# Patient Record
Sex: Female | Born: 1935 | Race: White | Hispanic: No | Smoking: Never smoker
Health system: Southern US, Community
[De-identification: ages and names within clinical notes are randomized; demographics above are authoritative.]

## PROBLEM LIST (undated history)

## (undated) DIAGNOSIS — Z950 Presence of cardiac pacemaker: Secondary | ICD-10-CM

## (undated) DIAGNOSIS — E049 Nontoxic goiter, unspecified: Secondary | ICD-10-CM

## (undated) DIAGNOSIS — M1990 Unspecified osteoarthritis, unspecified site: Secondary | ICD-10-CM

## (undated) DIAGNOSIS — F419 Anxiety disorder, unspecified: Secondary | ICD-10-CM

## (undated) DIAGNOSIS — M810 Age-related osteoporosis without current pathological fracture: Secondary | ICD-10-CM

## (undated) DIAGNOSIS — R011 Cardiac murmur, unspecified: Secondary | ICD-10-CM

## (undated) DIAGNOSIS — Z7901 Long term (current) use of anticoagulants: Secondary | ICD-10-CM

## (undated) DIAGNOSIS — I499 Cardiac arrhythmia, unspecified: Secondary | ICD-10-CM

## (undated) DIAGNOSIS — H353210 Exudative age-related macular degeneration, right eye, stage unspecified: Secondary | ICD-10-CM

## (undated) DIAGNOSIS — Z9289 Personal history of other medical treatment: Secondary | ICD-10-CM

## (undated) DIAGNOSIS — H353120 Nonexudative age-related macular degeneration, left eye, stage unspecified: Secondary | ICD-10-CM

## (undated) DIAGNOSIS — J3489 Other specified disorders of nose and nasal sinuses: Secondary | ICD-10-CM

## (undated) DIAGNOSIS — I359 Nonrheumatic aortic valve disorder, unspecified: Secondary | ICD-10-CM

## (undated) DIAGNOSIS — H3530 Unspecified macular degeneration: Secondary | ICD-10-CM

## (undated) DIAGNOSIS — I495 Sick sinus syndrome: Secondary | ICD-10-CM

## (undated) DIAGNOSIS — K219 Gastro-esophageal reflux disease without esophagitis: Secondary | ICD-10-CM

## (undated) DIAGNOSIS — I340 Nonrheumatic mitral (valve) insufficiency: Secondary | ICD-10-CM

## (undated) DIAGNOSIS — C4490 Unspecified malignant neoplasm of skin, unspecified: Secondary | ICD-10-CM

## (undated) DIAGNOSIS — E039 Hypothyroidism, unspecified: Secondary | ICD-10-CM

## (undated) DIAGNOSIS — M353 Polymyalgia rheumatica: Secondary | ICD-10-CM

## (undated) DIAGNOSIS — I4891 Unspecified atrial fibrillation: Secondary | ICD-10-CM

## (undated) DIAGNOSIS — M069 Rheumatoid arthritis, unspecified: Secondary | ICD-10-CM

## (undated) DIAGNOSIS — G5600 Carpal tunnel syndrome, unspecified upper limb: Secondary | ICD-10-CM

## (undated) DIAGNOSIS — I10 Essential (primary) hypertension: Secondary | ICD-10-CM

## (undated) DIAGNOSIS — E785 Hyperlipidemia, unspecified: Secondary | ICD-10-CM

## (undated) HISTORY — PX: INSERT / REPLACE / REMOVE PACEMAKER: SUR710

## (undated) HISTORY — DX: Other disorders of calcium metabolism: E83.59

## (undated) HISTORY — PX: DILATION AND CURETTAGE OF UTERUS: SHX78

## (undated) HISTORY — PX: CARDIAC CATHETERIZATION: SHX172

## (undated) HISTORY — DX: Nonrheumatic mitral (valve) insufficiency: I34.0

## (undated) HISTORY — DX: Carpal tunnel syndrome, unspecified upper limb: G56.00

## (undated) HISTORY — PX: SKIN CANCER EXCISION: SHX779

## (undated) HISTORY — DX: Unspecified macular degeneration: H35.30

## (undated) HISTORY — PX: TOTAL KNEE ARTHROPLASTY: SHX125

## (undated) HISTORY — PX: FRACTURE SURGERY: SHX138

## (undated) HISTORY — PX: JOINT REPLACEMENT: SHX530

## (undated) HISTORY — DX: Polymyalgia rheumatica: M35.3

## (undated) HISTORY — DX: Age-related osteoporosis without current pathological fracture: M81.0

## (undated) HISTORY — DX: Unspecified atrial fibrillation: I48.91

## (undated) HISTORY — DX: Nontoxic goiter, unspecified: E04.9

## (undated) HISTORY — PX: CARPAL TUNNEL RELEASE: SHX101

## (undated) HISTORY — DX: Hypothyroidism, unspecified: E03.9

## (undated) HISTORY — DX: Sick sinus syndrome: I49.5

## (undated) HISTORY — DX: Unspecified osteoarthritis, unspecified site: M19.90

## (undated) HISTORY — DX: Hyperlipidemia, unspecified: E78.5

## (undated) HISTORY — PX: TONSILLECTOMY AND ADENOIDECTOMY: SUR1326

## (undated) HISTORY — PX: CATARACT EXTRACTION W/ INTRAOCULAR LENS  IMPLANT, BILATERAL: SHX1307

## (undated) HISTORY — DX: Long term (current) use of anticoagulants: Z79.01

## (undated) HISTORY — DX: Essential (primary) hypertension: I10

## (undated) HISTORY — PX: FEMUR FRACTURE SURGERY: SHX633

## (undated) HISTORY — DX: Nonrheumatic aortic valve disorder, unspecified: I35.9

---

## 1998-03-04 ENCOUNTER — Ambulatory Visit (HOSPITAL_COMMUNITY): Admission: RE | Admit: 1998-03-04 | Discharge: 1998-03-04 | Payer: Self-pay | Admitting: Internal Medicine

## 1999-03-25 ENCOUNTER — Other Ambulatory Visit: Admission: RE | Admit: 1999-03-25 | Discharge: 1999-03-25 | Payer: Self-pay | Admitting: Internal Medicine

## 1999-06-01 ENCOUNTER — Ambulatory Visit (HOSPITAL_COMMUNITY): Admission: RE | Admit: 1999-06-01 | Discharge: 1999-06-01 | Payer: Self-pay | Admitting: Internal Medicine

## 1999-06-01 ENCOUNTER — Encounter: Payer: Self-pay | Admitting: Internal Medicine

## 2000-06-01 ENCOUNTER — Encounter: Payer: Self-pay | Admitting: Internal Medicine

## 2000-06-01 ENCOUNTER — Ambulatory Visit (HOSPITAL_COMMUNITY): Admission: RE | Admit: 2000-06-01 | Discharge: 2000-06-01 | Payer: Self-pay | Admitting: Internal Medicine

## 2001-06-05 ENCOUNTER — Encounter: Payer: Self-pay | Admitting: Internal Medicine

## 2001-06-05 ENCOUNTER — Ambulatory Visit (HOSPITAL_COMMUNITY): Admission: RE | Admit: 2001-06-05 | Discharge: 2001-06-05 | Payer: Self-pay | Admitting: Internal Medicine

## 2002-08-01 ENCOUNTER — Encounter: Payer: Self-pay | Admitting: Internal Medicine

## 2002-08-01 ENCOUNTER — Ambulatory Visit (HOSPITAL_COMMUNITY): Admission: RE | Admit: 2002-08-01 | Discharge: 2002-08-01 | Payer: Self-pay | Admitting: Internal Medicine

## 2003-08-21 ENCOUNTER — Encounter: Payer: Self-pay | Admitting: Internal Medicine

## 2003-08-21 ENCOUNTER — Ambulatory Visit (HOSPITAL_COMMUNITY): Admission: RE | Admit: 2003-08-21 | Discharge: 2003-08-21 | Payer: Self-pay | Admitting: Internal Medicine

## 2004-07-21 ENCOUNTER — Emergency Department (HOSPITAL_COMMUNITY): Admission: EM | Admit: 2004-07-21 | Discharge: 2004-07-21 | Payer: Self-pay | Admitting: *Deleted

## 2004-08-26 ENCOUNTER — Ambulatory Visit (HOSPITAL_COMMUNITY): Admission: RE | Admit: 2004-08-26 | Discharge: 2004-08-26 | Payer: Self-pay | Admitting: Internal Medicine

## 2005-08-11 ENCOUNTER — Ambulatory Visit (HOSPITAL_COMMUNITY): Admission: RE | Admit: 2005-08-11 | Discharge: 2005-08-11 | Payer: Self-pay | Admitting: Internal Medicine

## 2006-08-17 ENCOUNTER — Ambulatory Visit (HOSPITAL_COMMUNITY): Admission: RE | Admit: 2006-08-17 | Discharge: 2006-08-17 | Payer: Self-pay | Admitting: General Surgery

## 2007-04-05 ENCOUNTER — Ambulatory Visit: Payer: Self-pay | Admitting: Vascular Surgery

## 2007-04-05 ENCOUNTER — Ambulatory Visit (HOSPITAL_COMMUNITY): Admission: RE | Admit: 2007-04-05 | Discharge: 2007-04-05 | Payer: Self-pay | Admitting: Internal Medicine

## 2007-04-05 ENCOUNTER — Encounter (INDEPENDENT_AMBULATORY_CARE_PROVIDER_SITE_OTHER): Payer: Self-pay | Admitting: Internal Medicine

## 2007-05-02 ENCOUNTER — Encounter (INDEPENDENT_AMBULATORY_CARE_PROVIDER_SITE_OTHER): Payer: Self-pay | Admitting: Internal Medicine

## 2007-05-02 ENCOUNTER — Ambulatory Visit: Payer: Self-pay | Admitting: Vascular Surgery

## 2007-05-02 ENCOUNTER — Ambulatory Visit (HOSPITAL_COMMUNITY): Admission: RE | Admit: 2007-05-02 | Discharge: 2007-05-02 | Payer: Self-pay | Admitting: Internal Medicine

## 2007-08-28 ENCOUNTER — Inpatient Hospital Stay (HOSPITAL_COMMUNITY): Admission: RE | Admit: 2007-08-28 | Discharge: 2007-08-31 | Payer: Self-pay | Admitting: Orthopaedic Surgery

## 2007-08-29 ENCOUNTER — Ambulatory Visit: Payer: Self-pay | Admitting: Physical Medicine & Rehabilitation

## 2008-01-03 ENCOUNTER — Ambulatory Visit (HOSPITAL_COMMUNITY): Admission: RE | Admit: 2008-01-03 | Discharge: 2008-01-03 | Payer: Self-pay | Admitting: Endocrinology

## 2008-03-12 ENCOUNTER — Ambulatory Visit (HOSPITAL_COMMUNITY): Admission: RE | Admit: 2008-03-12 | Discharge: 2008-03-12 | Payer: Self-pay | Admitting: Cardiology

## 2008-03-15 ENCOUNTER — Inpatient Hospital Stay (HOSPITAL_COMMUNITY): Admission: EM | Admit: 2008-03-15 | Discharge: 2008-03-22 | Payer: Self-pay | Admitting: Emergency Medicine

## 2008-03-17 ENCOUNTER — Encounter (INDEPENDENT_AMBULATORY_CARE_PROVIDER_SITE_OTHER): Payer: Self-pay | Admitting: Cardiology

## 2008-07-13 ENCOUNTER — Inpatient Hospital Stay (HOSPITAL_COMMUNITY): Admission: EM | Admit: 2008-07-13 | Discharge: 2008-07-16 | Payer: Self-pay | Admitting: Emergency Medicine

## 2008-07-15 ENCOUNTER — Ambulatory Visit: Payer: Self-pay | Admitting: Physical Medicine & Rehabilitation

## 2008-07-16 ENCOUNTER — Inpatient Hospital Stay (HOSPITAL_COMMUNITY)
Admission: RE | Admit: 2008-07-16 | Discharge: 2008-07-23 | Payer: Self-pay | Admitting: Physical Medicine & Rehabilitation

## 2009-02-03 ENCOUNTER — Encounter: Admission: RE | Admit: 2009-02-03 | Discharge: 2009-02-03 | Payer: Self-pay | Admitting: Diagnostic Radiology

## 2009-02-06 ENCOUNTER — Inpatient Hospital Stay (HOSPITAL_COMMUNITY): Admission: RE | Admit: 2009-02-06 | Discharge: 2009-02-07 | Payer: Self-pay | Admitting: Cardiology

## 2009-07-14 IMAGING — CR DG CHEST 1V PORT
1 series · 1 of 1 positions shown · non-contrast
Comparison: 08/22/2007

CLINICAL DATA: Chest pain and shortness of breath.

PORTABLE CHEST - 1 VIEW

[AP]
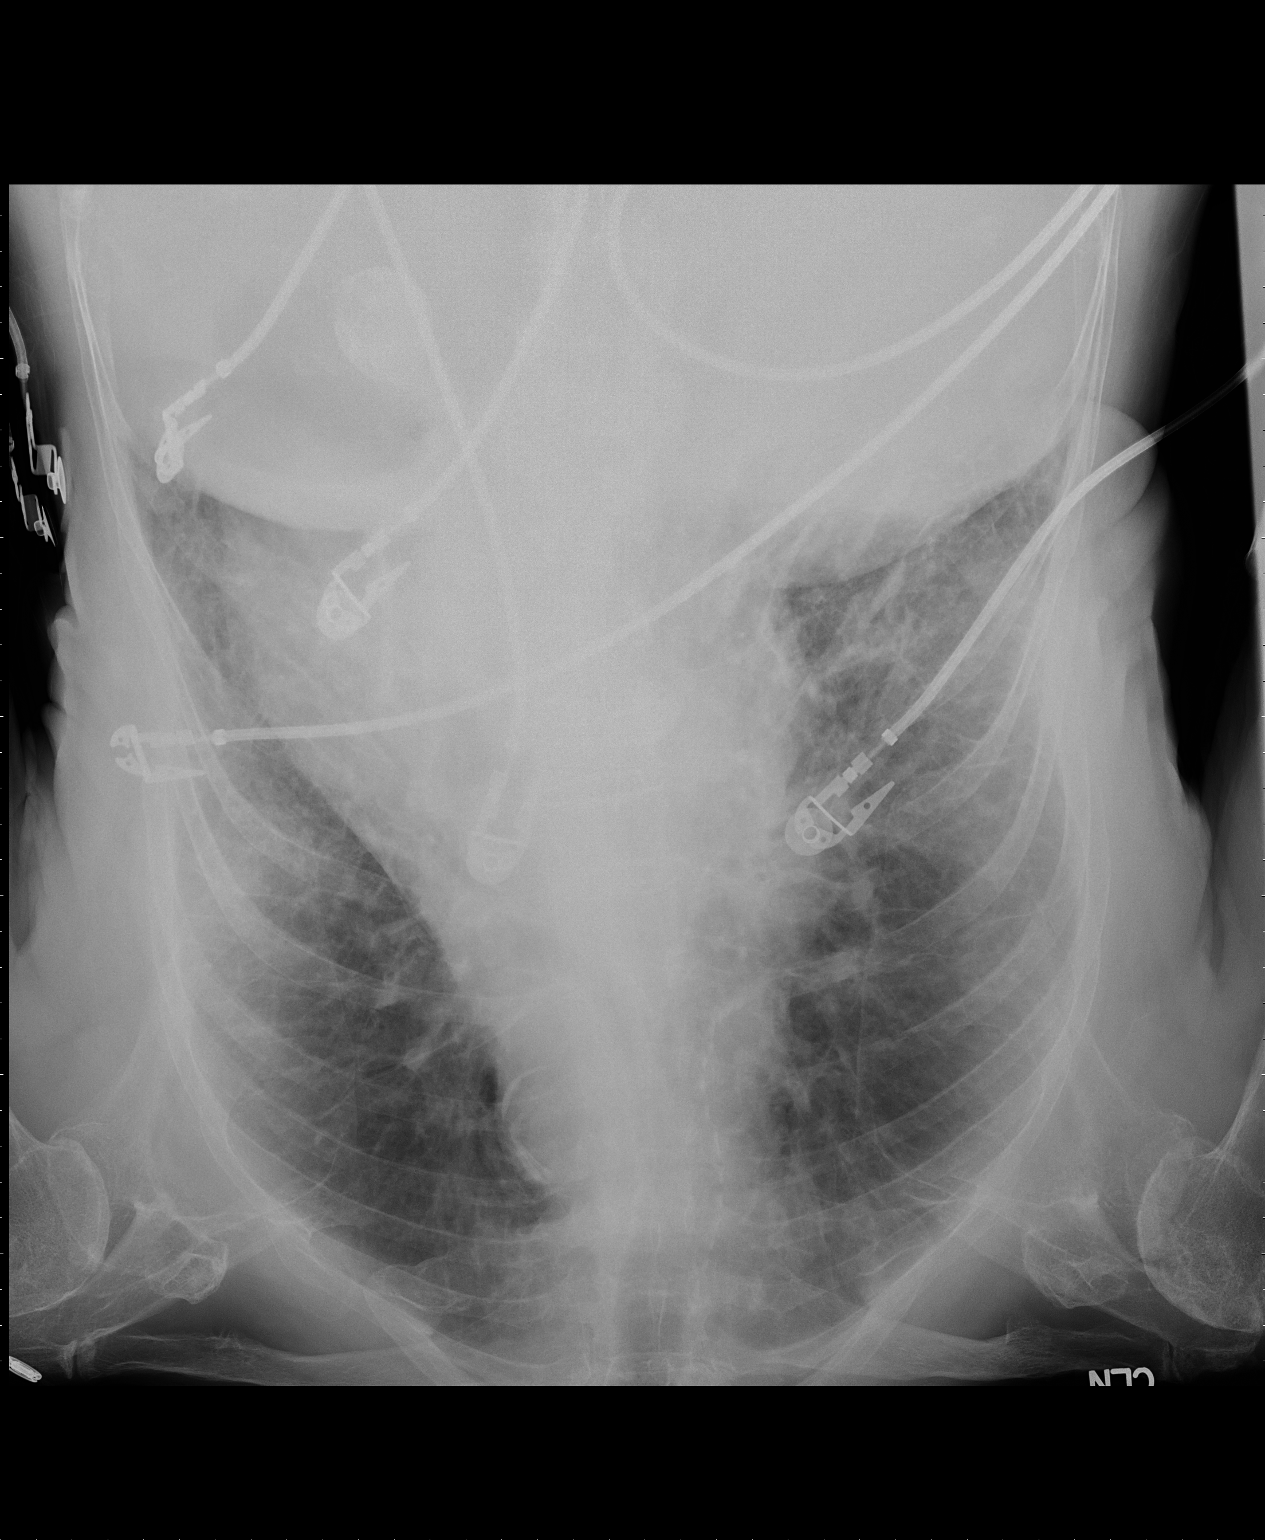

[1 of 1 positions shown; findings below may reference images not displayed]

FINDINGS: The cardiopericardial silhouette is enlarged. Diffuse
interstitial and basilar airspace disease is evident.  Bones are
diffusely demineralized with degenerative changes in each shoulder.
Telemetry leads overlie the chest.

2.7 cm calcification overlies the left upper quadrant.  This is
probably related to splenic artery aneurysm.  Calcified mass in the
tail of the pancreas would also be a consideration.  Less likely
would be a calcified splenic cyst or calcified cyst in the upper
pole of the left kidney.
IMPRESSION: Cardiomegaly.  There has been interval development of diffuse
interstitial and basilar airspace disease.  Pulmonary edema would
be a distinct consideration.

Stable 2.7 cm calcification in the left upper quadrant of the
abdomen.  See report above.

## 2011-02-09 LAB — PROTIME-INR
INR: 1.3 (ref 0.00–1.49)
Prothrombin Time: 16.3 seconds — ABNORMAL HIGH (ref 11.6–15.2)

## 2011-03-15 NOTE — H&P (Signed)
NAME:  Patricia Holt, Patricia Holt             ACCOUNT NO.:  [id number]   MEDICAL RECORD NO.:  [id number]          PATIENT TYPE:  EMS   LOCATION:  MAJO                         FACILITY:  [facility]   PHYSICIAN:  Corinna L. Lendell Caprice, MDDATE OF BIRTH:  03-07-1924   DATE OF ADMISSION:  07/13/2008  DATE OF DISCHARGE:                              HISTORY & PHYSICAL   CHIEF COMPLAINT:  Shortness of breath.   HISTORY OF PRESENT ILLNESS:  Patricia Holt is a pleasant, very active  77 year old white female who was on vacation at the [facility], tripped  and sustained a fracture of her femur.  She had an ORIF of the femur at  the [facility] on Tuesday, I believe, of this week.  She is on  Coumadin for atrial fibrillation, had received FFP and also apparently  packed red blood cells.  She has a history of also coronary artery  disease with ejection fraction of 45%.  She reports that her shortness  of breath began this morning at [facility].  She  had just arrived there yesterday for rehabilitation.  The shortness of  breath is worse supine.  This is new for her.  She had no chest pain.  She has developed a dry cough over the past week or so.  She has had no  fevers or chills.  Her appetite has been good.  She had no chest pain.  She remains on Coumadin.  She has chronic low back pain which  essentially is unchanged from previous.  She does have osteoporosis.  She was sent to the emergency room for workup of her dyspnea.  Her  family also requests that she not go back to [place].   PAST MEDICAL HISTORY:  1. As above, also non-ST segment elevation MI in May 2009.  2. Atrial fibrillation.  3. Hypothyroidism.  4. Hypertension.  5. Right knee replacement by Dr. Jerl Santos in December 2008.   MEDICATIONS:  1. Coumadin 5 mg a day.  2. Multivitamin a day.  3. Boniva monthly.  4. Tylenol as needed.  5. Calcium with vitamin D.  6. Colace.  7. Aspirin 81 mg a day.  8. Lasix 20  mg a day.  9. Synthroid 25 mcg a day.  10.Lisinopril 2.5 mg a day.  11.Metoprolol 25 mg twice a day.  12.Percocet as needed.   No known drug allergies.   SOCIAL HISTORY:  Patient is very active.  She is a retired Engineer, civil (consulting).  She  drinks an occasional glass of wine.  She does not smoke.   FAMILY HISTORY:  Noncontributory.   REVIEW OF SYSTEMS:  CONSTITUTIONAL:  As above.  No weight loss, no  weight gain.  HEENT:  No headache.  No sore throat.  No rhinorrhea.  RESPIRATORY:  As above.  CARDIOVASCULAR:  As above.  GI:  As above.  GU:  She reports difficulty with incontinence since her femur fracture mainly  related to mobility problems.  SKIN:  No rash.  MUSCULOSKELETAL:  As  above.  PSYCHIATRIC:  No depression.  NEUROLOGIC:  No history of stroke  or seizure.  ENDOCRINE:  No diabetes.   PHYSICAL EXAMINATION:  VITAL SIGNS:  Temperature is 97.1, blood pressure  149/74, pulse 79, respiratory rate 18, oxygen saturation 99% on room  air.  IN GENERAL:  The patient is alert white female who appears younger than  her stated age.  HEENT: Normocephalic, atraumatic.  Pupils equal, round, reactive to  light.  Sclerae nonicteric.  Moist mucous membranes.  NECK:  Supple.  No JVD.  No lymphadenopathy.  LUNGS:  Clear to auscultation bilaterally without wheezes, rhonchi or  rales.  She has an occasional dry cough.  CARDIOVASCULAR:  Regular rate  and rhythm without murmurs, gallops or rubs.  ABDOMEN:  Normal bowel sounds, soft, nontender, nondistended.  GU: She has a Foley catheter draining clear yellow urine.  RECTAL:  Deferred.  EXTREMITIES:  Her left leg incisions show staples.  There is no  drainage.  No erythema.  She has trace edema bilaterally.  Pulses are  intact.  NEUROLOGIC:  Alert and oriented.  Cranial nerves and sensorimotor exam  are grossly intact.  PSYCHIATRIC:  Normal affect.   LABORATORIES/IMAGING STUDIES:  CBC is significant for a normal white  count, hemoglobin 10.5, hematocrit  31, normal platelet count.  INR is  2.4; PTT is 48.  Complete metabolic panel is significant for an albumin  of 2.8, otherwise unremarkable.  Point-of-care enzymes negative.  Urinalysis negative.  Chest x-ray:  Two views of the chest show nothing  acute, trace bilateral small pleural effusions, chronic mild  interstitial prominence.  CT angiogram of the chest showed no PE, but  pulmonary parenchymal disease present characterized by interstitial and  ground-glass opacities scattered throughout both upper lobes; nodules  are also present in both upper lobes, largest measuring 7 mm, neoplasm  not excluded, small bilateral pleural effusions, thoracic wedge  compression deformities and L1 compression deformity with possible acute  appearance of the T10 fracture with 1-2 mm of retropulsion, large fat  mass in the right posterior chest wall.  EKG shows normal sinus rhythm  with nonspecific changes.  MRI ordered by the ED physician is pending.  This is of the lower thoracic and I believe upper lumbar spine.   ASSESSMENT/PLAN:  1. Dyspnea:  I suspect this may be mild fluid overload based on      symptoms and her medical history.  I will increase her Lasix and      repeat the chest x-ray tomorrow.  However, also, she does have a      cough, and this could be atypical pneumonia.  I will start her on      Avelox..  Oxygen seems to have helped and I will continue this.      She will also get [employer] as needed.  2. Paroxysmal atrial fibrillation, therapeutic on Coumadin.  3. Recent femur fracture:  The patient reports that she was to be 50%      weightbearing on the left leg.  I will get physical therapy,      occupational therapy, and she requests an alternate placement      option whether it be to a different facility or home with home      health, etc.  She also wishes to get Dr. Nolon Nations input if there      are any questions.  4. Possible T10 fracture on CAT scan:  The patient has no  tenderness      or bruising about the area.  The emergency department physician has  spoken with Dr. Yetta Barre of neurosurgery who has reviewed the film,      and he recommended MRI.  5. Multiple vertebral compression fractures, old.  6. Abnormal CAT scan--see above.  Consider repeat CAT scan of the      chest in 3 months after resolution of her symptoms to further      evaluate the nodules.  7. Hypothyroidism.  8. Coronary artery disease and ejection fraction of 45%.  9. Atrial fibrillation.  10.Osteoporosis.      Corinna L. Lendell Caprice, MD  Electronically Signed     CLS/MEDQ  D:  07/13/2008  T:  07/13/2008  Job:  [id number]   cc:   Thora Lance, M.D.  Jake Bathe, MD  Lubertha Basque. Jerl Santos, M.D.

## 2011-03-15 NOTE — Discharge Summary (Signed)
NAME:  Patricia Holt, Patricia Holt             ACCOUNT NO.:  [id number]   MEDICAL RECORD NO.:  [id number]          PATIENT TYPE:  IPS   LOCATION:  4001                         FACILITY:  [facility]   PHYSICIAN:  Ranelle Oyster, M.D.DATE OF BIRTH:  May 16, 1924   DATE OF ADMISSION:  07/16/2008  DATE OF DISCHARGE:  07/23/2008                               DISCHARGE SUMMARY   DISCHARGE DIAGNOSES:  1. Left femur fracture with open reduction and external fixation      July 08, 2008.  2. Thoracic T10 compression fracture, pain management.  3. Chronic atrial fibrillation, on Coumadin therapy.  4. Hypertension.  5. Enterococcus urinary tract infection.  6. Hypothyroidism.   This is an 77 year old female with history of right total knee  replacement in 2008, maintained on chronic Coumadin for atrial  fibrillation, recent fall while on vacation at the [facility] sustained  a left femur fracture, underwent open reduction and external fixation on  July 08, 2008, at the [employer].  She was later discharged to  [facility]  She developed shortness of  breath, admitted to [facility] on July 13, 2008.  Chest x-  ray negative.  CT of the chest negative for pulmonary emboli.  There  was, however, findings of an acute versus subacute compression fracture  of thoracic T10, no cord compromise noted as well as old healed thoracic  T9 compression fracture.  Hospital course of enterococcus urinary tract  infection, completing antibiotic.  Mobility slow with 50% partial  weightbearing left lower extremity.  Avelox discontinued, changed to  Cipro for urinary tract infection, which was since completed.  She was  admitted for comprehensive rehab program.   PAST MEDICAL HISTORY:  See discharge diagnoses.  No tobacco.  Occasional  alcohol.   ALLERGIES:  None.   SOCIAL HISTORY:  Lives alone, retired Engineer, civil (consulting).  Family will play  assistance as needed.  She lives in a  1-level home with 1 step to entry.   FUNCTIONAL HISTORY:  She was independent and active prior to admission.  Functional status upon admission to rehab was moderate assist 3 feet  with a rolling walker.   MEDICATIONS PRIOR TO ADMISSION:  1. Coumadin 5 mg daily.  2. Multivitamin daily.  3. Boniva monthly.  4. Calcium daily.  5. Aspirin 81 mg daily.  6. Lasix 20 mg daily.  7. Synthroid 25 mcg daily.  8. Lisinopril 2.5 mg daily.  9. Metoprolol 25 mg twice daily.  10.Percocet as needed.   PHYSICAL EXAMINATION:  GENERAL:  Well-appearing female in no acute  distress.  Alert and oriented.  LUNGS: Clear to auscultation.  CARDIAC:  Regular rate and rhythm.  ABDOMEN: Soft, nontender.  Good bowel sounds.  EXTREMITIES:  Left femur site was healing nicely with staples intact.   HOSPITAL COURSE:  The patient was admitted to [facility]  with therapies initiated on a 3-hour daily basis consisting of physical  therapy, occupational therapy, and rehabilitation nursing.  The  following issues were addressed during the patient's rehabilitation  stay.  Pertaining to Mrs. Favaro's left femur fracture, she had  undergone open reduction and external fixation on July 08, 2008.  She was partial weightbearing.  Surgical site healing nicely.  Staples  have been removed.  She would follow up with Dr. Jerl Santos here locally  in [place] as the fall had initially been sustained in the [employer] on vacation.  Conservative care of thoracic T10 compression  fracture.  She was using a Lidoderm patch and oxycodone for pain  management.  She remained on chronic Coumadin for atrial fibrillation,  followed by Dr. Anne Fu.  Her cardiac rate remained well-controlled.  Blood pressures monitored on lisinopril and Lopressor with no  orthostatic changes.  She exhibited no signs of fluid overload.  She had  a history of hypothyroidism, remained on hormone supplement.   Overall, weekly  interdisciplinary team conferences were held to discuss  the patient's estimated length of stay, goals, any barriers to  discharge, and family teaching being completed.  Functionally, she was  ambulating household distances, partial weightbearing with a walker, and  needing minimal assist for lower body activities of daily living.  Home  health therapies would be ongoing as advised.   Latest labs showed an INR of 2.0 with goal of 2.0-3.0.  Latest  hemoglobin 99, hematocrit 29.4, sodium 136, potassium 3.6, BUN 10,  creatinine 0.6.   Discharge medications at the time of dictation included Coumadin latest  dose of 5 mg daily, aspirin 81 mg daily, calcium 500 mg 3 times daily,  Lasix 20 mg daily, lisinopril 2.5 mg daily, Lopressor 25 mg twice daily,  multivitamin daily, calcitonin nasal spray 1 spray daily, alternating  nostrils, Lidoderm patch 5% remove every 12 hours, dispense of 10  patches, Synthroid 25 mcg daily, oxycodone immediate release 5 mg 1-2  tablets every 4 hours needed pain, dispense of 90 tablets.  Diet was  regular.  She was partial weightbearing left lower extremity.  Home  health nurse would be arranged for Coumadin therapy with results to Dr.  Anne Fu.  She would follow up locally with Dr. Jerl Santos of Orthopedic  Services.      Mariam Dollar, P.A.      Ranelle Oyster, M.D.  Electronically Signed   DA/MEDQ  D:  07/23/2008  T:  07/23/2008  Job:  [id number]   cc:   Ranelle Oyster, M.D.  Thora Lance, M.D.  Jake Bathe, MD  Lubertha Basque. Jerl Santos, M.D.

## 2011-03-15 NOTE — H&P (Signed)
NAME:  Patricia Holt, Patricia Holt             ACCOUNT NO.:  [id number]   MEDICAL RECORD NO.:  [id number]          PATIENT TYPE:  IPS   LOCATION:  4001                         FACILITY:  [facility]   PHYSICIAN:  Ranelle Oyster, M.D.DATE OF BIRTH:  Aug 10, 1924   DATE OF ADMISSION:  07/16/2008  DATE OF DISCHARGE:                              HISTORY & PHYSICAL   CHIEF COMPLAINT:  Back and left hip pain.   HISTORY OF PRESENT ILLNESS:  This is a pleasant 77 year old white female  with right total knee replacement in 2008, on Coumadin for chronic  atrial fibrillation who presented with a recent fall while on vacation  at the [employer].  She sustained a left femur fracture and underwent  ORIF on July 08, 2008, at [facility].  She was discharged to  [facility] where she developed shortness of  breath and was admitted to [facility]  CT was negative for  pulmonary and pulmonary embolism, although she did have thoracic spine  findings notable for acute or subacute compression fracture at T10.  No  neurological compromise was seen.  The patient's urine was positive for  100,000 enterococcus and placed on Avelox at admission.  The patient has  had slow progress overall due to pain issues and weakness.  Avelox was  changed to Cipro for UTI treatments.  The patient was examined by Rehab  Consult Service per Dr. Jone Baseman request and rehab felt she was  appropriate for admission for comprehensive rehab.   REVIEW OF SYSTEMS:  Notable for low back pain and history of  palpitations, although nothing new.  Bowel continence has been intact.  She does report occasional urge incontinence.  The patient denies  shortness of breath.  Appetite has been good.  She slept well last night  after using pain medicines.  Full reviews in the written history and  physical.   PAST MEDICAL HISTORY:  1. CAF.  2. CAD with ejection fraction of 45%.  3. Hypertension.  4. Hypothyroidism.  5.  Osteoporosis.  6. Left femur fracture as noted above.  7. Right total knee replacement in 2008.   SOCIAL HISTORY:  She occasionally drinks and does not smoke.   FAMILY HISTORY:  Noncontributory.   SOCIAL HISTORY:  The patient lives alone, is a retired Engineer, civil (consulting).  Family  will assist upon discharge as needed.  She has a one-level house with  one step to enter.   FUNCTIONAL HISTORY:  The patient was independent and active prior to  arrival.  Currently, she is mod assist 5 feet with only walker and min  assist with bed mobility.  She is min to mod assist with ADLs.   ALLERGIES:  None.   HOME MEDICATIONS:  1. Coumadin 5 mg daily.  2. Multivitamins daily.  3. Boniva q. month.  4. Calcium daily.  5. Aspirin 81 mg daily.  6. Lasix 20 mg daily.  7. Synthroid 25 mcg daily.  8. Lisinopril 2.5 daily.  9. Metoprolol 25 mg b.i.d.  10.Percocet p.r.n.   LABORATORY DATA:  Hemoglobin 9.7, white count 6000, and platelet  348,000.  Sodium 138, potassium 3.8, BUN and creatinine 7 and 0.6.   PHYSICAL EXAMINATION:  VITAL SIGNS:  Blood pressure is 143/79, pulse 84,  respiratory rate 18, and temperature 98.1.  GENERAL:  The patient is pleasant, alert, and oriented x3.  She is lying  in bed comfortably.  Pupils equal, round, and reactive to light.  EAR, NOSE, AND THROAT:  Notable for pink mucosa and reasonably good  dentition.  NECK:  Supple without JVD or lymphadenopathy.  CHEST:  Clear to auscultation bilaterally without wheezes, rales, or  rhonchi.  HEART:  Regular rate and rhythm.  No murmur, rubs, or gallops were  appreciated.  EXTREMITIES:  No clubbing, cyanosis, or edema.  ABDOMEN:  Soft and nontender.  SKIN:  Notable for intact left hip wounds, which were well-approximated.  She had a few bruises otherwise, but nothing significant note.  She had  an area of bogginess on the left heel with mild tenderness, but no  breakdown.  NEUROLOGIC:  Cranial nerves II-XII are intact.  Her reflex is  1+.  Sensation is normal.  Judgment, orientation, memory, and mood were all  within functional limits.  EXTREMITIES:  Strength is 5/5 in the upper extremities.  Left lower  extremities; 1/5 at the hip, 2/5 in the knee, 3+ to 4/5 at the ankle.  She is 3+ to 4/5, proximal right lower extremity to 5/5, distally.   ASSESSMENT AND PLAN:  1. Functional deficits secondary to left femur fracture, status post      open reduction and internal fixation on July 08, 2008.  Course      was complicated by T10 compression fracture and urinary tract      infection.  The patient is admitted to receive collaborative      interdisciplinary care between the physiatrist rehab nursing staff      and therapy team.  The patient's level of medical complexity and      substantial therapy needs in context with the medical necessity      cannot be provided  in a lesser intensity of care (the patient      already had complications at a local skilled nursing facility).      Physiatrist will provide 24-hour management of listed medical needs      as well as oversight of therapy, plan/treatment and provide      guidance as appropriate regarding the interaction of the tube.  24-      hour rehab nursing will assist in the management of the patient's      bowel and bladder continence.  Follow PVRs for history of urge      incontinence.  Also, RN will assist in pain management and safety      assessment.  Nurses will also help integrate therapy concepts,      techniques, and education.  Physical therapy will assess and treat      for balance needs in the setting of her new left hip fracture.  The      patient will wear a lumbar and thoracic corset while up for      comfort.  She will need to work on posture as well.  Occupational      therapy will assess and treat for self care and safety matters as      well as appropriate equipment.  Case Management/social worker will      assess for psychosocial needs and discharge  planning.  Team      conference will be  held weekly to establish goals, assess progress,      and determining barriers of discharge.  Rehab goals are modified      independent and estimated length of stay is 7+ days.  Prognosis      good.  2. Pain control with oxycodone IR p.r.n.  She also may use Tylenol.      Add Lidoderm patches for localized thoracic pain.  Use corset as      noted above.  Also, consider a light muscle relaxants for spasm.  3. Chronic atrial fibrillation:  Continue Coumadin.  4. Anemia:  We will followup serial CBCs while on Coumadin and the      supplement with iron.  5. Osteoporosis:  Continue Miacalcin as well as Os-Cal for acute care.  6. Blood pressure, heart rate control:  Continue lisinopril and      Lopressor for now.  7. Urinary tract infection:  Continue Cipro 200 mg b.i.d. for five      days.  8. Hypothyroidism:  Continue Synthroid.      Ranelle Oyster, M.D.  Electronically Signed     ZTS/MEDQ  D:  07/16/2008  T:  07/16/2008  Job:  [id number]

## 2011-03-15 NOTE — H&P (Signed)
NAME:  Patricia Holt, Patricia Holt             ACCOUNT NO.:  [id number]   MEDICAL RECORD NO.:  [id number]          PATIENT TYPE:  INP   LOCATION:  3707                         FACILITY:  [facility]   PHYSICIAN:  Jake Bathe, MD      DATE OF BIRTH:  [birth year]   DATE OF ADMISSION:  03/15/2008  DATE OF DISCHARGE:                              HISTORY & PHYSICAL   PRIMARY CARE PHYSICIAN:  Thora Lance, M.D.   CARDIOLOGIST:  Mark C. Skains, M.D.   CHIEF COMPLAINT:  Chest pain and shortness of breath.   HPI:  An 77 year old female who at 2 a.m. this morning, had trouble  breathing, felt wheezing and audible, and substernal chest pressure up  to 7/10 waxing and waning with radiation to her shoulder with some mild  dizziness, diaphoresis, and nausea.  Her neighbor Dot came to help her.  She did take aspirin.  Finally, EMS was called and took her to [facility] for further evaluation.  Symptoms were  relieved slightly with nitroglycerin.  There were no exacerbating  factors.  She thought originally that she was developing a pneumonia  because she did have some chills with her shortness of breath.   Earlier this week, she underwent a cardioversion for atrial  fibrillation, which was successful after greater than 4 weeks of INR  therapy greater than 2.  Prior echocardiogram showed normal ejection  fraction.   Yesterday, she felt recently well and actually was gardening.  She  denies any bleeding, any syncope, no palpitations, no joint pain, or no  rashes.   PAST MEDICAL HISTORY:  1. Hypothyroidism.  2. Atrial fibrillation, status post cardioversion.  Atrial      fibrillation began approximately 2 months ago following      colonoscopy.  No prior history of coronary artery disease.  3. Osteoporosis.  4. Hypertension.   PAST MEDICAL HISTORY:  She had left knee surgery.   ALLERGIES:  No known drug allergies.   MEDICATIONS:  1,  Hydrochlorothiazide 25 mg once a day.  1.  Metoprolol 25 mg twice a day.  2. Synthroid 25 mcg once a day.  3. Boniva 150 mg a week.  4. Warfarin 5 mg as directed by Dr. Riki Rusk.  5. Multivitamin, stool softener, calcium, and vitamin D.   FAMILY HISTORY:  Currently noncontributory, but her parents lived to  their 7s.   SOCIAL HISTORY:  She is a retired Engineer, civil (consulting), enjoys Multimedia programmer and quite  active.  No tobacco or alcohol use recently, since been on Coumadin.  She used to enjoy one glass of wine with her neighbor.   REVIEW OF SYSTEMS:  Headache severe this a.m.  Otherwise, all other  review of systems negative unless explained above.   PHYSICAL EXAMINATION:  VITAL SIGNS:  Blood pressure currently 139/78,  originally taken by her neighbor was 160/90, pulse 68 and regular,  respirations 17, and satting 97% on room air.  GENERAL:  Alert, oriented x3 in no acute distress, pleasant, and lying  comfortably in bed.  EYES:  Well-perfused conjunctivae.  EOMI.  No scleral icterus.  NECK:  JVD noted to mid jaw.  No carotid bruits.  Moist mucous  membranes.  CARDIOVASCULAR:  Regular rate and rhythm with no murmurs, rubs, or  gallops.  LUNGS:  Rales heard at both bases, otherwise normal respiratory effort.  ABDOMEN:  Soft, nontender, normoactive bowel sounds, and mildly  distended.  No bruits.  EXTREMITIES:  No clubbing, cyanosis, or edema.  Normal distal pulses.  NEUROLOGIC:  Nonfocal.  No tremors.  SKIN:  Warm, dry, and intact.  No rashes noted.   LABORATORY DATA:  EKG in the emergency department shows sinus rhythm  rate 73 with new right bundle branch block and biphasic T-waves noted in  V2.  Prior ECG after cardioversion showed sinus rhythm with a narrow  complex and no T-wave changes.  CK was 129, MB 2.8, and troponin mildly  elevated at 0.18.  BNP was 390, elevated.  Hemoglobin 13.3, hematocrit  39, PT 30.9, and INR 2.8.  Sodium 133, potassium 4.3, chloride 95, BUN  17, and creatinine 0.8.  Glucose mildly elevated at 139.  Her chest  x-  ray did demonstrate cardiomegaly, which does appear to be new and some  evidence of interstitial edema.   ASSESSMENT AND PLAN:  An 77 year old female with new-onset chest pain,  shortness of breath, new cardiomegaly seen on chest x-ray, status post  recent cardioversion on Mar 12, 2008, for atrial fibrillation.  1. Chest pain/unstable angina - concerning.  We will place on heparin,      aspirin, beta-blocker, nitroglycerin, oxygen, and we will also      start low-dose ACE inhibitor, and lisinopril 5 mg once a day.  I am      aware that her INR is therapeutic currently and will still use      heparin with a lower dose nomogram given its separate pathway for      acute coronary syndrome.  We will monitor closely for any signs of      bleeding.  Positive troponin noted.  Of course, this could be      secondary to recent cardioversion.  CK and MB are both reassuring.   Depending on her continued clinical status, she may require cardiac  catheterization for further risk stratification.  She has not had a  prior stress test.  We will stop Coumadin and allow INR to decrease.  1. Cardiomegaly - newly seen on chest x-ray.  Could this be stunning      from cardioversion or secondary to coronary artery disease.  We      will check echocardiogram.  We are starting ACE inhibitor for      afterload reduction.  Continue beta-blocker.  2. Abnormal ECG - new right bundle branch block and biphasic T-wave.  3. Hypothyroidism - we will check thyroid-stimulating hormone.      Continue Synthroid.  4. Hypertension.  We will monitor closely.  I am substituting Lasix 40      mg IV twice a day for her hydrochlorothiazide given her      cardiomegaly and rales in her lungs as well as jugular venous      distention.  I am also starting low-dose Crestor 5 mg at night for      possible acute coronary syndrome.      Jake Bathe, MD  Electronically Signed     MCS/MEDQ  D:  03/15/2008  T:  03/16/2008   Job:  [id number]   cc:   Thora Lance, M.D.

## 2011-03-15 NOTE — Op Note (Signed)
NAME:  Patricia Holt, Patricia Holt             ACCOUNT NO.:  [id number]   MEDICAL RECORD NO.:  [id number]          PATIENT TYPE:  INP   LOCATION:  2550                         FACILITY:  [facility]   PHYSICIAN:  Lubertha Basque. Dalldorf, M.D.DATE OF BIRTH:  Sep 26, 1924   DATE OF PROCEDURE:  08/28/2007  DATE OF DISCHARGE:                               OPERATIVE REPORT   PREOPERATIVE DIAGNOSIS:  Right knee degenerative arthritis.   POSTOPERATIVE DIAGNOSIS:  Right knee degenerative arthritis.   PROCEDURE:  Right total knee replacement.   ANESTHESIA:  General and block.   ATTENDING SURGEON:  Marcene Corning, M.D.   ASSISTANT:  Lindwood Qua, PA.   INDICATIONS FOR PROCEDURE:  The patient is an 77 year old woman with a  long history of degenerative arthritis and deformity of the right knee.  This has persisted despite oral anti-inflammatories and injections.  She  persists with pain which limits her ability to rest and be active and  play golf and she is offered a knee replacement at this point.  Informed  operative consent was obtained after discussing the of possible  complications of reaction to anesthesia, infection, neurovascular  injury, DVT, PE, and death.   SUMMARY OF FINDINGS AND PROCEDURE:  Under general anesthesia and block  through a standard anterior approach, a right total knee replacement was  performed.  We corrected her significant valgus deformity and addressed  her problem with a cemented DePuy LCS system.  She had advanced  degenerative change in all three compartments and good bone quality.  We  used a standard plus femur, 4 tibia, 10 mm deep dish spacer, 35 mm all  polyethylene patella.  Antibiotic was included in the cement.  Bryna Colander assisted throughout and was invaluable to completion of the  case in that he helped position and retract while I performed the  procedure.  He also closed simultaneously to help minimize OR time.   DESCRIPTION OF PROCEDURE:  The patient was  taken to the operating suite  where general anesthetic was applied without difficulty.  She was given  a block in the preanesthesia area.  She was positioned supine, prepped  and draped in normal sterile fashion.  After administration of IV  Kefzol, the right leg was elevated, exsanguinated, and tourniquet  inflated about the thigh.  A longitudinal anterior incision made with  dissection down to the extensor mechanism.  All appropriate anti-  infective measures used including preoperative IV Kefzol, Betadine  impregnated drape, and closed hooded exhaust systems for each member of  surgical team.  A medial parapatellar incision was made and the kneecap  was flipped and the knee flexed.  Some residual meniscal tissues were  removed along with the ACL and PCL and large osteophytes.  An  extramedullary guide was placed in the tibia to make a cut on the  proximal surface.  This was done with a slight posterior tilt.  The  femur was then addressed with an intramedullary guide making anterior  and posterior cuts creating a flexion gap of 10 mm.  A second  intramedullary guide was placed in the femur to make a  distal cut  creating an equal extension gap of 10 mm and also correcting the  significant valgus deformity.  Mild soft tissue release was required off  the proximal portion of the knee joint off the femur.  The femur sized  to a standard plus and the tibia to a 4.  Appropriate guides were placed  and utilized.  The patella was cut down in thickness by about 8 or 9 mm  to 15 and sized to 35 with the appropriate guide placed and utilized.  A  trial reduction was done.  The patella tracked well without any sort of  lateral release.  The knee easily came to slight hyperextension and  flexed well.  Trial components were removed followed by pulsatile lavage  of all three cut bony surfaces.  Cement was mixed including 1.5 grams a  Zinacef and this was pressurized onto all the bones.  The  aforementioned  DePuy LCS components then placed.  Excess cement was trimmed and  pressure was held on the component until the cement had hardened.  The  tourniquet was deflated and initially there was some significant  bleeding.  We packed the wound for two or three minutes and once this  packing was removed there was no significant bleeding.  The wound was  irrigated.  Drain was placed exiting superolaterally.  We reapproximated  the extensor mechanism with #1 Vicryl interrupted fashion followed by  subcutaneous reapproximation with 0 and 2-0 undyed Vicryl and skin  closure with staples.  The knee easily flexed up to 120 against gravity  at the end of the closure.  Adaptic was applied followed by dry gauze  and loose Ace wrap.  Estimated blood loss was 200 mL and intraoperative  fluids can be obtained from Anesthesia records as can accurate  tourniquet time which was under an hour.   DISPOSITION:  The patient was extubated in the operating room and taken  to the recovery in stable addition.  She was to be admitted for the  orthopaedic surgery service for appropriate postop care to include  perioperative antibiotics, Coumadin plus Lovenox for DVT prophylaxis.      Lubertha Basque Jerl Santos, M.D.  Electronically Signed     PGD/MEDQ  D:  08/28/2007  T:  08/28/2007  Job:  [id number]

## 2011-03-15 NOTE — Op Note (Signed)
NAME:  Patricia Holt, Patricia Holt NO.:  [id number]   MEDICAL RECORD NO.:  [id number]          PATIENT TYPE:  OIB   LOCATION:  2854                         FACILITY:  [facility]   PHYSICIAN:  Jake Bathe, MD      DATE OF BIRTH:  11/18/1923   DATE OF PROCEDURE:  03/12/2008  DATE OF DISCHARGE:  03/12/2008                               OPERATIVE REPORT   INDICATIONS:  An 77 year old female with new-onset atrial fibrillation  with greater than 4 weeks of therapeutic INRs on Coumadin therapy here  for DC cardioversion.   PROCEDURE DETAILS:  Informed consent was obtained.  Risk of stroke,  heart attack, and death were related to the patient.  The patient was  placed in a supine position.  Pads were placed on the anterior and  posterior chest wall.  Dr. Sondra Come assisted with anesthesia.  After  sedation was obtained, synch shock at 200 biphasic joules was  administered.  One single shock successfully cardioverted her to sinus  rhythm.  P-wave clearly seen on telemetry strip preceding each QRS  complex.  Pre-procedure ECG demonstrated AFib, postprocedure ECG  demonstrates sinus rhythm.  The patient tolerated the procedure well.  Will follow up in  3 weeks.  Continue Coumadin.      Jake Bathe, MD  Electronically Signed     MCS/MEDQ  D:  03/12/2008  T:  03/13/2008  Job:  [id number]   cc:   Thora Lance, M.D.

## 2011-03-18 NOTE — Discharge Summary (Signed)
NAME:  Patricia Holt, Patricia Holt NO.:  [id number]   MEDICAL RECORD NO.:  [id number]          PATIENT TYPE:  INP   LOCATION:  3730                         FACILITY:  [facility]   PHYSICIAN:  Thora Lance, M.D.  DATE OF BIRTH:  February 23, 1924   DATE OF ADMISSION:  07/13/2008  DATE OF DISCHARGE:  07/16/2008                               DISCHARGE SUMMARY   REASON FOR ADMISSION:  [name] is an 77 year old white female who  sustained a fracture of her femur and had an ORIF of the femur at the  [facility] several days prior to admission.  She had arrived in  [place] the day before admission for rehabilitation and had been  admitted to [facility].  She developed shortness  of breath, which was worse on supine and some dry cough for a week and  presented to the emergency room with these complaints.   SIGNIFICANT FINDINGS:  VITAL SIGNS:  Temperature 97.1 blood pressure  149/74, heart rate 79, respirations 18.  NECK:  No JVD.  LUNGS:  Clear.  HEART:  Regular rate and rhythm without murmur, gallop, or rub.  ABDOMEN:  Benign.  EXTREMITIES:  No edema.   LABORATORY DATA:  CBC; hemoglobin 10.5, platelets normal, INR 2.4,  albumin 2.8, otherwise complete metabolic profile normal.  Urinalysis  was negative.  Chest x-ray showed trace bilateral pleural effusions and  chronic mild interstitial prominence.  CT scan and angiogram of chest  showed no pulmonary embolus, but there was pulmonary parenchymal disease  characterized by interstitial ground glass opacities scattered  throughout the upper lobes and nodules present in both lobes and small  bilateral pleural effusions.  Wedge compression deformities at L1 and  possibly acute T10 were noted.   HOSPITAL COURSE:  The patient was admitted for dyspnea.  Her dyspnea  resolved and did not recur during the hospitalization.  There is a  question that she have mild pneumonia and she was treated with p.o.  Avelox.  She did complain of back pain, which was related to a T10  compression fracture.  An MRI of the C-spine confirmed an acute T10  compression.  We decided to treat this conservatively with pain  medications.  The patient remained asymptomatic throughout the  hospitalization and was treated for 5 days of Avelox for possible  pneumonia and this was discontinued.  She was transferred to  Rehabilitation Medicine in good condition.   DISCHARGE DIAGNOSES:  1. Dyspnea.  2. Pneumonia.  3. T10 compression fracture.  4. Recent femur fracture.  5. Coronary artery disease.  6. Atrial fibrillation.  7. Hypothyroidism.  8. Hypertension.   PROCEDURES:  1. CT scan of the chest.  2. MRI of the lumbar and thoracic spine.   DISCHARGE MEDICATIONS:  1. Coumadin adjusted dose.  2. Boniva every month.  3. Calcium plus vitamin D b.i.d.  4. Aspirin 81 mg a day.  5. Lasix 20 mg a day.  6. Synthroid 25 mcg a day.  7. Lisinopril 2.5 mg a day.  8. Metoprolol 25 mg b.i.d.   DISPOSITION:  Discharged to inpatient rehabilitation.  Followup after  rehabilitation discharge.  Follow with Dr. Valentina Lucks.   DIET:  Low-sodium diet.   ACTIVITY:  As per Physical Therapy.           ______________________________  Thora Lance, M.D.     JJG/MEDQ  D:  08/22/2008  T:  08/22/2008  Job:  [id number]

## 2011-03-18 NOTE — Discharge Summary (Signed)
NAME:  Patricia Holt, Patricia Holt             ACCOUNT NO.:  [id number]   MEDICAL RECORD NO.:  [id number]          PATIENT TYPE:  INP   LOCATION:  5038                         FACILITY:  [facility]   PHYSICIAN:  Lubertha Basque. Dalldorf, M.D.DATE OF BIRTH:  [birth year]   DATE OF ADMISSION:  08/28/2007  DATE OF DISCHARGE:  08/31/2007                               DISCHARGE SUMMARY   ADMITTING DIAGNOSIS:  1. Right knee end-stage degenerative joint disease.  2. Hypothyroidism.  3. Hypertension.   DISCHARGE DIAGNOSIS:  1. Right knee end-stage degenerative joint disease.  2. Hypothyroidism.  3. Hypertension.  4. Blood loss anemia.   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  Her x-rays reveal end-stage  DJD bone-on-bone arthritis.  She has failed corticosteroid injections,  oral anti-inflammatory medicines.  She is a very avid golfer and likes  to be active, so we have discussed treatment options with her, that  being total knee replacement.   PERTINENT LABORATORY AND X-RAY FINDINGS:  EKG:  Normal sinus rhythm.  WBCs 5.4, hemoglobin 8.5 with a drop from 14.5, hematocrit 25.1,  platelets of 197.  Serial INRs were done per low-dose Coumadin protocol.  Range for INR wanted between 2 and 3.  Sodium 139, potassium 3.2 with a  drop from 4, glucose 95, BUN 4, creatinine 0.56.   COURSE IN THE HOSPITAL:  She was admitted postoperatively, was on a PCA  Dilaudid pump.  Diet to advance as tolerated.  IV antibiotics, Ancef 1  gm q.8 hours x3 doses.  Coumadin and Lovenox protocol, DVT prophylaxis.  Percocet for pain, ferrous sulfate for iron supplementation, and then  incentive spirometry, knee-high TEDs and CPM machine were also used.  She could be weightbearing as tolerated with help of physical therapy  the first day postop.   PHYSICAL EXAMINATION:  VITAL SIGNS:  Temperature 98, blood pressure  113/61, hemoglobin 9.8, INR 1.  LUNGS:  Clear in  all fields.  ABDOMEN:  Soft.  EXTREMITIES:  Knee:  Dressing right side with minimal drainage.  CPM was  active at 0 to 50.  GENITOURINARY:  Foley catheter was in place.   The first day postop, discontinued her PCA and heparin locked her IV.  She worked well with physical therapy, got out of bed, and was able to  make good headway.  Dressing was changed second day postop.  Drain was  pulled with no sign of skin or wound infection.  The next day, August 31, 2007, she was able to be discharged home.   CONDITION ON DISCHARGE:  Improved.   FOLLOWUP:  She will remain on a heart-healthy diet.  Change her dressing  daily.  Follow up with Dr. Jerl Santos in 10 days, [phone].  Weightbearing  as tolerated with walker, crutches.  Continue on ferrous sulfate 3 times  a day as well.  She was also supplemented with KCl.  Home physical  therapy and blood draws from [facility].  If there are any signs  of infections, she is to call our office.  She will return  to 10 days.   Additional medications will be Synthroid 0.25 mcg, hydrochlorothiazide  25 mg one a day, and then her monthly Boniva.      Patricia Holt, P.A.      Lubertha Basque Jerl Santos, M.D.  Electronically Signed    MC/MEDQ  D:  10/02/2007  T:  10/02/2007  Job:  [id number]

## 2011-03-18 NOTE — Discharge Summary (Signed)
NAME:  Patricia Holt, Patricia Holt             ACCOUNT NO.:  [id number]   MEDICAL RECORD NO.:  [id number]          PATIENT TYPE:  INP   LOCATION:  3707                         FACILITY:  [facility]   PHYSICIAN:  Jake Bathe, MD      DATE OF BIRTH:  Apr 18, 1924   DATE OF ADMISSION:  03/15/2008  DATE OF DISCHARGE:  03/22/2008                               DISCHARGE SUMMARY   DISCHARGE DIAGNOSES:  1. Atrial fibrillation, converted to normal sinus rhythm.  2. Long-term Coumadin use.  3. Non-ST segment elevated myocardial infarction.  4. Left ventricular systolic dysfunction, ejection fraction 45%.  5. Hypokalemia, repleted.   HOSPITAL COURSE:  Patricia Holt is an 77 year old female who was at 2  a.m. on the date of admission having trouble breathing.  She felt  wheeziness and substernal chest pressure waxing and waning with  radiation to her shoulder with some accompanying dizziness, diaphoresis,  and nausea.  Her neighbor came to help her and she finally called EMS  after taking an aspirin.  Symptoms were slightly relieved with  nitroglycerin.  She was admitted to the hospital with unstable angina  starting for underlying coronary disease.  She has a history of atrial  fibrillation and has been on Coumadin long-term.  Atrial fibrillation  began approximately 2 months prior to this admission following a  colonoscopy.  She has been cardioverted.   Lab studies during her hospital stay, her Protime on discharge was 25.9  with INR of 2.3, maximum troponin of 1.41, CK-MB 126/6.6.  She was  placed on IV heparin for a short period while we held her Coumadin.   She underwent cardiac catheterization on Mar 17, 2008.  She was found to  have mild nonobstructive disease.  Please see catheterization report.  In the end, we felt the patient should be treated medically by utilizing  beta blockers and ACE inhibitors.  We did go ahead and performed a 2-D  echo while she was here and this showed an EF of 45%-50%  with moderate  hypokinesis of the mid distal anterior septal wall.  Both atrium were  dilated.   Once the patient had had her cardiac catheterization, we bridged her  with heparin until her INR was therapeutic.  She was finally discharged  to home on Mar 22, 2008.   DISCHARGE MEDICATIONS:  1. Lasix 40 mg 1 tablet daily.  2. Potassium 10 mEq 1 tablet daily.  3. Enteric-coated aspirin 81 mg a day.  4. Lopressor 25 mg p.o. b.i.d.  5. Lisinopril 5 mg a day.  6. Synthroid 25 mcg a day.  7. Boniva as before.  8. Vitamins as before.  9. Coumadin 5 mg a day on Monday, Wednesday, and Friday alternating      with 7.5 mg of Coumadin on Tuesday, Thursday, Saturday, and Sunday.  10.She is to stop taking hydrochlorothiazide.   DIET:  She is to remain on a low-sodium heart-healthy diet.   ACTIVITY:  Increase her activity slowly.   FOLLOWUP:  Follow up with Riki Rusk in our Coumadin Clinic on Mar 26, 2008  at 12:15 p.m. and then follow  up with Dr. Anne Fu on April 04, 2008 at 2:15  p.m.      Guy Franco, P.A.      Jake Bathe, MD  Electronically Signed    LB/MEDQ  D:  04/22/2008  T:  04/23/2008  Job:  [id number]   cc:   Thora Lance, M.D.

## 2011-07-27 LAB — BASIC METABOLIC PANEL
BUN: 10
BUN: 11
BUN: 12
BUN: 17
BUN: 19
BUN: 26 — ABNORMAL HIGH
CO2: 29
CO2: 32
CO2: 33 — ABNORMAL HIGH
CO2: 34 — ABNORMAL HIGH
CO2: 34 — ABNORMAL HIGH
CO2: 37 — ABNORMAL HIGH
Calcium: 8.8
Calcium: 9
Calcium: 9.2
Calcium: 9.3
Calcium: 9.5
Calcium: 9.7
Chloride: 100
Chloride: 101
Chloride: 102
Chloride: 103
Chloride: 98
Chloride: 98
Creatinine, Ser: 0.71
Creatinine, Ser: 0.76
Creatinine, Ser: 0.78
Creatinine, Ser: 0.8
Creatinine, Ser: 0.82
Creatinine, Ser: 0.84
GFR calc Af Amer: >60
GFR calc Af Amer: >60
GFR calc Af Amer: >60
GFR calc Af Amer: >60
GFR calc Af Amer: >60
GFR calc Af Amer: >60
GFR calc non Af Amer: >60
GFR calc non Af Amer: >60
GFR calc non Af Amer: >60
GFR calc non Af Amer: >60
GFR calc non Af Amer: >60
GFR calc non Af Amer: >60
Glucose, Bld: 114 — ABNORMAL HIGH
Glucose, Bld: 85
Glucose, Bld: 89
Glucose, Bld: 90
Glucose, Bld: 90
Glucose, Bld: 96
Potassium: 3.3 — ABNORMAL LOW
Potassium: 3.4 — ABNORMAL LOW
Potassium: 3.4 — ABNORMAL LOW
Potassium: 3.9
Potassium: 4.2
Potassium: 4.2
Sodium: 138
Sodium: 139
Sodium: 140
Sodium: 141
Sodium: 142
Sodium: 143

## 2011-07-27 LAB — CBC
HCT: 33.8 — ABNORMAL LOW
HCT: 36.2
HCT: 36.7
HCT: 37.4
HCT: 37.8
HCT: 38.2
HCT: 38.9
Hemoglobin: 11.6 — ABNORMAL LOW
Hemoglobin: 12.1
Hemoglobin: 12.3
Hemoglobin: 12.6
Hemoglobin: 12.9
Hemoglobin: 12.9
Hemoglobin: 13.4
MCHC: 33.4
MCHC: 33.5
MCHC: 33.5
MCHC: 33.6
MCHC: 34.4
MCHC: 34.4
MCHC: 34.4
MCV: 88.1
MCV: 88.1
MCV: 88.2
MCV: 88.4
MCV: 88.5
MCV: 88.5
MCV: 88.6
Platelets: 234
Platelets: 236
Platelets: 243
Platelets: 247
Platelets: 256
Platelets: 258
Platelets: 268
RBC: 3.83 — ABNORMAL LOW
RBC: 4.09
RBC: 4.16
RBC: 4.22
RBC: 4.27
RBC: 4.32
RBC: 4.41
RDW: 15.1
RDW: 15.3
RDW: 15.3
RDW: 15.3
RDW: 15.3
RDW: 15.5
RDW: 15.8 — ABNORMAL HIGH
WBC: 5.2
WBC: 5.6
WBC: 5.6
WBC: 5.8
WBC: 5.9
WBC: 6.4
WBC: 6.9

## 2011-07-27 LAB — COMPREHENSIVE METABOLIC PANEL
ALT: 47 — ABNORMAL HIGH
AST: 71 — ABNORMAL HIGH
Albumin: 3.7
Alkaline Phosphatase: 80
BUN: 25 — ABNORMAL HIGH
CO2: 35 — ABNORMAL HIGH
Calcium: 10.1
Chloride: 97
Creatinine, Ser: 0.99
GFR calc Af Amer: >60
GFR calc non Af Amer: 54 — ABNORMAL LOW
Glucose, Bld: 94
Potassium: 4.8
Sodium: 140
Total Bilirubin: 0.7
Total Protein: 6.4

## 2011-07-27 LAB — PROTIME-INR
INR: 1.4
INR: 1.4
INR: 1.6 — ABNORMAL HIGH
INR: 1.6 — ABNORMAL HIGH
INR: 1.7 — ABNORMAL HIGH
INR: 1.7 — ABNORMAL HIGH
INR: 1.9 — ABNORMAL HIGH
INR: 1.9 — ABNORMAL HIGH
INR: 2.2 — ABNORMAL HIGH
INR: 2.3 — ABNORMAL HIGH
INR: 2.9 — ABNORMAL HIGH
Prothrombin Time: 17 — ABNORMAL HIGH
Prothrombin Time: 17.7 — ABNORMAL HIGH
Prothrombin Time: 19.1 — ABNORMAL HIGH
Prothrombin Time: 19.5 — ABNORMAL HIGH
Prothrombin Time: 20.8 — ABNORMAL HIGH
Prothrombin Time: 20.9 — ABNORMAL HIGH
Prothrombin Time: 22.5 — ABNORMAL HIGH
Prothrombin Time: 22.7 — ABNORMAL HIGH
Prothrombin Time: 25.1 — ABNORMAL HIGH
Prothrombin Time: 25.9 — ABNORMAL HIGH
Prothrombin Time: 31.1 — ABNORMAL HIGH

## 2011-07-27 LAB — POCT I-STAT, CHEM 8
BUN: 17
Calcium, Ion: 1.25
Chloride: 95 — ABNORMAL LOW
Creatinine, Ser: 0.8
Glucose, Bld: 139 — ABNORMAL HIGH
HCT: 39
Hemoglobin: 13.3
Potassium: 4.3
Sodium: 133 — ABNORMAL LOW
TCO2: 29

## 2011-07-27 LAB — LIPID PANEL
Cholesterol: 163
HDL: 76
LDL Cholesterol: 79
Total CHOL/HDL Ratio: 2.1
Triglycerides: 42
VLDL: 8

## 2011-07-27 LAB — HEPARIN LEVEL (UNFRACTIONATED)
Heparin Unfractionated: 0.23 — ABNORMAL LOW
Heparin Unfractionated: 0.37
Heparin Unfractionated: 0.4
Heparin Unfractionated: 0.57
Heparin Unfractionated: 0.57
Heparin Unfractionated: 0.62
Heparin Unfractionated: 0.64
Heparin Unfractionated: 0.68
Heparin Unfractionated: 0.78 — ABNORMAL HIGH

## 2011-07-27 LAB — POCT CARDIAC MARKERS
CKMB, poc: 2.8
Myoglobin, poc: 129
Operator id: 294521
Troponin i, poc: 0.18 — ABNORMAL HIGH

## 2011-07-27 LAB — CARDIAC PANEL(CRET KIN+CKTOT+MB+TROPI)
CK, MB: 5.4 — ABNORMAL HIGH
CK, MB: 6.7 — ABNORMAL HIGH
Relative Index: 4.7 — ABNORMAL HIGH
Relative Index: 5.3 — ABNORMAL HIGH
Total CK: 116
Total CK: 126
Troponin I: 0.66
Troponin I: 1.41

## 2011-07-27 LAB — LIPASE, BLOOD: Lipase: 16

## 2011-07-27 LAB — CK TOTAL AND CKMB (NOT AT ARMC)
CK, MB: 3.8
Relative Index: INVALID
Total CK: 99

## 2011-07-27 LAB — TSH: TSH: 2.429

## 2011-07-27 LAB — B-NATRIURETIC PEPTIDE (CONVERTED LAB): Pro B Natriuretic peptide (BNP): 390 — ABNORMAL HIGH

## 2011-07-27 LAB — TROPONIN I: Troponin I: 0.3 — ABNORMAL HIGH

## 2011-07-27 LAB — APTT: aPTT: 35

## 2011-07-27 LAB — MAGNESIUM: Magnesium: 1.8

## 2011-08-01 LAB — DIFFERENTIAL
Basophils Absolute: 0
Basophils Relative: 1
Eosinophils Absolute: 0.2
Eosinophils Relative: 5
Lymphocytes Relative: 17
Lymphs Abs: 0.9
Monocytes Absolute: 0.8
Monocytes Relative: 16 — ABNORMAL HIGH
Neutro Abs: 3.1
Neutrophils Relative %: 61

## 2011-08-01 LAB — CBC
HCT: 28.5 — ABNORMAL LOW
HCT: 29.4 — ABNORMAL LOW
Hemoglobin: 9.7 — ABNORMAL LOW
Hemoglobin: 9.9 — ABNORMAL LOW
MCHC: 33.6
MCHC: 34.2
MCV: 89.2
MCV: 89.8
Platelets: 348
Platelets: 392
RBC: 3.2 — ABNORMAL LOW
RBC: 3.27 — ABNORMAL LOW
RDW: 14.4
RDW: 14.6
WBC: 5
WBC: 6

## 2011-08-01 LAB — PROTIME-INR
INR: 1.9 — ABNORMAL HIGH
INR: 2 — ABNORMAL HIGH
INR: 2 — ABNORMAL HIGH
INR: 2.2 — ABNORMAL HIGH
INR: 2.3 — ABNORMAL HIGH
INR: 2.5 — ABNORMAL HIGH
INR: 2.6 — ABNORMAL HIGH
INR: 2.7 — ABNORMAL HIGH
Prothrombin Time: 22.5 — ABNORMAL HIGH
Prothrombin Time: 23.7 — ABNORMAL HIGH
Prothrombin Time: 24.3 — ABNORMAL HIGH
Prothrombin Time: 25.9 — ABNORMAL HIGH
Prothrombin Time: 26.8 — ABNORMAL HIGH
Prothrombin Time: 28.6 — ABNORMAL HIGH
Prothrombin Time: 29.4 — ABNORMAL HIGH
Prothrombin Time: 31 — ABNORMAL HIGH

## 2011-08-01 LAB — COMPREHENSIVE METABOLIC PANEL
ALT: 9
AST: 23
Albumin: 2.7 — ABNORMAL LOW
Alkaline Phosphatase: 88
BUN: 10
CO2: 31
Calcium: 8.9
Chloride: 94 — ABNORMAL LOW
Creatinine, Ser: 0.66
GFR calc Af Amer: >60
GFR calc non Af Amer: >60
Glucose, Bld: 94
Potassium: 3.6
Sodium: 136
Total Bilirubin: 1
Total Protein: 5.1 — ABNORMAL LOW

## 2011-08-01 LAB — TSH: TSH: 3.915

## 2011-08-01 LAB — GLUCOSE, CAPILLARY: Glucose-Capillary: 150 — ABNORMAL HIGH

## 2011-08-03 LAB — B-NATRIURETIC PEPTIDE (CONVERTED LAB): Pro B Natriuretic peptide (BNP): 113 — ABNORMAL HIGH

## 2011-08-03 LAB — URINE CULTURE: Colony Count: >=100000

## 2011-08-03 LAB — POCT CARDIAC MARKERS
CKMB, poc: 1.9
Myoglobin, poc: 99.1
Troponin i, poc: <0.05

## 2011-08-03 LAB — CBC
HCT: 26.5 — ABNORMAL LOW
HCT: 31.3 — ABNORMAL LOW
Hemoglobin: 10.4 — ABNORMAL LOW
Hemoglobin: 8.9 — ABNORMAL LOW
MCHC: 33.2
MCHC: 33.6
MCV: 88.9
MCV: 90.1
Platelets: 286
Platelets: 286
RBC: 2.98 — ABNORMAL LOW
RBC: 3.48 — ABNORMAL LOW
RDW: 14.5
RDW: 14.5
WBC: 5.3
WBC: 5.5

## 2011-08-03 LAB — COMPREHENSIVE METABOLIC PANEL
ALT: 12
AST: 24
Albumin: 2.8 — ABNORMAL LOW
Alkaline Phosphatase: 65
BUN: 7
CO2: 27
Calcium: 9
Chloride: 102
Creatinine, Ser: 0.62
GFR calc Af Amer: >60
GFR calc non Af Amer: >60
Glucose, Bld: 120 — ABNORMAL HIGH
Potassium: 3.8
Sodium: 138
Total Bilirubin: 1.4 — ABNORMAL HIGH
Total Protein: 5.9 — ABNORMAL LOW

## 2011-08-03 LAB — DIFFERENTIAL
Basophils Absolute: 0
Basophils Relative: 0
Eosinophils Absolute: 0.4
Eosinophils Relative: 8 — ABNORMAL HIGH
Lymphocytes Relative: 14
Lymphs Abs: 0.7
Monocytes Absolute: 0.7
Monocytes Relative: 13 — ABNORMAL HIGH
Neutro Abs: 3.4
Neutrophils Relative %: 64

## 2011-08-03 LAB — URINALYSIS, ROUTINE W REFLEX MICROSCOPIC
Bilirubin Urine: NEGATIVE
Glucose, UA: NEGATIVE
Hgb urine dipstick: NEGATIVE
Ketones, ur: NEGATIVE
Nitrite: NEGATIVE
Protein, ur: NEGATIVE
Specific Gravity, Urine: 1.008
Urobilinogen, UA: 0.2
pH: 7.5

## 2011-08-03 LAB — URINALYSIS, MICROSCOPIC ONLY
Bilirubin Urine: NEGATIVE
Glucose, UA: NEGATIVE
Hgb urine dipstick: NEGATIVE
Ketones, ur: NEGATIVE
Nitrite: NEGATIVE
Protein, ur: NEGATIVE
Specific Gravity, Urine: 1.008
Urobilinogen, UA: 0.2
pH: 8.5 — ABNORMAL HIGH

## 2011-08-03 LAB — POCT I-STAT, CHEM 8
BUN: 8
Calcium, Ion: 1.23
Chloride: 103
Creatinine, Ser: 0.8
Glucose, Bld: 117 — ABNORMAL HIGH
HCT: 31 — ABNORMAL LOW
Hemoglobin: 10.5 — ABNORMAL LOW
Potassium: 3.7
Sodium: 138
TCO2: 30

## 2011-08-03 LAB — PROTIME-INR
INR: 2.4 — ABNORMAL HIGH
INR: 2.8 — ABNORMAL HIGH
Prothrombin Time: 27.8 — ABNORMAL HIGH
Prothrombin Time: 31.4 — ABNORMAL HIGH

## 2011-08-03 LAB — APTT: aPTT: 48 — ABNORMAL HIGH

## 2011-08-10 LAB — BASIC METABOLIC PANEL
BUN: 3 — ABNORMAL LOW
BUN: 4 — ABNORMAL LOW
BUN: 7
CO2: 34 — ABNORMAL HIGH
CO2: 34 — ABNORMAL HIGH
CO2: 35 — ABNORMAL HIGH
Calcium: 8.5
Calcium: 8.7
Calcium: 8.8
Chloride: 95 — ABNORMAL LOW
Chloride: 96
Chloride: 99
Creatinine, Ser: 0.56
Creatinine, Ser: 0.6
Creatinine, Ser: 0.68
GFR calc Af Amer: >60
GFR calc Af Amer: >60
GFR calc Af Amer: >60
GFR calc non Af Amer: >60
GFR calc non Af Amer: >60
GFR calc non Af Amer: >60
Glucose, Bld: 118 — ABNORMAL HIGH
Glucose, Bld: 123 — ABNORMAL HIGH
Glucose, Bld: 95
Potassium: 2.9 — ABNORMAL LOW
Potassium: 3.2 — ABNORMAL LOW
Potassium: 3.6
Sodium: 134 — ABNORMAL LOW
Sodium: 137
Sodium: 139

## 2011-08-10 LAB — PROTIME-INR
INR: 1.1
INR: 1.1
INR: 1.6 — ABNORMAL HIGH
INR: 1.8 — ABNORMAL HIGH
Prothrombin Time: 14.3
Prothrombin Time: 14.8
Prothrombin Time: 19.8 — ABNORMAL HIGH
Prothrombin Time: 21.6 — ABNORMAL HIGH

## 2011-08-10 LAB — CBC
HCT: 25.1 — ABNORMAL LOW
HCT: 26.7 — ABNORMAL LOW
HCT: 29.1 — ABNORMAL LOW
HCT: 43.9
Hemoglobin: 14.5
Hemoglobin: 8.5 — ABNORMAL LOW
Hemoglobin: 9.1 — ABNORMAL LOW
Hemoglobin: 9.8 — ABNORMAL LOW
MCHC: 32.9
MCHC: 33.6
MCHC: 33.9
MCHC: 34
MCV: 87.8
MCV: 88.1
MCV: 88.8
MCV: 89.4
Platelets: 183
Platelets: 197
Platelets: 206
Platelets: 313
RBC: 2.85 — ABNORMAL LOW
RBC: 3.04 — ABNORMAL LOW
RBC: 3.28 — ABNORMAL LOW
RBC: 4.92
RDW: 13.8
RDW: 14
RDW: 14.2 — ABNORMAL HIGH
RDW: 14.2 — ABNORMAL HIGH
WBC: 5.4
WBC: 5.9
WBC: 6.2
WBC: 6.2

## 2011-08-10 LAB — COMPREHENSIVE METABOLIC PANEL
ALT: 16
AST: 34
Albumin: 4
Alkaline Phosphatase: 65
BUN: 13
CO2: 31
Calcium: 10.3
Chloride: 97
Creatinine, Ser: 0.69
GFR calc Af Amer: >60
GFR calc non Af Amer: >60
Glucose, Bld: 105 — ABNORMAL HIGH
Potassium: 4
Sodium: 139
Total Bilirubin: 0.6
Total Protein: 6.7

## 2011-11-02 DIAGNOSIS — I4891 Unspecified atrial fibrillation: Secondary | ICD-10-CM | POA: Diagnosis not present

## 2011-11-02 DIAGNOSIS — Z7901 Long term (current) use of anticoagulants: Secondary | ICD-10-CM | POA: Diagnosis not present

## 2011-11-23 DIAGNOSIS — H353290 Exudative age-related macular degeneration, unspecified eye, stage unspecified: Secondary | ICD-10-CM | POA: Diagnosis not present

## 2011-11-23 DIAGNOSIS — H35059 Retinal neovascularization, unspecified, unspecified eye: Secondary | ICD-10-CM | POA: Diagnosis not present

## 2011-11-23 DIAGNOSIS — H43819 Vitreous degeneration, unspecified eye: Secondary | ICD-10-CM | POA: Diagnosis not present

## 2011-11-23 DIAGNOSIS — H353190 Nonexudative age-related macular degeneration, unspecified eye, stage unspecified: Secondary | ICD-10-CM | POA: Diagnosis not present

## 2011-11-30 DIAGNOSIS — I4891 Unspecified atrial fibrillation: Secondary | ICD-10-CM | POA: Diagnosis not present

## 2011-11-30 DIAGNOSIS — Z7901 Long term (current) use of anticoagulants: Secondary | ICD-10-CM | POA: Diagnosis not present

## 2011-11-30 DIAGNOSIS — Z950 Presence of cardiac pacemaker: Secondary | ICD-10-CM | POA: Diagnosis not present

## 2011-11-30 DIAGNOSIS — E782 Mixed hyperlipidemia: Secondary | ICD-10-CM | POA: Diagnosis not present

## 2011-11-30 DIAGNOSIS — I495 Sick sinus syndrome: Secondary | ICD-10-CM | POA: Diagnosis not present

## 2011-11-30 DIAGNOSIS — I359 Nonrheumatic aortic valve disorder, unspecified: Secondary | ICD-10-CM | POA: Diagnosis not present

## 2011-12-21 DIAGNOSIS — H43819 Vitreous degeneration, unspecified eye: Secondary | ICD-10-CM | POA: Diagnosis not present

## 2011-12-21 DIAGNOSIS — H353190 Nonexudative age-related macular degeneration, unspecified eye, stage unspecified: Secondary | ICD-10-CM | POA: Diagnosis not present

## 2011-12-21 DIAGNOSIS — H353290 Exudative age-related macular degeneration, unspecified eye, stage unspecified: Secondary | ICD-10-CM | POA: Diagnosis not present

## 2011-12-21 DIAGNOSIS — H35059 Retinal neovascularization, unspecified, unspecified eye: Secondary | ICD-10-CM | POA: Diagnosis not present

## 2011-12-26 DIAGNOSIS — I495 Sick sinus syndrome: Secondary | ICD-10-CM | POA: Diagnosis not present

## 2011-12-28 DIAGNOSIS — Z7901 Long term (current) use of anticoagulants: Secondary | ICD-10-CM | POA: Diagnosis not present

## 2011-12-28 DIAGNOSIS — I4891 Unspecified atrial fibrillation: Secondary | ICD-10-CM | POA: Diagnosis not present

## 2012-01-03 DIAGNOSIS — Z950 Presence of cardiac pacemaker: Secondary | ICD-10-CM | POA: Diagnosis not present

## 2012-01-11 DIAGNOSIS — L570 Actinic keratosis: Secondary | ICD-10-CM | POA: Diagnosis not present

## 2012-01-11 DIAGNOSIS — D239 Other benign neoplasm of skin, unspecified: Secondary | ICD-10-CM | POA: Diagnosis not present

## 2012-01-11 DIAGNOSIS — L819 Disorder of pigmentation, unspecified: Secondary | ICD-10-CM | POA: Diagnosis not present

## 2012-01-11 DIAGNOSIS — Z85828 Personal history of other malignant neoplasm of skin: Secondary | ICD-10-CM | POA: Diagnosis not present

## 2012-01-11 DIAGNOSIS — L821 Other seborrheic keratosis: Secondary | ICD-10-CM | POA: Diagnosis not present

## 2012-01-18 DIAGNOSIS — H353290 Exudative age-related macular degeneration, unspecified eye, stage unspecified: Secondary | ICD-10-CM | POA: Diagnosis not present

## 2012-01-18 DIAGNOSIS — H43819 Vitreous degeneration, unspecified eye: Secondary | ICD-10-CM | POA: Diagnosis not present

## 2012-01-18 DIAGNOSIS — H35059 Retinal neovascularization, unspecified, unspecified eye: Secondary | ICD-10-CM | POA: Diagnosis not present

## 2012-01-18 DIAGNOSIS — H353190 Nonexudative age-related macular degeneration, unspecified eye, stage unspecified: Secondary | ICD-10-CM | POA: Diagnosis not present

## 2012-01-25 DIAGNOSIS — I495 Sick sinus syndrome: Secondary | ICD-10-CM | POA: Diagnosis not present

## 2012-01-25 DIAGNOSIS — I359 Nonrheumatic aortic valve disorder, unspecified: Secondary | ICD-10-CM | POA: Diagnosis not present

## 2012-01-25 DIAGNOSIS — I10 Essential (primary) hypertension: Secondary | ICD-10-CM | POA: Diagnosis not present

## 2012-01-25 DIAGNOSIS — Z7901 Long term (current) use of anticoagulants: Secondary | ICD-10-CM | POA: Diagnosis not present

## 2012-01-25 DIAGNOSIS — I4891 Unspecified atrial fibrillation: Secondary | ICD-10-CM | POA: Diagnosis not present

## 2012-01-25 DIAGNOSIS — Z950 Presence of cardiac pacemaker: Secondary | ICD-10-CM | POA: Diagnosis not present

## 2012-02-22 DIAGNOSIS — I4891 Unspecified atrial fibrillation: Secondary | ICD-10-CM | POA: Diagnosis not present

## 2012-02-22 DIAGNOSIS — Z7901 Long term (current) use of anticoagulants: Secondary | ICD-10-CM | POA: Diagnosis not present

## 2012-02-29 DIAGNOSIS — H353190 Nonexudative age-related macular degeneration, unspecified eye, stage unspecified: Secondary | ICD-10-CM | POA: Diagnosis not present

## 2012-02-29 DIAGNOSIS — H353290 Exudative age-related macular degeneration, unspecified eye, stage unspecified: Secondary | ICD-10-CM | POA: Diagnosis not present

## 2012-02-29 DIAGNOSIS — H35059 Retinal neovascularization, unspecified, unspecified eye: Secondary | ICD-10-CM | POA: Diagnosis not present

## 2012-02-29 DIAGNOSIS — H43819 Vitreous degeneration, unspecified eye: Secondary | ICD-10-CM | POA: Diagnosis not present

## 2012-03-14 DIAGNOSIS — Z1331 Encounter for screening for depression: Secondary | ICD-10-CM | POA: Diagnosis not present

## 2012-03-14 DIAGNOSIS — E039 Hypothyroidism, unspecified: Secondary | ICD-10-CM | POA: Diagnosis not present

## 2012-03-14 DIAGNOSIS — I10 Essential (primary) hypertension: Secondary | ICD-10-CM | POA: Diagnosis not present

## 2012-04-11 DIAGNOSIS — Z7901 Long term (current) use of anticoagulants: Secondary | ICD-10-CM | POA: Diagnosis not present

## 2012-04-11 DIAGNOSIS — I4891 Unspecified atrial fibrillation: Secondary | ICD-10-CM | POA: Diagnosis not present

## 2012-05-01 DIAGNOSIS — H353290 Exudative age-related macular degeneration, unspecified eye, stage unspecified: Secondary | ICD-10-CM | POA: Diagnosis not present

## 2012-05-01 DIAGNOSIS — H43819 Vitreous degeneration, unspecified eye: Secondary | ICD-10-CM | POA: Diagnosis not present

## 2012-05-01 DIAGNOSIS — H35059 Retinal neovascularization, unspecified, unspecified eye: Secondary | ICD-10-CM | POA: Diagnosis not present

## 2012-05-01 DIAGNOSIS — H353190 Nonexudative age-related macular degeneration, unspecified eye, stage unspecified: Secondary | ICD-10-CM | POA: Diagnosis not present

## 2012-05-15 DIAGNOSIS — I4891 Unspecified atrial fibrillation: Secondary | ICD-10-CM | POA: Diagnosis not present

## 2012-05-15 DIAGNOSIS — Z7901 Long term (current) use of anticoagulants: Secondary | ICD-10-CM | POA: Diagnosis not present

## 2012-06-27 DIAGNOSIS — Z7901 Long term (current) use of anticoagulants: Secondary | ICD-10-CM | POA: Diagnosis not present

## 2012-06-27 DIAGNOSIS — I4891 Unspecified atrial fibrillation: Secondary | ICD-10-CM | POA: Diagnosis not present

## 2012-07-04 DIAGNOSIS — H353190 Nonexudative age-related macular degeneration, unspecified eye, stage unspecified: Secondary | ICD-10-CM | POA: Diagnosis not present

## 2012-07-04 DIAGNOSIS — H35059 Retinal neovascularization, unspecified, unspecified eye: Secondary | ICD-10-CM | POA: Diagnosis not present

## 2012-07-04 DIAGNOSIS — H43819 Vitreous degeneration, unspecified eye: Secondary | ICD-10-CM | POA: Diagnosis not present

## 2012-07-04 DIAGNOSIS — H353290 Exudative age-related macular degeneration, unspecified eye, stage unspecified: Secondary | ICD-10-CM | POA: Diagnosis not present

## 2012-07-12 DIAGNOSIS — Z23 Encounter for immunization: Secondary | ICD-10-CM | POA: Diagnosis not present

## 2012-08-01 DIAGNOSIS — I495 Sick sinus syndrome: Secondary | ICD-10-CM | POA: Diagnosis not present

## 2012-08-01 DIAGNOSIS — Z7901 Long term (current) use of anticoagulants: Secondary | ICD-10-CM | POA: Diagnosis not present

## 2012-08-01 DIAGNOSIS — I359 Nonrheumatic aortic valve disorder, unspecified: Secondary | ICD-10-CM | POA: Diagnosis not present

## 2012-08-01 DIAGNOSIS — Z950 Presence of cardiac pacemaker: Secondary | ICD-10-CM | POA: Diagnosis not present

## 2012-08-01 DIAGNOSIS — I4891 Unspecified atrial fibrillation: Secondary | ICD-10-CM | POA: Diagnosis not present

## 2012-09-12 DIAGNOSIS — I4891 Unspecified atrial fibrillation: Secondary | ICD-10-CM | POA: Diagnosis not present

## 2012-09-12 DIAGNOSIS — Z7901 Long term (current) use of anticoagulants: Secondary | ICD-10-CM | POA: Diagnosis not present

## 2012-09-19 DIAGNOSIS — Z0000 Encounter for general adult medical examination without abnormal findings: Secondary | ICD-10-CM | POA: Diagnosis not present

## 2012-09-19 DIAGNOSIS — E039 Hypothyroidism, unspecified: Secondary | ICD-10-CM | POA: Diagnosis not present

## 2012-09-19 DIAGNOSIS — I10 Essential (primary) hypertension: Secondary | ICD-10-CM | POA: Diagnosis not present

## 2012-09-19 DIAGNOSIS — Z1331 Encounter for screening for depression: Secondary | ICD-10-CM | POA: Diagnosis not present

## 2012-10-03 DIAGNOSIS — H35059 Retinal neovascularization, unspecified, unspecified eye: Secondary | ICD-10-CM | POA: Diagnosis not present

## 2012-10-03 DIAGNOSIS — H353190 Nonexudative age-related macular degeneration, unspecified eye, stage unspecified: Secondary | ICD-10-CM | POA: Diagnosis not present

## 2012-10-03 DIAGNOSIS — H353290 Exudative age-related macular degeneration, unspecified eye, stage unspecified: Secondary | ICD-10-CM | POA: Diagnosis not present

## 2012-10-17 DIAGNOSIS — Z7901 Long term (current) use of anticoagulants: Secondary | ICD-10-CM | POA: Diagnosis not present

## 2012-10-17 DIAGNOSIS — I4891 Unspecified atrial fibrillation: Secondary | ICD-10-CM | POA: Diagnosis not present

## 2012-11-26 DIAGNOSIS — H353190 Nonexudative age-related macular degeneration, unspecified eye, stage unspecified: Secondary | ICD-10-CM | POA: Diagnosis not present

## 2012-11-26 DIAGNOSIS — Z961 Presence of intraocular lens: Secondary | ICD-10-CM | POA: Diagnosis not present

## 2012-12-05 DIAGNOSIS — Z7901 Long term (current) use of anticoagulants: Secondary | ICD-10-CM | POA: Diagnosis not present

## 2012-12-05 DIAGNOSIS — I4891 Unspecified atrial fibrillation: Secondary | ICD-10-CM | POA: Diagnosis not present

## 2012-12-26 DIAGNOSIS — I4891 Unspecified atrial fibrillation: Secondary | ICD-10-CM | POA: Diagnosis not present

## 2012-12-26 DIAGNOSIS — Z7901 Long term (current) use of anticoagulants: Secondary | ICD-10-CM | POA: Diagnosis not present

## 2013-01-29 DIAGNOSIS — I495 Sick sinus syndrome: Secondary | ICD-10-CM | POA: Diagnosis not present

## 2013-01-29 DIAGNOSIS — Z7901 Long term (current) use of anticoagulants: Secondary | ICD-10-CM | POA: Diagnosis not present

## 2013-01-29 DIAGNOSIS — I4891 Unspecified atrial fibrillation: Secondary | ICD-10-CM | POA: Diagnosis not present

## 2013-01-29 DIAGNOSIS — Z950 Presence of cardiac pacemaker: Secondary | ICD-10-CM | POA: Diagnosis not present

## 2013-01-30 DIAGNOSIS — H35429 Microcystoid degeneration of retina, unspecified eye: Secondary | ICD-10-CM | POA: Diagnosis not present

## 2013-01-30 DIAGNOSIS — H35059 Retinal neovascularization, unspecified, unspecified eye: Secondary | ICD-10-CM | POA: Diagnosis not present

## 2013-01-30 DIAGNOSIS — H353190 Nonexudative age-related macular degeneration, unspecified eye, stage unspecified: Secondary | ICD-10-CM | POA: Diagnosis not present

## 2013-01-30 DIAGNOSIS — H353290 Exudative age-related macular degeneration, unspecified eye, stage unspecified: Secondary | ICD-10-CM | POA: Diagnosis not present

## 2013-02-06 DIAGNOSIS — Z7901 Long term (current) use of anticoagulants: Secondary | ICD-10-CM | POA: Diagnosis not present

## 2013-02-06 DIAGNOSIS — I4891 Unspecified atrial fibrillation: Secondary | ICD-10-CM | POA: Diagnosis not present

## 2013-02-06 DIAGNOSIS — I10 Essential (primary) hypertension: Secondary | ICD-10-CM | POA: Diagnosis not present

## 2013-02-06 DIAGNOSIS — I359 Nonrheumatic aortic valve disorder, unspecified: Secondary | ICD-10-CM | POA: Diagnosis not present

## 2013-02-06 DIAGNOSIS — Z950 Presence of cardiac pacemaker: Secondary | ICD-10-CM | POA: Diagnosis not present

## 2013-03-06 DIAGNOSIS — Z7901 Long term (current) use of anticoagulants: Secondary | ICD-10-CM | POA: Diagnosis not present

## 2013-03-06 DIAGNOSIS — I4891 Unspecified atrial fibrillation: Secondary | ICD-10-CM | POA: Diagnosis not present

## 2013-03-20 DIAGNOSIS — IMO0002 Reserved for concepts with insufficient information to code with codable children: Secondary | ICD-10-CM | POA: Diagnosis not present

## 2013-03-20 DIAGNOSIS — J309 Allergic rhinitis, unspecified: Secondary | ICD-10-CM | POA: Diagnosis not present

## 2013-03-20 DIAGNOSIS — I10 Essential (primary) hypertension: Secondary | ICD-10-CM | POA: Diagnosis not present

## 2013-03-20 DIAGNOSIS — E039 Hypothyroidism, unspecified: Secondary | ICD-10-CM | POA: Diagnosis not present

## 2013-03-20 DIAGNOSIS — M1710 Unilateral primary osteoarthritis, unspecified knee: Secondary | ICD-10-CM | POA: Diagnosis not present

## 2013-04-17 DIAGNOSIS — I4891 Unspecified atrial fibrillation: Secondary | ICD-10-CM | POA: Diagnosis not present

## 2013-04-17 DIAGNOSIS — M25559 Pain in unspecified hip: Secondary | ICD-10-CM | POA: Diagnosis not present

## 2013-04-17 DIAGNOSIS — M25569 Pain in unspecified knee: Secondary | ICD-10-CM | POA: Diagnosis not present

## 2013-04-17 DIAGNOSIS — Z7901 Long term (current) use of anticoagulants: Secondary | ICD-10-CM | POA: Diagnosis not present

## 2013-05-29 DIAGNOSIS — Z7901 Long term (current) use of anticoagulants: Secondary | ICD-10-CM | POA: Diagnosis not present

## 2013-05-29 DIAGNOSIS — I4891 Unspecified atrial fibrillation: Secondary | ICD-10-CM | POA: Diagnosis not present

## 2013-07-10 DIAGNOSIS — Z7901 Long term (current) use of anticoagulants: Secondary | ICD-10-CM | POA: Diagnosis not present

## 2013-07-10 DIAGNOSIS — I4891 Unspecified atrial fibrillation: Secondary | ICD-10-CM | POA: Diagnosis not present

## 2013-07-30 DIAGNOSIS — Z23 Encounter for immunization: Secondary | ICD-10-CM | POA: Diagnosis not present

## 2013-08-07 DIAGNOSIS — H353290 Exudative age-related macular degeneration, unspecified eye, stage unspecified: Secondary | ICD-10-CM | POA: Diagnosis not present

## 2013-08-07 DIAGNOSIS — H353190 Nonexudative age-related macular degeneration, unspecified eye, stage unspecified: Secondary | ICD-10-CM | POA: Diagnosis not present

## 2013-08-07 DIAGNOSIS — H43819 Vitreous degeneration, unspecified eye: Secondary | ICD-10-CM | POA: Diagnosis not present

## 2013-08-07 DIAGNOSIS — H35059 Retinal neovascularization, unspecified, unspecified eye: Secondary | ICD-10-CM | POA: Diagnosis not present

## 2013-08-11 ENCOUNTER — Encounter: Payer: Self-pay | Admitting: Cardiology

## 2013-08-11 ENCOUNTER — Encounter: Payer: Self-pay | Admitting: *Deleted

## 2013-08-14 ENCOUNTER — Encounter: Payer: Self-pay | Admitting: Cardiology

## 2013-08-14 ENCOUNTER — Ambulatory Visit (INDEPENDENT_AMBULATORY_CARE_PROVIDER_SITE_OTHER): Payer: Medicare Other | Admitting: Cardiology

## 2013-08-14 VITALS — BP 136/60 | HR 88 | Ht 67.0 in | Wt 151.0 lb

## 2013-08-14 DIAGNOSIS — Z7901 Long term (current) use of anticoagulants: Secondary | ICD-10-CM | POA: Insufficient documentation

## 2013-08-14 DIAGNOSIS — I10 Essential (primary) hypertension: Secondary | ICD-10-CM | POA: Diagnosis not present

## 2013-08-14 DIAGNOSIS — I4891 Unspecified atrial fibrillation: Secondary | ICD-10-CM | POA: Insufficient documentation

## 2013-08-14 DIAGNOSIS — I359 Nonrheumatic aortic valve disorder, unspecified: Secondary | ICD-10-CM | POA: Diagnosis not present

## 2013-08-14 NOTE — Progress Notes (Signed)
[address] [place], [place]  [address] Phone: [phone] Fax:  [phone]  Date:  08/14/2013   ID:  Patricia Holt, Patricia Holt 01-Nov-1923, MRN [id number]  PCP:  Lillia Mountain, MD   History of Present Illness: Patricia Holt is a 79 y.o. female persistent atrial fibrillation, pacemaker dual chamber because of prior tachycardia bradycardia syndrome in April of 2010, chronic anticoagulation with recent bout of cardiomyopathy following cardioversion that spontaneously resolved here for followup.  On 11/30/11-her lisinopril was stopped because of chronic cough. Losartan was started. Occasional cough still persisted. Aortic valve showed moderate aortic insufficiency. 8/11 echocardiogram. Furosemide 40 mg was started at last clinic visit to be taken on as-needed basis if weight increased.  Loosing balance so stopped golf. Walking daily. No changes with breathing.    Wt Readings from Last 3 Encounters:  08/14/13 151 lb (68.493 kg)     Past Medical History  Diagnosis Date  . HTN (hypertension)   . Hyperlipidemia   . Hypothyroidism   . Sinoatrial node dysfunction   . Aortic valve disorder   . Osteoporosis     , severe, hypercalciuria, Boniva since 2005, T10 compression fracture 2009  . Goiter   . Polymyalgia rheumatica   . Carpal tunnel syndrome   . Other disorder of calcium metabolism   . Macular degeneration   . DJD (degenerative joint disease)   . Atrial fibrillation     paroxysmal, status post successful vessel cardioversion 5/09, skains,transient left ventricular systolic dysfunction, likely secondary to cardioversion/stunning of the myocardium, no evidence significant CAD, echo 7/09 shows normal ejection fraction  . Mitral regurgitation     Past Surgical History  Procedure Laterality Date  . Pacemaker insertion      Dual chamber permanent pacer insertion for tachy brady syndrome, 01/2009  . Femur fracture surgery    . Dilation and curettage of  uterus    . Tonsillectomy and adenoidectomy    . Carpal tunnel release    . Total knee arthroplasty      Current Outpatient Prescriptions  Medication Sig Dispense Refill  . acetaminophen (TYLENOL) 100 MG/ML solution Take 10 mg/kg by mouth every 4 (four) hours as needed for fever.      . Chlorphen-Pseudoephed-APAP (CORICIDIN D PO) Take by mouth.      . docusate sodium (COLACE) 50 MG capsule Take by mouth 2 (two) times daily.      . fluticasone (FLONASE) 50 MCG/ACT nasal spray Place 2 sprays into the nose daily.      . furosemide (LASIX) 40 MG tablet Take 40 mg by mouth.      . levothyroxine (SYNTHROID, LEVOTHROID) 25 MCG tablet Take 25 mcg by mouth daily before breakfast.      . losartan (COZAAR) 25 MG tablet Take 25 mg by mouth daily.      . metoprolol (LOPRESSOR) 100 MG tablet Take 100 mg by mouth daily.      . Multiple Vitamins-Minerals (ICAPS PO) Take 1,000 mg by mouth.      . risedronate (ACTONEL) 150 MG tablet Take 150 mg by mouth every 30 (thirty) days. with water on empty stomach, nothing by mouth or lie down for next 30 minutes.      . warfarin (COUMADIN) 5 MG tablet Take 5 mg by mouth daily. 7 mg on monday       No current facility-administered medications for this visit.    Allergies:   No Known Allergies  Social  History:  The patient  reports that she has never smoked. She does not have any smokeless tobacco history on file.   ROS:  Please see the history of present illness.   Knee pain when walking. Using cane. Has some balance issues so she does not play golf. No chest pain, no shortness of breath. No bleeding with Coumadin. No strokelike symptoms.   All other systems reviewed and negative.   PHYSICAL EXAM: VS:  BP 136/60  Pulse 88  Ht 5\' 7"  (1.702 m)  Wt 151 lb (68.493 kg)  BMI 23.64 kg/m2 Well nourished, well developed, in no acute distress HEENT: normal Neck: no JVD Cardiac:  Irregularly irregular, normal rate; 1/6 diastolic murmur right upper sternal  border Lungs:  clear to auscultation bilaterally, no wheezing, rhonchi or rales Abd: soft, nontender, no hepatomegaly Ext: no edema Skin: warm and dry Neuro: no focal abnormalities noted  EKG:  Atrial fibrillation 89, no ST segment changes  ASSESSMENT AND PLAN:  1. Atrial fibrillation-persistent, doing well. Well rate controlled on current medications. No changes made. 2. Pacemaker-functioning well. 3. Chronic anticoagulation-no bleeding. Continue to monitor closely. 4. Hypertension-currently on losartan. She's not had any significant change with her dry cough. She thinks that it is mucus she has increased mucous production in the morning. This is likely. 5. We will see her back in 6 months for followup. 6. Aortic regurgitation-mild to moderate. No change clinically. No need for repeat echocardiogram at this time.  Signed, Donato Schultz, MD [facility]  08/14/2013 12:19 PM

## 2013-08-14 NOTE — Patient Instructions (Signed)
Your physician recommends that you continue on your current medications as directed. Please refer to the Current Medication list given to you today.  Your physician wants you to follow-up in: 6 months with Dr. Skains. You will receive a reminder letter in the mail two months in advance. If you don't receive a letter, please call our office to schedule the follow-up appointment.  

## 2013-08-21 ENCOUNTER — Ambulatory Visit (INDEPENDENT_AMBULATORY_CARE_PROVIDER_SITE_OTHER): Payer: Medicare Other | Admitting: Pharmacist

## 2013-08-21 DIAGNOSIS — I4891 Unspecified atrial fibrillation: Secondary | ICD-10-CM

## 2013-08-21 DIAGNOSIS — Z7901 Long term (current) use of anticoagulants: Secondary | ICD-10-CM | POA: Insufficient documentation

## 2013-08-21 LAB — POCT INR: INR: 2

## 2013-09-18 ENCOUNTER — Ambulatory Visit (INDEPENDENT_AMBULATORY_CARE_PROVIDER_SITE_OTHER): Payer: Medicare Other | Admitting: *Deleted

## 2013-09-18 ENCOUNTER — Encounter: Payer: Self-pay | Admitting: Internal Medicine

## 2013-09-18 DIAGNOSIS — I4891 Unspecified atrial fibrillation: Secondary | ICD-10-CM | POA: Diagnosis not present

## 2013-09-18 DIAGNOSIS — I10 Essential (primary) hypertension: Secondary | ICD-10-CM | POA: Diagnosis not present

## 2013-09-18 DIAGNOSIS — M810 Age-related osteoporosis without current pathological fracture: Secondary | ICD-10-CM | POA: Diagnosis not present

## 2013-09-18 LAB — MDC_IDC_ENUM_SESS_TYPE_INCLINIC
Battery Impedance: 723 Ohm
Battery Remaining Longevity: 76 mo
Battery Voltage: 2.77 V
Brady Statistic RV Percent Paced: 29 %
Date Time Interrogation Session: 20141119161122
Lead Channel Impedance Value: 617 Ohm
Lead Channel Impedance Value: 67 Ohm
Lead Channel Pacing Threshold Amplitude: 0.5 V
Lead Channel Pacing Threshold Pulse Width: 0.4 ms
Lead Channel Sensing Intrinsic Amplitude: 15.67 mV
Lead Channel Setting Pacing Amplitude: 2.5 V
Lead Channel Setting Pacing Pulse Width: 0.4 ms
Lead Channel Setting Sensing Sensitivity: 5.6 mV

## 2013-09-18 NOTE — Progress Notes (Signed)
Pacemaker check in clinic. Normal device function. Threshold, sensing, impedance consistent with previous measurements. Device programmed to maximize longevity. Permanent AF + Warfarin. 2,144 high ventricular rates noted---max dur. 5 mins, Max V 226---irreg V-V ?AF with RVR. RV output was increased from 2V to 2.5V for increased safety margin. Histogram distribution appropriate for patient activity level. Device programmed to optimize intrinsic conduction. Estimated longevity 6 years. Patient will follow up with GT on 12-04-2013.

## 2013-10-02 ENCOUNTER — Ambulatory Visit (INDEPENDENT_AMBULATORY_CARE_PROVIDER_SITE_OTHER): Payer: Medicare Other | Admitting: Pharmacist

## 2013-10-02 DIAGNOSIS — Z5181 Encounter for therapeutic drug level monitoring: Secondary | ICD-10-CM

## 2013-10-02 DIAGNOSIS — I4891 Unspecified atrial fibrillation: Secondary | ICD-10-CM

## 2013-10-02 DIAGNOSIS — Z7901 Long term (current) use of anticoagulants: Secondary | ICD-10-CM | POA: Diagnosis not present

## 2013-10-02 LAB — POCT INR: INR: 3.1

## 2013-10-11 ENCOUNTER — Telehealth: Payer: Self-pay | Admitting: *Deleted

## 2013-10-11 ENCOUNTER — Other Ambulatory Visit: Payer: Self-pay

## 2013-10-11 ENCOUNTER — Other Ambulatory Visit: Payer: Self-pay | Admitting: *Deleted

## 2013-10-11 MED ORDER — METOPROLOL SUCCINATE ER 100 MG PO TB24: 100.0000 mg | ORAL_TABLET | Freq: Every day | ORAL | Status: DC

## 2013-10-11 MED ORDER — METOPROLOL TARTRATE 100 MG PO TABS: 100.0000 mg | ORAL_TABLET | Freq: Every day | ORAL | Status: DC

## 2013-10-11 NOTE — Telephone Encounter (Signed)
Rx sent in accordingly. 

## 2013-10-11 NOTE — Telephone Encounter (Signed)
It should be metoprolol succinate 100 mg once daily.Marland KitchenMarland KitchenMarland Kitchen

## 2013-10-11 NOTE — Telephone Encounter (Signed)
Can you look in this patients eagle chart and clarify which form of metoprolol this patient should be taking? The pharmacy is requesting metoprolol succinate, but the office list has metoprolol tartrate. Please advise. Thanks, MI

## 2013-11-06 ENCOUNTER — Ambulatory Visit (INDEPENDENT_AMBULATORY_CARE_PROVIDER_SITE_OTHER): Payer: Medicare Other | Admitting: *Deleted

## 2013-11-06 DIAGNOSIS — Z7901 Long term (current) use of anticoagulants: Secondary | ICD-10-CM

## 2013-11-06 DIAGNOSIS — I4891 Unspecified atrial fibrillation: Secondary | ICD-10-CM | POA: Diagnosis not present

## 2013-11-06 DIAGNOSIS — Z5181 Encounter for therapeutic drug level monitoring: Secondary | ICD-10-CM

## 2013-11-06 LAB — POCT INR: INR: 2.3

## 2013-11-20 DIAGNOSIS — H35059 Retinal neovascularization, unspecified, unspecified eye: Secondary | ICD-10-CM | POA: Diagnosis not present

## 2013-11-20 DIAGNOSIS — H353290 Exudative age-related macular degeneration, unspecified eye, stage unspecified: Secondary | ICD-10-CM | POA: Diagnosis not present

## 2013-12-03 ENCOUNTER — Other Ambulatory Visit: Payer: Self-pay | Admitting: Cardiology

## 2013-12-04 ENCOUNTER — Ambulatory Visit (INDEPENDENT_AMBULATORY_CARE_PROVIDER_SITE_OTHER): Payer: Medicare Other | Admitting: Internal Medicine

## 2013-12-04 ENCOUNTER — Encounter: Payer: Self-pay | Admitting: Internal Medicine

## 2013-12-04 ENCOUNTER — Ambulatory Visit (INDEPENDENT_AMBULATORY_CARE_PROVIDER_SITE_OTHER): Payer: Medicare Other | Admitting: Pharmacist

## 2013-12-04 VITALS — BP 159/86 | HR 97 | Ht 67.5 in | Wt 152.0 lb

## 2013-12-04 DIAGNOSIS — Z950 Presence of cardiac pacemaker: Secondary | ICD-10-CM

## 2013-12-04 DIAGNOSIS — I495 Sick sinus syndrome: Secondary | ICD-10-CM

## 2013-12-04 DIAGNOSIS — Z5181 Encounter for therapeutic drug level monitoring: Secondary | ICD-10-CM

## 2013-12-04 DIAGNOSIS — I4891 Unspecified atrial fibrillation: Secondary | ICD-10-CM

## 2013-12-04 DIAGNOSIS — Z7901 Long term (current) use of anticoagulants: Secondary | ICD-10-CM | POA: Diagnosis not present

## 2013-12-04 LAB — MDC_IDC_ENUM_SESS_TYPE_INCLINIC
Battery Impedance: 800 Ohm
Battery Remaining Longevity: 72 mo
Battery Voltage: 2.78 V
Brady Statistic RV Percent Paced: 25 %
Date Time Interrogation Session: 20150204165126
Lead Channel Impedance Value: 643 Ohm
Lead Channel Impedance Value: 67 Ohm
Lead Channel Pacing Threshold Amplitude: 0.5 V
Lead Channel Pacing Threshold Pulse Width: 0.4 ms
Lead Channel Sensing Intrinsic Amplitude: 15.67 mV
Lead Channel Setting Pacing Amplitude: 2.5 V
Lead Channel Setting Pacing Pulse Width: 0.4 ms
Lead Channel Setting Sensing Sensitivity: 5.6 mV

## 2013-12-04 LAB — POCT INR: INR: 3.2

## 2013-12-04 NOTE — Progress Notes (Signed)
HPI Patricia Holt is referred today for ongoing evaluation and management of her PPM and now chronic atrial fibrillation. She is a very pleasant 80 yo woman who developed symptomatic bradycardia and underwent PPM in 2010. She initially developed atrial fibrillation in 2009. She has now had chronic atrial fibrillation and been treated with a strategy of rate control. Despite her advanced age, she has remained active. She has known mild aortic stenosis. No syncope. No edema.  No Known Allergies   Current Outpatient Prescriptions  Medication Sig Dispense Refill  . acetaminophen (TYLENOL) 100 MG/ML solution Take 10 mg/kg by mouth every 4 (four) hours as needed for fever.      . Chlorphen-Pseudoephed-APAP (CORICIDIN D PO) Take by mouth as needed.       . docusate sodium (COLACE) 50 MG capsule Take by mouth daily as needed.       . fluticasone (FLONASE) 50 MCG/ACT nasal spray Place 2 sprays into the nose daily.      . furosemide (LASIX) 40 MG tablet Take 20 mg by mouth as needed.       Marland Kitchen levothyroxine (SYNTHROID, LEVOTHROID) 75 MCG tablet Take 75 mcg by mouth daily.      Marland Kitchen losartan (COZAAR) 25 MG tablet Take 25 mg by mouth daily.      . metoprolol (LOPRESSOR) 100 MG tablet Take 1 tablet (100 mg total) by mouth daily.  30 tablet  6  . metoprolol succinate (TOPROL-XL) 100 MG 24 hr tablet Take 1 tablet (100 mg total) by mouth daily. Take with or immediately following a meal.  30 tablet  5  . Multiple Vitamins-Minerals (ICAPS PO) Take 1,000 mg by mouth.      . warfarin (COUMADIN) 5 MG tablet Take 5 mg by mouth daily. 7 mg on monday       No current facility-administered medications for this visit.     Past Medical History  Diagnosis Date  . HTN (hypertension)   . Hyperlipidemia   . Hypothyroidism   . Sinoatrial node dysfunction   . Aortic valve disorder   . Osteoporosis     , severe, hypercalciuria, Boniva since 2005, T10 compression fracture 2009  . Goiter   . Polymyalgia  rheumatica   . Carpal tunnel syndrome   . Other disorder of calcium metabolism   . Macular degeneration   . DJD (degenerative joint disease)   . Atrial fibrillation     paroxysmal, status post successful vessel cardioversion 5/09, skains,transient left ventricular systolic dysfunction, likely secondary to cardioversion/stunning of the myocardium, no evidence significant CAD, echo 7/09 shows normal ejection fraction  . Mitral regurgitation   . Chronic anticoagulation     ROS:   All systems reviewed and negative except as noted in the HPI.   Past Surgical History  Procedure Laterality Date  . Pacemaker insertion      Dual chamber permanent pacer insertion for tachy brady syndrome, 01/2009  . Femur fracture surgery    . Dilation and curettage of uterus    . Tonsillectomy and adenoidectomy    . Carpal tunnel release    . Total knee arthroplasty       Family History  Problem Relation Age of Onset  . Cancer Sister   . Diabetes Sister   . Hypertension Mother   . Hypertension Father      History   Social History  . Marital Status: Widowed    Spouse Name: N/A    Number of Children: N/A  .  Years of Education: N/A   Occupational History  . Not on file.   Social History Main Topics  . Smoking status: Never Smoker   . Smokeless tobacco: Not on file  . Alcohol Use: Not on file  . Drug Use: Not on file  . Sexual Activity: Not on file   Other Topics Concern  . Not on file   Social History Narrative  . No narrative on file     BP 159/86  Pulse 97  Ht 5' 7.5" (1.715 m)  Wt 152 lb (68.947 kg)  BMI 23.44 kg/m2  Physical Exam:  Well appearing elderly woman, NAD HEENT: Unremarkable Neck:  No JVD, no thyromegally Back:  No CVA tenderness Lungs:  Clear with no wheezes, rales, or rhonchi HEART:  Regular rate rhythm, grade 1/6 systolic murmur, no rubs, no clicks Abd:  soft, positive bowel sounds, no organomegally, no rebound, no guarding Ext:  2 plus pulses, no  edema, no cyanosis, no clubbing Skin:  No rashes no nodules Neuro:  CN II through XII intact, motor grossly intact    DEVICE  Normal device function.  See PaceArt for details.   Assess/Plan:

## 2013-12-04 NOTE — Assessment & Plan Note (Signed)
She now has chronic atrial fibrillation. Her ventricular rate is well controlled. She will continue her current medical therapy.

## 2013-12-04 NOTE — Assessment & Plan Note (Signed)
Her Medtronic DDD PM is working normally. She is programmed in the VVI mode. Will plan to recheck in several months.

## 2013-12-04 NOTE — Patient Instructions (Addendum)
Your physician recommends that you continue on your current medications as directed. Please refer to the Current Medication list given to you today.  Remote monitoring is used to monitor your Pacemaker of ICD from home. This monitoring reduces the number of office visits required to check your device to one time per year. It allows [place] to keep an eye on the functioning of your device to ensure it is working properly. You are scheduled for a device check from home on 03/07/14. You may send your transmission at any time that day. If you have a wireless device, the transmission will be sent automatically. After your physician reviews your transmission, you will receive a postcard with your next transmission date.  Your physician wants you to follow-up in: 1 year with Dr. Lovena Le.  You will receive a reminder letter in the mail two months in advance. If you don't receive a letter, please call our office to schedule the follow-up appointment.

## 2013-12-09 ENCOUNTER — Other Ambulatory Visit: Payer: Self-pay

## 2013-12-09 MED ORDER — LOSARTAN POTASSIUM 25 MG PO TABS: 25.0000 mg | ORAL_TABLET | Freq: Every day | ORAL | Status: DC

## 2013-12-11 DIAGNOSIS — H353290 Exudative age-related macular degeneration, unspecified eye, stage unspecified: Secondary | ICD-10-CM | POA: Diagnosis not present

## 2014-01-01 ENCOUNTER — Ambulatory Visit (INDEPENDENT_AMBULATORY_CARE_PROVIDER_SITE_OTHER): Payer: Medicare Other | Admitting: Pharmacist

## 2014-01-01 DIAGNOSIS — I4891 Unspecified atrial fibrillation: Secondary | ICD-10-CM

## 2014-01-01 DIAGNOSIS — Z5181 Encounter for therapeutic drug level monitoring: Secondary | ICD-10-CM | POA: Diagnosis not present

## 2014-01-01 DIAGNOSIS — Z7901 Long term (current) use of anticoagulants: Secondary | ICD-10-CM

## 2014-01-01 LAB — POCT INR: INR: 2.6

## 2014-01-08 DIAGNOSIS — H35059 Retinal neovascularization, unspecified, unspecified eye: Secondary | ICD-10-CM | POA: Diagnosis not present

## 2014-01-08 DIAGNOSIS — H43819 Vitreous degeneration, unspecified eye: Secondary | ICD-10-CM | POA: Diagnosis not present

## 2014-01-08 DIAGNOSIS — H353190 Nonexudative age-related macular degeneration, unspecified eye, stage unspecified: Secondary | ICD-10-CM | POA: Diagnosis not present

## 2014-01-08 DIAGNOSIS — H353290 Exudative age-related macular degeneration, unspecified eye, stage unspecified: Secondary | ICD-10-CM | POA: Diagnosis not present

## 2014-01-15 DIAGNOSIS — M25559 Pain in unspecified hip: Secondary | ICD-10-CM | POA: Diagnosis not present

## 2014-01-29 ENCOUNTER — Ambulatory Visit (INDEPENDENT_AMBULATORY_CARE_PROVIDER_SITE_OTHER): Payer: Medicare Other | Admitting: *Deleted

## 2014-01-29 DIAGNOSIS — Z5181 Encounter for therapeutic drug level monitoring: Secondary | ICD-10-CM | POA: Diagnosis not present

## 2014-01-29 DIAGNOSIS — Z7901 Long term (current) use of anticoagulants: Secondary | ICD-10-CM

## 2014-01-29 DIAGNOSIS — I4891 Unspecified atrial fibrillation: Secondary | ICD-10-CM

## 2014-01-29 LAB — POCT INR: INR: 3.7

## 2014-02-12 DIAGNOSIS — H353190 Nonexudative age-related macular degeneration, unspecified eye, stage unspecified: Secondary | ICD-10-CM | POA: Diagnosis not present

## 2014-02-12 DIAGNOSIS — H353290 Exudative age-related macular degeneration, unspecified eye, stage unspecified: Secondary | ICD-10-CM | POA: Diagnosis not present

## 2014-02-12 DIAGNOSIS — H35059 Retinal neovascularization, unspecified, unspecified eye: Secondary | ICD-10-CM | POA: Diagnosis not present

## 2014-02-12 DIAGNOSIS — H43819 Vitreous degeneration, unspecified eye: Secondary | ICD-10-CM | POA: Diagnosis not present

## 2014-02-12 DIAGNOSIS — M25559 Pain in unspecified hip: Secondary | ICD-10-CM | POA: Diagnosis not present

## 2014-02-14 ENCOUNTER — Ambulatory Visit (INDEPENDENT_AMBULATORY_CARE_PROVIDER_SITE_OTHER): Payer: Medicare Other | Admitting: *Deleted

## 2014-02-14 DIAGNOSIS — I4891 Unspecified atrial fibrillation: Secondary | ICD-10-CM

## 2014-02-14 DIAGNOSIS — Z5181 Encounter for therapeutic drug level monitoring: Secondary | ICD-10-CM

## 2014-02-14 DIAGNOSIS — Z7901 Long term (current) use of anticoagulants: Secondary | ICD-10-CM

## 2014-02-14 LAB — POCT INR: INR: 2.6

## 2014-03-07 ENCOUNTER — Ambulatory Visit (INDEPENDENT_AMBULATORY_CARE_PROVIDER_SITE_OTHER): Payer: Medicare Other | Admitting: *Deleted

## 2014-03-07 ENCOUNTER — Encounter: Payer: Self-pay | Admitting: Internal Medicine

## 2014-03-07 DIAGNOSIS — I4891 Unspecified atrial fibrillation: Secondary | ICD-10-CM

## 2014-03-12 ENCOUNTER — Ambulatory Visit (INDEPENDENT_AMBULATORY_CARE_PROVIDER_SITE_OTHER): Payer: Medicare Other | Admitting: *Deleted

## 2014-03-12 DIAGNOSIS — I4891 Unspecified atrial fibrillation: Secondary | ICD-10-CM

## 2014-03-12 DIAGNOSIS — Z5181 Encounter for therapeutic drug level monitoring: Secondary | ICD-10-CM

## 2014-03-12 DIAGNOSIS — Z7901 Long term (current) use of anticoagulants: Secondary | ICD-10-CM | POA: Diagnosis not present

## 2014-03-12 LAB — POCT INR: INR: 2.5

## 2014-03-19 DIAGNOSIS — Z1331 Encounter for screening for depression: Secondary | ICD-10-CM | POA: Diagnosis not present

## 2014-03-19 DIAGNOSIS — I10 Essential (primary) hypertension: Secondary | ICD-10-CM | POA: Diagnosis not present

## 2014-03-19 DIAGNOSIS — J309 Allergic rhinitis, unspecified: Secondary | ICD-10-CM | POA: Diagnosis not present

## 2014-03-19 DIAGNOSIS — Z23 Encounter for immunization: Secondary | ICD-10-CM | POA: Diagnosis not present

## 2014-03-19 DIAGNOSIS — E039 Hypothyroidism, unspecified: Secondary | ICD-10-CM | POA: Diagnosis not present

## 2014-03-20 LAB — MDC_IDC_ENUM_SESS_TYPE_REMOTE
Battery Impedance: 1065 Ohm
Battery Remaining Longevity: 63 mo
Battery Voltage: 2.77 V
Brady Statistic RV Percent Paced: 18 %
Date Time Interrogation Session: 20150508114549
Lead Channel Impedance Value: 67 Ohm
Lead Channel Impedance Value: 702 Ohm
Lead Channel Pacing Threshold Amplitude: 0.875 V
Lead Channel Pacing Threshold Pulse Width: 0.4 ms
Lead Channel Sensing Intrinsic Amplitude: 5.6 mV
Lead Channel Setting Pacing Amplitude: 2 V
Lead Channel Setting Pacing Pulse Width: 0.4 ms
Lead Channel Setting Sensing Sensitivity: 5.6 mV

## 2014-03-21 NOTE — Progress Notes (Signed)
Remote pacemaker transmission.   

## 2014-03-26 DIAGNOSIS — H353190 Nonexudative age-related macular degeneration, unspecified eye, stage unspecified: Secondary | ICD-10-CM | POA: Diagnosis not present

## 2014-03-26 DIAGNOSIS — H43819 Vitreous degeneration, unspecified eye: Secondary | ICD-10-CM | POA: Diagnosis not present

## 2014-03-26 DIAGNOSIS — H353290 Exudative age-related macular degeneration, unspecified eye, stage unspecified: Secondary | ICD-10-CM | POA: Diagnosis not present

## 2014-03-26 DIAGNOSIS — H35059 Retinal neovascularization, unspecified, unspecified eye: Secondary | ICD-10-CM | POA: Diagnosis not present

## 2014-03-28 ENCOUNTER — Encounter: Payer: Self-pay | Admitting: Cardiology

## 2014-04-15 ENCOUNTER — Other Ambulatory Visit: Payer: Self-pay | Admitting: Cardiology

## 2014-04-16 NOTE — Telephone Encounter (Signed)
Last OV note shows Lopressor 100 mg daily, but it may have been added into our system incorrectly. If pt has been taking Toprol Xl 100 mg for quite sometime then she should continue it.

## 2014-04-23 ENCOUNTER — Ambulatory Visit (INDEPENDENT_AMBULATORY_CARE_PROVIDER_SITE_OTHER): Payer: Medicare Other | Admitting: *Deleted

## 2014-04-23 DIAGNOSIS — Z7901 Long term (current) use of anticoagulants: Secondary | ICD-10-CM

## 2014-04-23 DIAGNOSIS — I4891 Unspecified atrial fibrillation: Secondary | ICD-10-CM

## 2014-04-23 DIAGNOSIS — Z5181 Encounter for therapeutic drug level monitoring: Secondary | ICD-10-CM

## 2014-04-23 LAB — POCT INR: INR: 2.8

## 2014-04-25 ENCOUNTER — Other Ambulatory Visit: Payer: Self-pay

## 2014-04-25 MED ORDER — METOPROLOL SUCCINATE ER 100 MG PO TB24: 100.0000 mg | ORAL_TABLET | Freq: Every day | ORAL | Status: DC

## 2014-05-05 ENCOUNTER — Other Ambulatory Visit: Payer: Self-pay | Admitting: Cardiology

## 2014-05-21 DIAGNOSIS — H35059 Retinal neovascularization, unspecified, unspecified eye: Secondary | ICD-10-CM | POA: Diagnosis not present

## 2014-05-21 DIAGNOSIS — H353190 Nonexudative age-related macular degeneration, unspecified eye, stage unspecified: Secondary | ICD-10-CM | POA: Diagnosis not present

## 2014-05-21 DIAGNOSIS — H353290 Exudative age-related macular degeneration, unspecified eye, stage unspecified: Secondary | ICD-10-CM | POA: Diagnosis not present

## 2014-06-04 ENCOUNTER — Ambulatory Visit (INDEPENDENT_AMBULATORY_CARE_PROVIDER_SITE_OTHER): Payer: Medicare Other | Admitting: *Deleted

## 2014-06-04 DIAGNOSIS — I4891 Unspecified atrial fibrillation: Secondary | ICD-10-CM | POA: Diagnosis not present

## 2014-06-04 DIAGNOSIS — Z5181 Encounter for therapeutic drug level monitoring: Secondary | ICD-10-CM

## 2014-06-04 DIAGNOSIS — Z7901 Long term (current) use of anticoagulants: Secondary | ICD-10-CM | POA: Diagnosis not present

## 2014-06-04 LAB — POCT INR: INR: 2.3

## 2014-06-09 ENCOUNTER — Other Ambulatory Visit: Payer: Self-pay | Admitting: Cardiology

## 2014-06-09 ENCOUNTER — Ambulatory Visit (INDEPENDENT_AMBULATORY_CARE_PROVIDER_SITE_OTHER): Payer: Medicare Other | Admitting: *Deleted

## 2014-06-09 DIAGNOSIS — I4891 Unspecified atrial fibrillation: Secondary | ICD-10-CM | POA: Diagnosis not present

## 2014-06-09 DIAGNOSIS — I495 Sick sinus syndrome: Secondary | ICD-10-CM | POA: Diagnosis not present

## 2014-06-09 DIAGNOSIS — I480 Paroxysmal atrial fibrillation: Secondary | ICD-10-CM

## 2014-06-09 NOTE — Progress Notes (Signed)
Remote pacemaker transmission.   

## 2014-06-19 LAB — MDC_IDC_ENUM_SESS_TYPE_REMOTE
Battery Impedance: 1089 Ohm
Battery Remaining Longevity: 62 mo
Battery Voltage: 2.77 V
Brady Statistic RV Percent Paced: 19 %
Date Time Interrogation Session: 20150810121902
Lead Channel Impedance Value: 647 Ohm
Lead Channel Impedance Value: 67 Ohm
Lead Channel Pacing Threshold Amplitude: 0.75 V
Lead Channel Pacing Threshold Pulse Width: 0.4 ms
Lead Channel Sensing Intrinsic Amplitude: 11.2 mV
Lead Channel Setting Pacing Amplitude: 2 V
Lead Channel Setting Pacing Pulse Width: 0.4 ms
Lead Channel Setting Sensing Sensitivity: 5.6 mV

## 2014-06-26 ENCOUNTER — Encounter: Payer: Self-pay | Admitting: Cardiology

## 2014-07-01 DIAGNOSIS — D3130 Benign neoplasm of unspecified choroid: Secondary | ICD-10-CM | POA: Diagnosis not present

## 2014-07-01 DIAGNOSIS — Z961 Presence of intraocular lens: Secondary | ICD-10-CM | POA: Diagnosis not present

## 2014-07-01 DIAGNOSIS — H43399 Other vitreous opacities, unspecified eye: Secondary | ICD-10-CM | POA: Diagnosis not present

## 2014-07-01 DIAGNOSIS — H353290 Exudative age-related macular degeneration, unspecified eye, stage unspecified: Secondary | ICD-10-CM | POA: Diagnosis not present

## 2014-07-03 ENCOUNTER — Encounter: Payer: Self-pay | Admitting: Internal Medicine

## 2014-07-09 ENCOUNTER — Other Ambulatory Visit: Payer: Self-pay | Admitting: Cardiology

## 2014-07-16 ENCOUNTER — Ambulatory Visit (INDEPENDENT_AMBULATORY_CARE_PROVIDER_SITE_OTHER): Payer: Medicare Other | Admitting: Pharmacist

## 2014-07-16 DIAGNOSIS — I4891 Unspecified atrial fibrillation: Secondary | ICD-10-CM | POA: Diagnosis not present

## 2014-07-16 DIAGNOSIS — Z5181 Encounter for therapeutic drug level monitoring: Secondary | ICD-10-CM

## 2014-07-16 DIAGNOSIS — Z7901 Long term (current) use of anticoagulants: Secondary | ICD-10-CM

## 2014-07-16 LAB — POCT INR: INR: 3

## 2014-08-06 DIAGNOSIS — H3532 Exudative age-related macular degeneration: Secondary | ICD-10-CM | POA: Diagnosis not present

## 2014-08-06 DIAGNOSIS — H3531 Nonexudative age-related macular degeneration: Secondary | ICD-10-CM | POA: Diagnosis not present

## 2014-08-06 DIAGNOSIS — H35052 Retinal neovascularization, unspecified, left eye: Secondary | ICD-10-CM | POA: Diagnosis not present

## 2014-08-11 ENCOUNTER — Other Ambulatory Visit: Payer: Self-pay | Admitting: Cardiology

## 2014-08-13 ENCOUNTER — Other Ambulatory Visit: Payer: Self-pay

## 2014-08-13 DIAGNOSIS — M1611 Unilateral primary osteoarthritis, right hip: Secondary | ICD-10-CM | POA: Diagnosis not present

## 2014-08-19 DIAGNOSIS — M1611 Unilateral primary osteoarthritis, right hip: Secondary | ICD-10-CM | POA: Diagnosis not present

## 2014-08-22 DIAGNOSIS — Z23 Encounter for immunization: Secondary | ICD-10-CM | POA: Diagnosis not present

## 2014-08-24 ENCOUNTER — Other Ambulatory Visit: Payer: Self-pay | Admitting: Cardiology

## 2014-08-25 ENCOUNTER — Other Ambulatory Visit: Payer: Self-pay | Admitting: Cardiology

## 2014-08-27 ENCOUNTER — Ambulatory Visit (INDEPENDENT_AMBULATORY_CARE_PROVIDER_SITE_OTHER): Payer: Medicare Other | Admitting: Pharmacist

## 2014-08-27 DIAGNOSIS — Z5181 Encounter for therapeutic drug level monitoring: Secondary | ICD-10-CM

## 2014-08-27 DIAGNOSIS — I4891 Unspecified atrial fibrillation: Secondary | ICD-10-CM

## 2014-08-27 DIAGNOSIS — Z7901 Long term (current) use of anticoagulants: Secondary | ICD-10-CM

## 2014-08-27 LAB — POCT INR: INR: 3.1

## 2014-09-08 ENCOUNTER — Other Ambulatory Visit: Payer: Self-pay | Admitting: Cardiology

## 2014-09-11 ENCOUNTER — Encounter: Payer: Self-pay | Admitting: Internal Medicine

## 2014-09-11 ENCOUNTER — Ambulatory Visit (INDEPENDENT_AMBULATORY_CARE_PROVIDER_SITE_OTHER): Payer: Medicare Other | Admitting: *Deleted

## 2014-09-11 DIAGNOSIS — I4891 Unspecified atrial fibrillation: Secondary | ICD-10-CM

## 2014-09-12 NOTE — Progress Notes (Signed)
Remote pacemaker transmission.   

## 2014-09-14 LAB — MDC_IDC_ENUM_SESS_TYPE_REMOTE
Battery Impedance: 1194 Ohm
Battery Remaining Longevity: 59 mo
Battery Voltage: 2.77 V
Brady Statistic RV Percent Paced: 19 %
Date Time Interrogation Session: 20151112160749
Lead Channel Impedance Value: 645 Ohm
Lead Channel Impedance Value: 67 Ohm
Lead Channel Pacing Threshold Amplitude: 0.625 V
Lead Channel Pacing Threshold Pulse Width: 0.4 ms
Lead Channel Sensing Intrinsic Amplitude: 22.4 mV
Lead Channel Setting Pacing Amplitude: 2 V
Lead Channel Setting Pacing Pulse Width: 0.4 ms
Lead Channel Setting Sensing Sensitivity: 5.6 mV

## 2014-09-17 ENCOUNTER — Ambulatory Visit (INDEPENDENT_AMBULATORY_CARE_PROVIDER_SITE_OTHER): Payer: Medicare Other | Admitting: Cardiology

## 2014-09-17 ENCOUNTER — Encounter: Payer: Self-pay | Admitting: Cardiology

## 2014-09-17 VITALS — BP 122/80 | HR 94 | Ht 67.5 in | Wt 151.0 lb

## 2014-09-17 DIAGNOSIS — I10 Essential (primary) hypertension: Secondary | ICD-10-CM | POA: Diagnosis not present

## 2014-09-17 DIAGNOSIS — I480 Paroxysmal atrial fibrillation: Secondary | ICD-10-CM | POA: Diagnosis not present

## 2014-09-17 DIAGNOSIS — Z7901 Long term (current) use of anticoagulants: Secondary | ICD-10-CM | POA: Diagnosis not present

## 2014-09-17 DIAGNOSIS — Z950 Presence of cardiac pacemaker: Secondary | ICD-10-CM | POA: Diagnosis not present

## 2014-09-17 DIAGNOSIS — Z01810 Encounter for preprocedural cardiovascular examination: Secondary | ICD-10-CM

## 2014-09-17 NOTE — Progress Notes (Signed)
[facility] [address], [address] [place], [place]  [address] Phone: [phone] Fax:  [phone]  Date:  09/17/2014   ID:  Patricia Holt Aug 03, 1924, MRN [id number]  PCP:  Irven Shelling, MD   History of Present Illness: Patricia Holt is a 80 y.o. female persistent atrial fibrillation, pacemaker dual chamber because of prior tachycardia bradycardia syndrome in April of 2010, chronic anticoagulation with recent bout of cardiomyopathy following cardioversion that spontaneously resolved here for followup.  On 11/30/11-her lisinopril was stopped because of chronic cough. Losartan was started. Occasional cough still persisted. Aortic valve showed moderate aortic insufficiency. 8/11 echocardiogram. Furosemide 40 mg was started at last clinic visit to be taken on as-needed basis if weight increased.  Loosing balance so stopped golf. Walking daily. No changes with breathing.   09/17/14 - has occasional feeling of dizziness. Overall asymptomatic. No shortness of breath, no chest pain. Doing quite well. About to undergo right hip operation in January.   Wt Readings from Last 3 Encounters:  09/17/14 151 lb (68.493 kg)  12/04/13 152 lb (68.947 kg)  08/14/13 151 lb (68.493 kg)     Past Medical History  Diagnosis Date  . HTN (hypertension)   . Hyperlipidemia   . Hypothyroidism   . Sinoatrial node dysfunction   . Aortic valve disorder   . Osteoporosis     , severe, hypercalciuria, Boniva since 2005, T10 compression fracture 2009  . Goiter   . Polymyalgia rheumatica   . Carpal tunnel syndrome   . Other disorder of calcium metabolism   . Macular degeneration   . DJD (degenerative joint disease)   . Atrial fibrillation     paroxysmal, status post successful vessel cardioversion 5/09, Selso Mannor,transient left ventricular systolic dysfunction, likely secondary to cardioversion/stunning of the myocardium, no evidence significant CAD, echo 7/09 shows normal ejection fraction  .  Mitral regurgitation   . Chronic anticoagulation     Past Surgical History  Procedure Laterality Date  . Pacemaker insertion      Dual chamber permanent pacer insertion for tachy brady syndrome, 01/2009  . Femur fracture surgery    . Dilation and curettage of uterus    . Tonsillectomy and adenoidectomy    . Carpal tunnel release    . Total knee arthroplasty      Current Outpatient Prescriptions  Medication Sig Dispense Refill  . acetaminophen (TYLENOL) 100 MG/ML solution Take 10 mg/kg by mouth every 4 (four) hours as needed for fever.    . Chlorphen-Pseudoephed-APAP (CORICIDIN D PO) Take by mouth as needed.     . docusate sodium (COLACE) 50 MG capsule Take by mouth daily as needed.     . fluticasone (FLONASE) 50 MCG/ACT nasal spray Place 2 sprays into the nose daily.    . furosemide (LASIX) 40 MG tablet Take 20 mg by mouth as needed.     Marland Kitchen levothyroxine (SYNTHROID, LEVOTHROID) 25 MCG tablet   3  . losartan (COZAAR) 25 MG tablet TAKE 1 TABLET BY MOUTH DAILY 30 tablet 0  . metoprolol succinate (TOPROL-XL) 100 MG 24 hr tablet TAKE 1 TABLET BY MOUTH DAILY WITH OR IMMEDIATELY FOLLOWING A MEAL 30 tablet 1  . Multiple Vitamins-Minerals (ICAPS PO) Take 1,000 mg by mouth.    . warfarin (COUMADIN) 5 MG tablet USE AS DIRECTED 40 tablet 3   No current facility-administered medications for this visit.    Allergies:   No Known Allergies  Social History:  The patient  reports  that she has never smoked. She does not have any smokeless tobacco history on file.   ROS:  Please see the history of present illness.   Knee pain when walking. Using cane. Has some balance issues so she does not play golf. No chest pain, no shortness of breath. No bleeding with Coumadin. No strokelike symptoms.   All other systems reviewed and negative.   PHYSICAL EXAM: VS:  BP 122/80 mmHg  Pulse 94  Ht 5' 7.5" (1.715 m)  Wt 151 lb (68.493 kg)  BMI 23.29 kg/m2 Well nourished, well developed, in no acute  distress HEENT: normal Neck: no JVD Cardiac:  Irregularly irregular, normal rate; 1/6 diastolic murmur right upper sternal border Lungs:  clear to auscultation bilaterally, no wheezing, rhonchi or rales Abd: soft, nontender, no hepatomegaly Ext: no edema Skin: warm and dry Neuro: no focal abnormalities noted  EKG: 09/17/14- Atrial fibrillation 94, left axis deviation,no ST segment changes, no significant change from prior  ASSESSMENT AND PLAN:  1. Preoperative risk stratification prior to right hip replacement- given her lack of anginal symptoms, no significant shortness of breath, no heart failure symptoms, atrial fibrillation under good rate control, I am comfortable with her proceeding with right hip surgery with low to moderate cardiac risk based mainly on age. No further testing warranted. 2. Atrial fibrillation-persistent, doing well. Well rate controlled on current medications. No changes made. CHADS-VASc - 4 (hypertension, age, female) I would be comfortable with holding her Coumadin for 5 days prior to surgery and starting immediately after surgery. 3. Pacemaker-functioning well. 4. Chronic anticoagulation-no bleeding. Continue to monitor closely. 5. Hypertension-currently on losartan. She's not had any significant change with her dry cough. She thinks that it is mucus she has increased mucous production in the morning. This is likely. 6. We will see her back in 6 months for followup. 7. Right hip pain. 8. Aortic regurgitation-mild to moderate. No change clinically. No need for repeat echocardiogram at this time.  Signed, Candee Furbish, MD [facility]  09/17/2014 11:36 AM

## 2014-09-17 NOTE — Patient Instructions (Signed)
The current medical regimen is effective;  continue present plan and medications.  Follow up in 6 months with Dr. Skains.  You will receive a letter in the mail 2 months before you are due.  Please call us when you receive this letter to schedule your follow up appointment.  

## 2014-09-24 ENCOUNTER — Encounter: Payer: Self-pay | Admitting: Cardiology

## 2014-10-01 DIAGNOSIS — M169 Osteoarthritis of hip, unspecified: Secondary | ICD-10-CM | POA: Diagnosis not present

## 2014-10-01 DIAGNOSIS — I482 Chronic atrial fibrillation: Secondary | ICD-10-CM | POA: Diagnosis not present

## 2014-10-01 DIAGNOSIS — Z0000 Encounter for general adult medical examination without abnormal findings: Secondary | ICD-10-CM | POA: Diagnosis not present

## 2014-10-01 DIAGNOSIS — I10 Essential (primary) hypertension: Secondary | ICD-10-CM | POA: Diagnosis not present

## 2014-10-01 DIAGNOSIS — Z1389 Encounter for screening for other disorder: Secondary | ICD-10-CM | POA: Diagnosis not present

## 2014-10-06 ENCOUNTER — Other Ambulatory Visit: Payer: Self-pay | Admitting: Cardiology

## 2014-10-07 ENCOUNTER — Telehealth: Payer: Self-pay

## 2014-10-07 MED ORDER — WARFARIN SODIUM 5 MG PO TABS: ORAL_TABLET | ORAL | Status: DC

## 2014-10-07 NOTE — Telephone Encounter (Signed)
Refill on warfarin

## 2014-10-07 NOTE — Telephone Encounter (Signed)
Refill sent to pharmacy as requested

## 2014-10-08 ENCOUNTER — Ambulatory Visit (INDEPENDENT_AMBULATORY_CARE_PROVIDER_SITE_OTHER): Payer: Medicare Other | Admitting: *Deleted

## 2014-10-08 DIAGNOSIS — Z7901 Long term (current) use of anticoagulants: Secondary | ICD-10-CM

## 2014-10-08 DIAGNOSIS — I4891 Unspecified atrial fibrillation: Secondary | ICD-10-CM

## 2014-10-08 DIAGNOSIS — I480 Paroxysmal atrial fibrillation: Secondary | ICD-10-CM | POA: Diagnosis not present

## 2014-10-08 DIAGNOSIS — Z5181 Encounter for therapeutic drug level monitoring: Secondary | ICD-10-CM | POA: Diagnosis not present

## 2014-10-08 LAB — POCT INR: INR: 3.7

## 2014-10-22 ENCOUNTER — Ambulatory Visit (INDEPENDENT_AMBULATORY_CARE_PROVIDER_SITE_OTHER): Payer: Medicare Other | Admitting: *Deleted

## 2014-10-22 DIAGNOSIS — Z7901 Long term (current) use of anticoagulants: Secondary | ICD-10-CM | POA: Diagnosis not present

## 2014-10-22 DIAGNOSIS — Z5181 Encounter for therapeutic drug level monitoring: Secondary | ICD-10-CM | POA: Diagnosis not present

## 2014-10-22 DIAGNOSIS — I480 Paroxysmal atrial fibrillation: Secondary | ICD-10-CM | POA: Diagnosis not present

## 2014-10-22 DIAGNOSIS — I4891 Unspecified atrial fibrillation: Secondary | ICD-10-CM | POA: Diagnosis not present

## 2014-10-22 LAB — POCT INR: INR: 4

## 2014-10-27 ENCOUNTER — Other Ambulatory Visit: Payer: Self-pay | Admitting: Cardiology

## 2014-10-28 NOTE — Telephone Encounter (Signed)
Patricia Furbish, MD at 09/17/2014 10:57 AM  metoprolol succinate (TOPROL-XL) 100 MG 24 hr tablet  TAKE 1 TABLET BY MOUTH DAILY WITH OR IMMEDIATELY FOLLOWING A MEAL  Patient Instructions     The current medical regimen is effective; continue present plan and medications.

## 2014-11-04 ENCOUNTER — Other Ambulatory Visit: Payer: Self-pay | Admitting: Cardiology

## 2014-11-05 ENCOUNTER — Ambulatory Visit (INDEPENDENT_AMBULATORY_CARE_PROVIDER_SITE_OTHER): Payer: Medicare Other | Admitting: Pharmacist

## 2014-11-05 DIAGNOSIS — H3532 Exudative age-related macular degeneration: Secondary | ICD-10-CM | POA: Diagnosis not present

## 2014-11-05 DIAGNOSIS — Z5181 Encounter for therapeutic drug level monitoring: Secondary | ICD-10-CM

## 2014-11-05 DIAGNOSIS — I480 Paroxysmal atrial fibrillation: Secondary | ICD-10-CM | POA: Diagnosis not present

## 2014-11-05 DIAGNOSIS — Z7901 Long term (current) use of anticoagulants: Secondary | ICD-10-CM

## 2014-11-05 DIAGNOSIS — I4891 Unspecified atrial fibrillation: Secondary | ICD-10-CM

## 2014-11-05 DIAGNOSIS — H35052 Retinal neovascularization, unspecified, left eye: Secondary | ICD-10-CM | POA: Diagnosis not present

## 2014-11-05 LAB — POCT INR: INR: 2.5

## 2014-11-12 DIAGNOSIS — M1611 Unilateral primary osteoarthritis, right hip: Secondary | ICD-10-CM | POA: Diagnosis not present

## 2014-11-19 ENCOUNTER — Ambulatory Visit (INDEPENDENT_AMBULATORY_CARE_PROVIDER_SITE_OTHER): Payer: Medicare Other | Admitting: *Deleted

## 2014-11-19 DIAGNOSIS — Z5181 Encounter for therapeutic drug level monitoring: Secondary | ICD-10-CM | POA: Diagnosis not present

## 2014-11-19 DIAGNOSIS — Z7901 Long term (current) use of anticoagulants: Secondary | ICD-10-CM

## 2014-11-19 DIAGNOSIS — I480 Paroxysmal atrial fibrillation: Secondary | ICD-10-CM

## 2014-11-19 DIAGNOSIS — I4891 Unspecified atrial fibrillation: Secondary | ICD-10-CM

## 2014-11-19 LAB — POCT INR: INR: 2.2

## 2014-11-28 ENCOUNTER — Other Ambulatory Visit: Payer: Self-pay | Admitting: Orthopaedic Surgery

## 2014-12-02 NOTE — Pre-Procedure Instructions (Addendum)
Patricia Holt  12/02/2014   Your procedure is scheduled on:  Tuesday, February 16th  Report to [facility] Admitting at 730 AM.  Call this number if you have problems the morning of surgery: [phone]   Remember:   Do not eat food or drink liquids after midnight.   Take these medicines the morning of surgery with A SIP OF WATER: Synthroid, Toprol, flonase Coumadin to be stop 5 days prior to surgery 12/11/14/  Do not wear jewelry, make-up or nail polish.  Do not wear lotions, powders, or perfumes.  deodorant.  Do not shave 48 hours prior to surgery. Men may shave face and neck.  Do not bring valuables to the hospital.  [facility] is not responsible   for any belongings or valuables.               Contacts, dentures or bridgework may not be worn into surgery.  Leave suitcase in the car. After surgery it may be brought to your room.  For patients admitted to the hospital, discharge time is determined by your  treatment team.          Please read over the following fact sheets that you were given: Pain Booklet, Coughing and Deep Breathing, Blood Transfusion Information, MRSA Information and Surgical Site Infection Prevention  [facility] - Preparing for Surgery  Before surgery, you can play an important role.  Because skin is not sterile, your skin needs to be as free of germs as possible.  You can reduce the number of germs on you skin by washing with CHG (chlorahexidine gluconate) soap before surgery.  CHG is an antiseptic cleaner which kills germs and bonds with the skin to continue killing germs even after washing.  Please DO NOT use if you have an allergy to CHG or antibacterial soaps.  If your skin becomes reddened/irritated stop using the CHG and inform your nurse when you arrive at Short Stay.  Do not shave (including legs and underarms) for at least 48 hours prior to the first CHG shower.  You may shave your face.  Please follow these instructions carefully:   1.   Shower with CHG Soap the night before surgery and the morning of Surgery.  2.  If you choose to wash your hair, wash your hair first as usual with your normal shampoo.  3.  After you shampoo, rinse your hair and body thoroughly to remove the shampoo.  4.  Use CHG as you would any other liquid soap.  You can apply CHG directly to the skin and wash gently with scrungie or a clean washcloth.  5.  Apply the CHG Soap to your body ONLY FROM THE NECK DOWN.  Do not use on open wounds or open sores.  Avoid contact with your eyes, ears, mouth and genitals (private parts).  Wash genitals (private parts) with your normal soap.  6.  Wash thoroughly, paying special attention to the area where your surgery will be performed.  7.  Thoroughly rinse your body with warm water from the neck down.  8.  DO NOT shower/wash with your normal soap after using and rinsing off the CHG Soap.  9.  Pat yourself dry with a clean towel.            10.  Wear clean pajamas.            11.  Place clean sheets on your bed the night of your first shower and do not  sleep with pets.  Day of Surgery  Do not apply any lotions/deoderants the morning of surgery.  Please wear clean clothes to the hospital/surgery center.

## 2014-12-03 ENCOUNTER — Encounter (HOSPITAL_COMMUNITY)
Admission: RE | Admit: 2014-12-03 | Discharge: 2014-12-03 | Disposition: A | Discharge status: Home / Self Care | Payer: Medicare Other | Source: Ambulatory Visit | Attending: Orthopaedic Surgery | Admitting: Orthopaedic Surgery

## 2014-12-03 ENCOUNTER — Encounter (HOSPITAL_COMMUNITY): Payer: Self-pay

## 2014-12-03 DIAGNOSIS — Z01818 Encounter for other preprocedural examination: Secondary | ICD-10-CM | POA: Diagnosis not present

## 2014-12-03 DIAGNOSIS — E785 Hyperlipidemia, unspecified: Secondary | ICD-10-CM | POA: Insufficient documentation

## 2014-12-03 DIAGNOSIS — I10 Essential (primary) hypertension: Secondary | ICD-10-CM | POA: Diagnosis not present

## 2014-12-03 DIAGNOSIS — I495 Sick sinus syndrome: Secondary | ICD-10-CM | POA: Diagnosis not present

## 2014-12-03 DIAGNOSIS — M1611 Unilateral primary osteoarthritis, right hip: Secondary | ICD-10-CM | POA: Insufficient documentation

## 2014-12-03 DIAGNOSIS — R918 Other nonspecific abnormal finding of lung field: Secondary | ICD-10-CM | POA: Diagnosis not present

## 2014-12-03 DIAGNOSIS — I080 Rheumatic disorders of both mitral and aortic valves: Secondary | ICD-10-CM | POA: Insufficient documentation

## 2014-12-03 DIAGNOSIS — Z01812 Encounter for preprocedural laboratory examination: Secondary | ICD-10-CM | POA: Insufficient documentation

## 2014-12-03 DIAGNOSIS — E039 Hypothyroidism, unspecified: Secondary | ICD-10-CM | POA: Insufficient documentation

## 2014-12-03 DIAGNOSIS — R911 Solitary pulmonary nodule: Secondary | ICD-10-CM | POA: Diagnosis not present

## 2014-12-03 DIAGNOSIS — I4891 Unspecified atrial fibrillation: Secondary | ICD-10-CM | POA: Insufficient documentation

## 2014-12-03 DIAGNOSIS — Z0183 Encounter for blood typing: Secondary | ICD-10-CM | POA: Insufficient documentation

## 2014-12-03 DIAGNOSIS — Z7901 Long term (current) use of anticoagulants: Secondary | ICD-10-CM | POA: Diagnosis not present

## 2014-12-03 DIAGNOSIS — Z950 Presence of cardiac pacemaker: Secondary | ICD-10-CM | POA: Diagnosis not present

## 2014-12-03 DIAGNOSIS — I771 Stricture of artery: Secondary | ICD-10-CM | POA: Insufficient documentation

## 2014-12-03 HISTORY — DX: Cardiac arrhythmia, unspecified: I49.9

## 2014-12-03 HISTORY — DX: Gastro-esophageal reflux disease without esophagitis: K21.9

## 2014-12-03 HISTORY — DX: Presence of cardiac pacemaker: Z95.0

## 2014-12-03 HISTORY — DX: Other specified disorders of nose and nasal sinuses: J34.89

## 2014-12-03 LAB — URINALYSIS, ROUTINE W REFLEX MICROSCOPIC
Bilirubin Urine: NEGATIVE
Glucose, UA: NEGATIVE mg/dL
Hgb urine dipstick: NEGATIVE
Ketones, ur: NEGATIVE mg/dL
Leukocytes, UA: NEGATIVE
Nitrite: NEGATIVE
Protein, ur: NEGATIVE mg/dL
Specific Gravity, Urine: 1.01 (ref 1.005–1.030)
Urobilinogen, UA: 1 mg/dL (ref 0.0–1.0)
pH: 7.5 (ref 5.0–8.0)

## 2014-12-03 LAB — CBC WITH DIFFERENTIAL/PLATELET
Basophils Absolute: 0 10*3/uL (ref 0.0–0.1)
Basophils Relative: 0 % (ref 0–1)
Eosinophils Absolute: 0.2 10*3/uL (ref 0.0–0.7)
Eosinophils Relative: 2 % (ref 0–5)
HCT: 38.3 % (ref 36.0–46.0)
Hemoglobin: 12.4 g/dL (ref 12.0–15.0)
Lymphocytes Relative: 17 % (ref 12–46)
Lymphs Abs: 1.3 10*3/uL (ref 0.7–4.0)
MCH: 29.2 pg (ref 26.0–34.0)
MCHC: 32.4 g/dL (ref 30.0–36.0)
MCV: 90.1 fL (ref 78.0–100.0)
Monocytes Absolute: 0.8 10*3/uL (ref 0.1–1.0)
Monocytes Relative: 10 % (ref 3–12)
Neutro Abs: 5.5 10*3/uL (ref 1.7–7.7)
Neutrophils Relative %: 71 % (ref 43–77)
Platelets: 363 10*3/uL (ref 150–400)
RBC: 4.25 MIL/uL (ref 3.87–5.11)
RDW: 14.6 % (ref 11.5–15.5)
WBC: 7.8 10*3/uL (ref 4.0–10.5)

## 2014-12-03 LAB — ABO/RH: ABO/RH(D): O POS

## 2014-12-03 LAB — BASIC METABOLIC PANEL
Anion gap: 9 (ref 5–15)
BUN: 13 mg/dL (ref 6–23)
CO2: 28 mmol/L (ref 19–32)
Calcium: 10 mg/dL (ref 8.4–10.5)
Chloride: 101 mmol/L (ref 96–112)
Creatinine, Ser: 0.7 mg/dL (ref 0.50–1.10)
GFR calc Af Amer: 86 mL/min — ABNORMAL LOW (ref >90)
GFR calc non Af Amer: 75 mL/min — ABNORMAL LOW (ref >90)
Glucose, Bld: 79 mg/dL (ref 70–99)
Potassium: 4.3 mmol/L (ref 3.5–5.1)
Sodium: 138 mmol/L (ref 135–145)

## 2014-12-03 LAB — PROTIME-INR
INR: 3.11 — ABNORMAL HIGH (ref 0.00–1.49)
Prothrombin Time: 32.3 seconds — ABNORMAL HIGH (ref 11.6–15.2)

## 2014-12-03 LAB — TYPE AND SCREEN
ABO/RH(D): O POS
Antibody Screen: NEGATIVE

## 2014-12-03 LAB — SURGICAL PCR SCREEN
MRSA, PCR: NEGATIVE
Staphylococcus aureus: NEGATIVE

## 2014-12-03 LAB — APTT: aPTT: 47 seconds — ABNORMAL HIGH (ref 24–37)

## 2014-12-03 MED ORDER — CHLORHEXIDINE GLUCONATE 4 % EX LIQD: 60.0000 mL | Freq: Once | CUTANEOUS | Status: DC

## 2014-12-04 NOTE — Progress Notes (Addendum)
Anesthesia Chart Review:  Pt is 81 year old female scheduled for R total hip arthroplasty on 12/16/2014 with Dr. Rhona Raider.   Cardiologist Dr. Marlou Porch. EP cardiologist Dr. Lovena Le.   PMH includes: HTN, atrial fibrillation, sinoatrial node dysfunction, pacemaker (medtronic), aortic regurgitation (mild-moderate), mitral regurgitation, hyperlipidemia, hypothyroidism  Medications include: Coumadin. Pt is to stop coumadin 12/11/2014.   Preoperative labs reviewed.  PTT 27. PT/INR 32.3/3.11. Will recheck DOS.   Chest x-ray reviewed:  -No evidence of lobar pneumonia, however, there is increased reticular nodular opacity of the bilateral lungs compared to the prior plain film. This pattern is nonspecific, and includes a differential diagnosis of infectious/inflammatory process, as well as more indolent process such as a lymphangitic carcinoma. Correlation with the patient's prior history, including that of potential prior carcinoma recommended.  -Rounded nodule in the right suprahilar region as well as at the right base, as above. Further evaluation with noncontrast CT is recommended.  Called Juliann Pulse in Dr. Jerald Kief office about cxr results. They notified pt's PCP Dr. Lavone Orn and he is pursuing f/u on cxr findings.   Echo 03/17/2008: - Overall left ventricular systolic function was mildly decreased.Left ventricular ejection fraction was estimated , range being 45 % to 50 %. There was moderate hypokinesis of the mid-distal anteroseptal wall. - The aortic valve was mildly calcified. There was mild to moderate aortic valvular regurgitation. - There was mild mitral valvular regurgitation. - The left atrium was moderately dilated. - The right atrium was mildly dilated.  Pt has cardiac clearance for surgery from Dr. Marlou Porch in [place] note dated 09/17/2014.   If no changes, I anticipate pt can proceed with surgery as scheduled.   Willeen Cass, FNP-BC [facility] Short Stay Surgical  Center/Anesthesiology Phone: [phone] 12/04/2014 2:51 PM

## 2014-12-09 ENCOUNTER — Ambulatory Visit (INDEPENDENT_AMBULATORY_CARE_PROVIDER_SITE_OTHER): Payer: Medicare Other | Admitting: Pharmacist Clinician (PhC)/ Clinical Pharmacy Specialist

## 2014-12-09 ENCOUNTER — Encounter: Payer: Self-pay | Admitting: Internal Medicine

## 2014-12-09 ENCOUNTER — Ambulatory Visit (INDEPENDENT_AMBULATORY_CARE_PROVIDER_SITE_OTHER): Payer: Medicare Other | Admitting: Internal Medicine

## 2014-12-09 VITALS — BP 110/60 | HR 85 | Ht 67.0 in | Wt 150.4 lb

## 2014-12-09 DIAGNOSIS — Z5181 Encounter for therapeutic drug level monitoring: Secondary | ICD-10-CM

## 2014-12-09 DIAGNOSIS — Z7901 Long term (current) use of anticoagulants: Secondary | ICD-10-CM

## 2014-12-09 DIAGNOSIS — I10 Essential (primary) hypertension: Secondary | ICD-10-CM | POA: Diagnosis not present

## 2014-12-09 DIAGNOSIS — Z950 Presence of cardiac pacemaker: Secondary | ICD-10-CM | POA: Diagnosis not present

## 2014-12-09 DIAGNOSIS — I4891 Unspecified atrial fibrillation: Secondary | ICD-10-CM

## 2014-12-09 DIAGNOSIS — I480 Paroxysmal atrial fibrillation: Secondary | ICD-10-CM | POA: Diagnosis not present

## 2014-12-09 LAB — MDC_IDC_ENUM_SESS_TYPE_INCLINIC
Battery Impedance: 1167 Ohm
Battery Remaining Longevity: 59 mo
Battery Voltage: 2.77 V
Brady Statistic RV Percent Paced: 17 %
Date Time Interrogation Session: 20160209155547
Lead Channel Impedance Value: 629 Ohm
Lead Channel Impedance Value: 67 Ohm
Lead Channel Pacing Threshold Amplitude: 1.5 V
Lead Channel Pacing Threshold Pulse Width: 0.4 ms
Lead Channel Sensing Intrinsic Amplitude: 15.67 mV
Lead Channel Setting Pacing Amplitude: 2 V
Lead Channel Setting Pacing Pulse Width: 0.4 ms
Lead Channel Setting Sensing Sensitivity: 5.6 mV

## 2014-12-09 LAB — POCT INR: INR: 2.2

## 2014-12-09 NOTE — Progress Notes (Signed)
HPI Patricia Holt returns today for ongoing evaluation and management of her PPM and now chronic atrial fibrillation. She is a very pleasant 81 yo woman who developed symptomatic bradycardia and underwent PPM in 2010. She initially developed atrial fibrillation in 2009. She has now had chronic atrial fibrillation and been treated with a strategy of rate control. Despite her advanced age, she has remained active. She has known mild aortic stenosis. No syncope. No edema.  No Known Allergies   Current Outpatient Prescriptions  Medication Sig Dispense Refill  . acetaminophen (TYLENOL) 500 MG tablet Take 500 mg by mouth every 8 (eight) hours as needed (pain).    . CALCIUM PO Take 500 mg by mouth 2 (two) times daily.    . Cholecalciferol (VITAMIN D PO) Take 1 tablet by mouth daily.    Marland Kitchen Dextromethorphan-Guaifenesin 10-200 MG CAPS Take 1 tablet by mouth 2 (two) times daily as needed (cold symptoms).    . docusate sodium (COLACE) 50 MG capsule Take by mouth daily as needed (constipation).     . fluticasone (FLONASE) 50 MCG/ACT nasal spray Place 2 sprays into the nose daily as needed for allergies (nasal drip).     . furosemide (LASIX) 40 MG tablet Take 20 mg by mouth as needed for fluid.     Marland Kitchen levothyroxine (SYNTHROID, LEVOTHROID) 25 MCG tablet Take 25 mcg by mouth daily before breakfast.   3  . losartan (COZAAR) 25 MG tablet TAKE 1 TABLET BY MOUTH DAILY 30 tablet 5  . Menthol, Topical Analgesic, (BENGAY EX) Apply 1 application topically 2 (two) times daily as needed (leg pain).    . metoprolol succinate (TOPROL-XL) 100 MG 24 hr tablet TAKE 1 TABLET BY MOUTH EVERY DAY WITH OR IMMEDIATELY FOLLOWING A MEAL 30 tablet 6  . Multiple Vitamins-Minerals (ICAPS PO) Take 1 capsule by mouth 2 (two) times daily.     Marland Kitchen OVER THE COUNTER MEDICATION Take by mouth daily as needed (nasal drip). Tylenol Cold - Mucous Severe (dextromethorphan, acetaminophen, phenylephrine, guaifenesin) - takes early morning if  she wakes up with a nasal drip (1 swig)    . warfarin (COUMADIN) 5 MG tablet Take as directed by anticoagulation clinic (Patient taking differently: Take 2.5-5 mg by mouth one time only at 6 PM. Take 1/2 tablet (2.5 mg) on Saturdays, take 1 tablet (5 mg) on Sunday thru Friday) 40 tablet 3   No current facility-administered medications for this visit.     Past Medical History  Diagnosis Date  . HTN (hypertension)   . Hyperlipidemia   . Hypothyroidism   . Sinoatrial node dysfunction   . Aortic valve disorder   . Osteoporosis     , severe, hypercalciuria, Boniva since 2005, T10 compression fracture 2009  . Goiter   . Polymyalgia rheumatica   . Carpal tunnel syndrome   . Other disorder of calcium metabolism   . Macular degeneration   . DJD (degenerative joint disease)   . Atrial fibrillation     paroxysmal, status post successful vessel cardioversion 5/09, skains,transient left ventricular systolic dysfunction, likely secondary to cardioversion/stunning of the myocardium, no evidence significant CAD, echo 7/09 shows normal ejection fraction  . Mitral regurgitation   . Chronic anticoagulation   . Presence of permanent cardiac pacemaker   . Dysrhythmia   . Sinus drainage     in the morning, clear drainage  . GERD (gastroesophageal reflux disease)     OTC occasionally    ROS:   All systems  reviewed and negative except as noted in the HPI.   Past Surgical History  Procedure Laterality Date  . Pacemaker insertion      Dual chamber permanent pacer insertion for tachy brady syndrome, 01/2009  . Femur fracture surgery    . Dilation and curettage of uterus    . Tonsillectomy and adenoidectomy    . Carpal tunnel release    . Total knee arthroplasty    . Eye surgery Bilateral     cataracts with lens      Family History  Problem Relation Age of Onset  . Cancer Sister   . Diabetes Sister   . Hypertension Mother   . Hypertension Father      History   Social History  .  Marital Status: Widowed    Spouse Name: N/A    Number of Children: N/A  . Years of Education: N/A   Occupational History  . Not on file.   Social History Main Topics  . Smoking status: Never Smoker   . Smokeless tobacco: Never Used  . Alcohol Use: Yes     Comment: wine and beer occasionally  . Drug Use: No  . Sexual Activity: Not on file   Other Topics Concern  . Not on file   Social History Narrative     BP 110/60 mmHg  Pulse 85  Ht 5\' 7"  (1.702 m)  Wt 150 lb 6.4 oz (68.221 kg)  BMI 23.55 kg/m2  SpO2 98%  Physical Exam:  Well appearing elderly woman, NAD HEENT: Unremarkable Neck:  No JVD, no thyromegally Back:  No CVA tenderness Lungs:  Clear with no wheezes, rales, or rhonchi HEART:  IRegular rate rhythm, grade 1/6 systolic murmur, no rubs, no clicks Abd:  soft, positive bowel sounds, no organomegally, no rebound, no guarding Ext:  2 plus pulses, no edema, no cyanosis, no clubbing Skin:  No rashes no nodules Neuro:  CN II through XII intact, motor grossly intact    DEVICE  Normal device function.  See PaceArt for details.   Assess/Plan:

## 2014-12-09 NOTE — Assessment & Plan Note (Signed)
Her Medtronic single-chamber pacemaker is working normally. We'll plan to recheck in several months. 

## 2014-12-09 NOTE — Patient Instructions (Signed)
Your physician wants you to follow-up in: 1 year with Dr. Lovena Le. You will receive a reminder letter in the mail two months in advance. If you don't receive a letter, please call our office to schedule the follow-up appointment.  Remote monitoring is used to monitor your Pacemaker or ICD from home. This monitoring reduces the number of office visits required to check your device to one time per year. It allows [place] to keep an eye on the functioning of your device to ensure it is working properly. You are scheduled for a device check from home on 03/10/2015. You may send your transmission at any time that day. If you have a wireless device, the transmission will be sent automatically. After your physician reviews your transmission, you will receive a postcard with your next transmission date.  Your physician recommends that you continue on your current medications as directed. Please refer to the Current Medication list given to you today.

## 2014-12-09 NOTE — Assessment & Plan Note (Signed)
Her chronic atrial fibrillation is well-controlled, and she is pacing approximately 20% of the time in the ventricle. No change in medical therapy.

## 2014-12-09 NOTE — Assessment & Plan Note (Signed)
Her blood pressure is well controlled. She will continue her current medical therapy.

## 2014-12-11 DIAGNOSIS — R938 Abnormal findings on diagnostic imaging of other specified body structures: Secondary | ICD-10-CM | POA: Diagnosis not present

## 2014-12-15 MED ORDER — LACTATED RINGERS IV SOLN
INTRAVENOUS | Status: DC
  Administered 2014-12-16: 08:00:00 via INTRAVENOUS

## 2014-12-15 MED ORDER — CEFAZOLIN SODIUM-DEXTROSE 2-3 GM-% IV SOLR
2.0000 g | INTRAVENOUS | Status: AC
  Administered 2014-12-16: 2 g via INTRAVENOUS
  Filled 2014-12-15: qty 50

## 2014-12-15 NOTE — Anesthesia Preprocedure Evaluation (Addendum)
Anesthesia Evaluation  Patient identified by MRN, date of birth, ID band Patient awake    Reviewed: Allergy & Precautions, NPO status , Patient's Chart, lab work & pertinent test results, reviewed documented beta blocker date and time   History of Anesthesia Complications Negative for: history of anesthetic complications  Airway Mallampati: III  TM Distance: >3 FB Neck ROM: Full    Dental no notable dental hx. (+) Dental Advisory Given, Edentulous Upper, Edentulous Lower, Lower Dentures, Upper Dentures   Pulmonary neg pulmonary ROS,  breath sounds clear to auscultation  Pulmonary exam normal       Cardiovascular Exercise Tolerance: Good hypertension, Pt. on medications and Pt. on home beta blockers + dysrhythmias Atrial Fibrillation + pacemaker + Valvular Problems/Murmurs AI and AS Rhythm:Regular Rate:Normal     Neuro/Psych negative neurological ROS  negative psych ROS   GI/Hepatic Neg liver ROS, GERD-  Medicated and Controlled,  Endo/Other  Hypothyroidism   Renal/GU negative Renal ROS  negative genitourinary   Musculoskeletal  (+) Arthritis -, Osteoarthritis,    Abdominal   Peds negative pediatric ROS (+)  Hematology negative hematology ROS (+)   Anesthesia Other Findings   Reproductive/Obstetrics negative OB ROS                            Anesthesia Physical Anesthesia Plan  ASA: III  Anesthesia Plan: General   Post-op Pain Management:    Induction: Intravenous  Airway Management Planned: Oral ETT  Additional Equipment:   Intra-op Plan:   Post-operative Plan: Extubation in OR  Informed Consent: I have reviewed the patients History and Physical, chart, labs and discussed the procedure including the risks, benefits and alternatives for the proposed anesthesia with the patient or authorized representative who has indicated his/her understanding and acceptance.   Dental  advisory given  Plan Discussed with: CRNA  Anesthesia Plan Comments:         Anesthesia Quick Evaluation

## 2014-12-15 NOTE — H&P (Signed)
TOTAL HIP ADMISSION H&P  Patient is admitted for right total hip arthroplasty.  Subjective:  Chief Complaint: right hip pain  HPI: Patricia Holt, 81 y.o. female, has a history of pain and functional disability in the right hip(s) due to arthritis and patient has failed non-surgical conservative treatments for greater than 12 weeks to include NSAID's and/or analgesics, corticosteriod injections, use of assistive devices and activity modification.  Onset of symptoms was gradual starting 6 years ago with gradually worsening course since that time.The patient noted no past surgery on the right hip(s).  Patient currently rates pain in the right hip at 10 out of 10 with activity. Patient has night pain, worsening of pain with activity and weight bearing, trendelenberg gait and joint swelling. Patient has evidence of subchondral cysts, periarticular osteophytes and joint space narrowing by imaging studies. This condition presents safety issues increasing the risk of falls. This patient has had no.  There is no current active infection.  Patient Active Problem List   Diagnosis Date Noted  . Encounter for therapeutic drug monitoring 12/04/2013  . Pacemaker 12/04/2013  . Long term current use of anticoagulant therapy 08/21/2013  . Atrial fibrillation   . HTN (hypertension)   . Aortic valve disorder   . Chronic anticoagulation    Past Medical History  Diagnosis Date  . HTN (hypertension)   . Hyperlipidemia   . Hypothyroidism   . Sinoatrial node dysfunction   . Aortic valve disorder   . Osteoporosis     , severe, hypercalciuria, Boniva since 2005, T10 compression fracture 2009  . Goiter   . Polymyalgia rheumatica   . Carpal tunnel syndrome   . Other disorder of calcium metabolism   . Macular degeneration   . DJD (degenerative joint disease)   . Atrial fibrillation     paroxysmal, status post successful vessel cardioversion 5/09, skains,transient left ventricular systolic dysfunction,  likely secondary to cardioversion/stunning of the myocardium, no evidence significant CAD, echo 7/09 shows normal ejection fraction  . Mitral regurgitation   . Chronic anticoagulation   . Presence of permanent cardiac pacemaker   . Dysrhythmia   . Sinus drainage     in the morning, clear drainage  . GERD (gastroesophageal reflux disease)     OTC occasionally    Past Surgical History  Procedure Laterality Date  . Pacemaker insertion      Dual chamber permanent pacer insertion for tachy brady syndrome, 01/2009  . Femur fracture surgery    . Dilation and curettage of uterus    . Tonsillectomy and adenoidectomy    . Carpal tunnel release    . Total knee arthroplasty    . Eye surgery Bilateral     cataracts with lens     No prescriptions prior to admission   No Known Allergies  History  Substance Use Topics  . Smoking status: Never Smoker   . Smokeless tobacco: Never Used  . Alcohol Use: Yes     Comment: wine and beer occasionally    Family History  Problem Relation Age of Onset  . Cancer Sister   . Diabetes Sister   . Hypertension Mother   . Hypertension Father      Review of Systems  HENT: Negative.   Eyes: Negative.   Respiratory: Negative.   Cardiovascular: Negative.   Gastrointestinal: Negative.   Genitourinary: Negative.   Musculoskeletal: Positive for joint pain.  Skin: Negative.   Neurological: Negative.   Endo/Heme/Allergies: Negative.   Psychiatric/Behavioral: Negative.  Objective:  Physical Exam  Constitutional: She appears well-nourished.  HENT:  Head: Normocephalic.  Eyes: Pupils are equal, round, and reactive to light.  Neck: Normal range of motion.  Cardiovascular: Normal rate.   Respiratory: Effort normal.  GI: Soft.  Musculoskeletal:  Right hip motion is limited and very painful.  Severe pain with rotation right side.  Leg lengths equal.  Sensory motor function normal  Neurological: She is alert.  Skin: Skin is warm.    Vital signs  in last 24 hours:    Labs:   Estimated body mass index is 23.29 kg/(m^2) as calculated from the following:   Height as of 09/17/14: 5' 7.5" (1.715 m).   Weight as of 09/17/14: 68.493 kg (151 lb).   Imaging Review Plain radiographs demonstrate severe degenerative joint disease of the right hip(s). The bone quality appears to be good for age and reported activity level.  Assessment/Plan:  End stage arthritis, right hip(s)Primary bone-on-bone end-stage DJD right hip  The patient history, physical examination, clinical judgement of the provider and imaging studies are consistent with end stage degenerative joint disease of the right hip(s) and total hip arthroplasty is deemed medically necessary. The treatment options including medical management, injection therapy, arthroscopy and arthroplasty were discussed at length. The risks and benefits of total hip arthroplasty were presented and reviewed. The risks due to aseptic loosening, infection, stiffness, dislocation/subluxation,  thromboembolic complications and other imponderables were discussed.  The patient acknowledged the explanation, agreed to proceed with the plan and consent was signed. Patient is being admitted for inpatient treatment for surgery, pain control, PT, OT, prophylactic antibiotics, VTE prophylaxis, progressive ambulation and ADL's and discharge planning.The patient is planning to be discharged to skilled nursing facilityPostoperatively she would be started on Lovenox and then Coumadin

## 2014-12-16 ENCOUNTER — Inpatient Hospital Stay (HOSPITAL_COMMUNITY): Payer: Medicare Other | Admitting: Anesthesiology

## 2014-12-16 ENCOUNTER — Inpatient Hospital Stay (HOSPITAL_COMMUNITY): Payer: Medicare Other

## 2014-12-16 ENCOUNTER — Inpatient Hospital Stay (HOSPITAL_COMMUNITY)
Admission: RE | Admit: 2014-12-16 | Discharge: 2014-12-19 | DRG: 470 | Disposition: A | Discharge status: Skilled Nursing Facility | Payer: Medicare Other | Source: Ambulatory Visit | Attending: Orthopaedic Surgery | Admitting: Orthopaedic Surgery

## 2014-12-16 ENCOUNTER — Inpatient Hospital Stay (HOSPITAL_COMMUNITY): Payer: Medicare Other | Admitting: Emergency Medicine

## 2014-12-16 ENCOUNTER — Encounter (HOSPITAL_COMMUNITY): Payer: Self-pay | Admitting: Anesthesiology

## 2014-12-16 ENCOUNTER — Encounter (HOSPITAL_COMMUNITY)
Admission: RE | Disposition: A | Discharge status: Skilled Nursing Facility | Payer: Self-pay | Source: Ambulatory Visit | Attending: Orthopaedic Surgery

## 2014-12-16 DIAGNOSIS — Z7901 Long term (current) use of anticoagulants: Secondary | ICD-10-CM

## 2014-12-16 DIAGNOSIS — Z833 Family history of diabetes mellitus: Secondary | ICD-10-CM

## 2014-12-16 DIAGNOSIS — M6281 Muscle weakness (generalized): Secondary | ICD-10-CM | POA: Diagnosis not present

## 2014-12-16 DIAGNOSIS — M25551 Pain in right hip: Secondary | ICD-10-CM | POA: Diagnosis not present

## 2014-12-16 DIAGNOSIS — K219 Gastro-esophageal reflux disease without esophagitis: Secondary | ICD-10-CM | POA: Diagnosis present

## 2014-12-16 DIAGNOSIS — Z96641 Presence of right artificial hip joint: Secondary | ICD-10-CM | POA: Diagnosis not present

## 2014-12-16 DIAGNOSIS — R278 Other lack of coordination: Secondary | ICD-10-CM | POA: Diagnosis not present

## 2014-12-16 DIAGNOSIS — I4891 Unspecified atrial fibrillation: Secondary | ICD-10-CM | POA: Diagnosis present

## 2014-12-16 DIAGNOSIS — M1611 Unilateral primary osteoarthritis, right hip: Secondary | ICD-10-CM | POA: Diagnosis present

## 2014-12-16 DIAGNOSIS — M353 Polymyalgia rheumatica: Secondary | ICD-10-CM | POA: Diagnosis present

## 2014-12-16 DIAGNOSIS — Z8249 Family history of ischemic heart disease and other diseases of the circulatory system: Secondary | ICD-10-CM

## 2014-12-16 DIAGNOSIS — I10 Essential (primary) hypertension: Secondary | ICD-10-CM | POA: Diagnosis present

## 2014-12-16 DIAGNOSIS — Z79899 Other long term (current) drug therapy: Secondary | ICD-10-CM

## 2014-12-16 DIAGNOSIS — Z96659 Presence of unspecified artificial knee joint: Secondary | ICD-10-CM | POA: Diagnosis present

## 2014-12-16 DIAGNOSIS — G5600 Carpal tunnel syndrome, unspecified upper limb: Secondary | ICD-10-CM | POA: Diagnosis not present

## 2014-12-16 DIAGNOSIS — R32 Unspecified urinary incontinence: Secondary | ICD-10-CM | POA: Diagnosis present

## 2014-12-16 DIAGNOSIS — M169 Osteoarthritis of hip, unspecified: Secondary | ICD-10-CM | POA: Diagnosis not present

## 2014-12-16 DIAGNOSIS — M810 Age-related osteoporosis without current pathological fracture: Secondary | ICD-10-CM | POA: Diagnosis present

## 2014-12-16 DIAGNOSIS — E039 Hypothyroidism, unspecified: Secondary | ICD-10-CM | POA: Diagnosis present

## 2014-12-16 DIAGNOSIS — Z950 Presence of cardiac pacemaker: Secondary | ICD-10-CM

## 2014-12-16 DIAGNOSIS — E785 Hyperlipidemia, unspecified: Secondary | ICD-10-CM | POA: Diagnosis present

## 2014-12-16 DIAGNOSIS — M79609 Pain in unspecified limb: Secondary | ICD-10-CM | POA: Diagnosis not present

## 2014-12-16 DIAGNOSIS — H3530 Unspecified macular degeneration: Secondary | ICD-10-CM | POA: Diagnosis present

## 2014-12-16 DIAGNOSIS — M8088XS Other osteoporosis with current pathological fracture, vertebra(e), sequela: Secondary | ICD-10-CM | POA: Diagnosis not present

## 2014-12-16 DIAGNOSIS — Z419 Encounter for procedure for purposes other than remedying health state, unspecified: Secondary | ICD-10-CM

## 2014-12-16 DIAGNOSIS — K5900 Constipation, unspecified: Secondary | ICD-10-CM | POA: Diagnosis not present

## 2014-12-16 DIAGNOSIS — Z471 Aftercare following joint replacement surgery: Secondary | ICD-10-CM | POA: Diagnosis not present

## 2014-12-16 DIAGNOSIS — R2681 Unsteadiness on feet: Secondary | ICD-10-CM | POA: Diagnosis not present

## 2014-12-16 HISTORY — PX: TOTAL HIP ARTHROPLASTY: SHX124

## 2014-12-16 LAB — CBC
HCT: 33.9 % — ABNORMAL LOW (ref 36.0–46.0)
Hemoglobin: 10.9 g/dL — ABNORMAL LOW (ref 12.0–15.0)
MCH: 29.1 pg (ref 26.0–34.0)
MCHC: 32.2 g/dL (ref 30.0–36.0)
MCV: 90.4 fL (ref 78.0–100.0)
Platelets: 317 10*3/uL (ref 150–400)
RBC: 3.75 MIL/uL — ABNORMAL LOW (ref 3.87–5.11)
RDW: 14.4 % (ref 11.5–15.5)
WBC: 9.3 10*3/uL (ref 4.0–10.5)

## 2014-12-16 LAB — PROTIME-INR
INR: 1.17 (ref 0.00–1.49)
Prothrombin Time: 15.1 seconds (ref 11.6–15.2)

## 2014-12-16 LAB — CREATININE, SERUM
Creatinine, Ser: 0.71 mg/dL (ref 0.50–1.10)
GFR calc Af Amer: 86 mL/min — ABNORMAL LOW (ref >90)
GFR calc non Af Amer: 74 mL/min — ABNORMAL LOW (ref >90)

## 2014-12-16 LAB — APTT: aPTT: 27 seconds (ref 24–37)

## 2014-12-16 SURGERY — ARTHROPLASTY, HIP, TOTAL, ANTERIOR APPROACH
Anesthesia: General | Site: Hip | Laterality: Right

## 2014-12-16 MED ORDER — ROCURONIUM BROMIDE 50 MG/5ML IV SOLN
INTRAVENOUS | Status: AC
  Filled 2014-12-16: qty 1

## 2014-12-16 MED ORDER — DIPHENHYDRAMINE HCL 12.5 MG/5ML PO ELIX: 12.5000 mg | ORAL_SOLUTION | ORAL | Status: DC | PRN

## 2014-12-16 MED ORDER — STERILE WATER FOR INJECTION IJ SOLN
INTRAMUSCULAR | Status: AC
  Filled 2014-12-16: qty 10

## 2014-12-16 MED ORDER — PHENOL 1.4 % MT LIQD: 1.0000 | OROMUCOSAL | Status: DC | PRN

## 2014-12-16 MED ORDER — EPHEDRINE SULFATE 50 MG/ML IJ SOLN
INTRAMUSCULAR | Status: AC
  Filled 2014-12-16: qty 1

## 2014-12-16 MED ORDER — ONDANSETRON HCL 4 MG/2ML IJ SOLN: 4.0000 mg | Freq: Four times a day (QID) | INTRAMUSCULAR | Status: DC | PRN

## 2014-12-16 MED ORDER — ACETAMINOPHEN 325 MG PO TABS: 650.0000 mg | ORAL_TABLET | Freq: Four times a day (QID) | ORAL | Status: DC | PRN

## 2014-12-16 MED ORDER — 0.9 % SODIUM CHLORIDE (POUR BTL) OPTIME
TOPICAL | Status: DC | PRN
  Administered 2014-12-16: 1000 mL

## 2014-12-16 MED ORDER — HYDROMORPHONE HCL 1 MG/ML IJ SOLN
0.5000 mg | INTRAMUSCULAR | Status: DC | PRN
  Administered 2014-12-16: 0.5 mg via INTRAVENOUS
  Filled 2014-12-16: qty 1

## 2014-12-16 MED ORDER — WARFARIN - PHARMACIST DOSING INPATIENT: Freq: Every day | Status: DC

## 2014-12-16 MED ORDER — VECURONIUM BROMIDE 10 MG IV SOLR
INTRAVENOUS | Status: DC | PRN
  Administered 2014-12-16: 1 mg via INTRAVENOUS
  Administered 2014-12-16: 2 mg via INTRAVENOUS

## 2014-12-16 MED ORDER — ACETAMINOPHEN 650 MG RE SUPP
650.0000 mg | Freq: Four times a day (QID) | RECTAL | Status: DC | PRN
Start: 2014-12-16 — End: 2014-12-19

## 2014-12-16 MED ORDER — ENOXAPARIN SODIUM 40 MG/0.4ML ~~LOC~~ SOLN
40.0000 mg | SUBCUTANEOUS | Status: DC
  Administered 2014-12-17 – 2014-12-18 (×2): 40 mg via SUBCUTANEOUS
  Filled 2014-12-16 (×2): qty 0.4

## 2014-12-16 MED ORDER — METHOCARBAMOL 500 MG PO TABS
500.0000 mg | ORAL_TABLET | Freq: Four times a day (QID) | ORAL | Status: DC | PRN
  Administered 2014-12-16 – 2014-12-19 (×6): 500 mg via ORAL
  Filled 2014-12-16 (×7): qty 1

## 2014-12-16 MED ORDER — BUPIVACAINE-EPINEPHRINE 0.5% -1:200000 IJ SOLN
INTRAMUSCULAR | Status: DC | PRN
  Administered 2014-12-16: 20 mL

## 2014-12-16 MED ORDER — SODIUM CHLORIDE 0.9 % IJ SOLN
INTRAMUSCULAR | Status: AC
  Filled 2014-12-16: qty 10

## 2014-12-16 MED ORDER — ONDANSETRON HCL 4 MG/2ML IJ SOLN
INTRAMUSCULAR | Status: DC | PRN
  Administered 2014-12-16: 4 mg via INTRAVENOUS

## 2014-12-16 MED ORDER — METHOCARBAMOL 500 MG PO TABS
ORAL_TABLET | ORAL | Status: AC
  Filled 2014-12-16: qty 1

## 2014-12-16 MED ORDER — LACTATED RINGERS IV SOLN
INTRAVENOUS | Status: DC | PRN
  Administered 2014-12-16 (×2): via INTRAVENOUS

## 2014-12-16 MED ORDER — VECURONIUM BROMIDE 10 MG IV SOLR
INTRAVENOUS | Status: AC
  Filled 2014-12-16: qty 10

## 2014-12-16 MED ORDER — FLUTICASONE PROPIONATE 50 MCG/ACT NA SUSP: 2.0000 | Freq: Every day | NASAL | Status: DC | PRN

## 2014-12-16 MED ORDER — GLYCOPYRROLATE 0.2 MG/ML IJ SOLN
INTRAMUSCULAR | Status: AC
  Filled 2014-12-16: qty 1

## 2014-12-16 MED ORDER — WARFARIN SODIUM 7.5 MG PO TABS
7.5000 mg | ORAL_TABLET | Freq: Once | ORAL | Status: AC
  Administered 2014-12-16: 7.5 mg via ORAL
  Filled 2014-12-16: qty 1

## 2014-12-16 MED ORDER — LIDOCAINE HCL (CARDIAC) 20 MG/ML IV SOLN
INTRAVENOUS | Status: AC
  Filled 2014-12-16: qty 5

## 2014-12-16 MED ORDER — HYDROMORPHONE HCL 1 MG/ML IJ SOLN
INTRAMUSCULAR | Status: AC
  Filled 2014-12-16: qty 1

## 2014-12-16 MED ORDER — ONDANSETRON HCL 4 MG/2ML IJ SOLN: 4.0000 mg | Freq: Once | INTRAMUSCULAR | Status: DC | PRN

## 2014-12-16 MED ORDER — NEOSTIGMINE METHYLSULFATE 10 MG/10ML IV SOLN
INTRAVENOUS | Status: DC | PRN
  Administered 2014-12-16: 4 mg via INTRAVENOUS

## 2014-12-16 MED ORDER — PHENYLEPHRINE 40 MCG/ML (10ML) SYRINGE FOR IV PUSH (FOR BLOOD PRESSURE SUPPORT)
PREFILLED_SYRINGE | INTRAVENOUS | Status: AC
  Filled 2014-12-16: qty 10

## 2014-12-16 MED ORDER — METHOCARBAMOL 1000 MG/10ML IJ SOLN
500.0000 mg | Freq: Four times a day (QID) | INTRAVENOUS | Status: DC | PRN
  Filled 2014-12-16: qty 5

## 2014-12-16 MED ORDER — FENTANYL CITRATE 0.05 MG/ML IJ SOLN
INTRAMUSCULAR | Status: AC
  Filled 2014-12-16: qty 5

## 2014-12-16 MED ORDER — FENTANYL CITRATE 0.05 MG/ML IJ SOLN
INTRAMUSCULAR | Status: DC | PRN
  Administered 2014-12-16 (×4): 50 ug via INTRAVENOUS

## 2014-12-16 MED ORDER — METOPROLOL SUCCINATE ER 100 MG PO TB24
100.0000 mg | ORAL_TABLET | Freq: Every day | ORAL | Status: DC
  Administered 2014-12-17 – 2014-12-19 (×3): 100 mg via ORAL
  Filled 2014-12-16 (×3): qty 1

## 2014-12-16 MED ORDER — GLYCOPYRROLATE 0.2 MG/ML IJ SOLN
INTRAMUSCULAR | Status: DC | PRN
  Administered 2014-12-16: .5 mg via INTRAVENOUS

## 2014-12-16 MED ORDER — LACTATED RINGERS IV SOLN: INTRAVENOUS | Status: DC

## 2014-12-16 MED ORDER — ONDANSETRON HCL 4 MG PO TABS: 4.0000 mg | ORAL_TABLET | Freq: Four times a day (QID) | ORAL | Status: DC | PRN

## 2014-12-16 MED ORDER — LEVOTHYROXINE SODIUM 25 MCG PO TABS
25.0000 ug | ORAL_TABLET | Freq: Every day | ORAL | Status: DC
  Administered 2014-12-17 – 2014-12-19 (×3): 25 ug via ORAL
  Filled 2014-12-16 (×4): qty 1

## 2014-12-16 MED ORDER — METOCLOPRAMIDE HCL 10 MG PO TABS: 5.0000 mg | ORAL_TABLET | Freq: Three times a day (TID) | ORAL | Status: DC | PRN

## 2014-12-16 MED ORDER — LIDOCAINE HCL (CARDIAC) 20 MG/ML IV SOLN
INTRAVENOUS | Status: DC | PRN
  Administered 2014-12-16: 100 mg via INTRAVENOUS

## 2014-12-16 MED ORDER — LOSARTAN POTASSIUM 25 MG PO TABS
25.0000 mg | ORAL_TABLET | Freq: Every day | ORAL | Status: DC
  Administered 2014-12-16 – 2014-12-19 (×4): 25 mg via ORAL
  Filled 2014-12-16 (×4): qty 1

## 2014-12-16 MED ORDER — ALUM & MAG HYDROXIDE-SIMETH 200-200-20 MG/5ML PO SUSP: 30.0000 mL | ORAL | Status: DC | PRN

## 2014-12-16 MED ORDER — HYDROCODONE-ACETAMINOPHEN 5-325 MG PO TABS
1.0000 | ORAL_TABLET | ORAL | Status: DC | PRN
  Administered 2014-12-16 – 2014-12-17 (×5): 2 via ORAL
  Administered 2014-12-18 (×2): 1 via ORAL
  Filled 2014-12-16 (×5): qty 2
  Filled 2014-12-16: qty 1
  Filled 2014-12-16 (×3): qty 2

## 2014-12-16 MED ORDER — FENTANYL CITRATE 0.05 MG/ML IJ SOLN
INTRAMUSCULAR | Status: AC
  Filled 2014-12-16: qty 2

## 2014-12-16 MED ORDER — GLYCOPYRROLATE 0.2 MG/ML IJ SOLN
INTRAMUSCULAR | Status: AC
  Filled 2014-12-16: qty 3

## 2014-12-16 MED ORDER — BUPIVACAINE-EPINEPHRINE (PF) 0.5% -1:200000 IJ SOLN
INTRAMUSCULAR | Status: AC
  Filled 2014-12-16: qty 30

## 2014-12-16 MED ORDER — BISACODYL 5 MG PO TBEC
5.0000 mg | DELAYED_RELEASE_TABLET | Freq: Every day | ORAL | Status: DC | PRN
  Administered 2014-12-18 – 2014-12-19 (×3): 5 mg via ORAL
  Filled 2014-12-16 (×3): qty 1

## 2014-12-16 MED ORDER — SUCCINYLCHOLINE CHLORIDE 20 MG/ML IJ SOLN
INTRAMUSCULAR | Status: AC
  Filled 2014-12-16: qty 1

## 2014-12-16 MED ORDER — ROCURONIUM BROMIDE 100 MG/10ML IV SOLN
INTRAVENOUS | Status: DC | PRN
  Administered 2014-12-16: 40 mg via INTRAVENOUS

## 2014-12-16 MED ORDER — PHENYLEPHRINE HCL 10 MG/ML IJ SOLN
INTRAMUSCULAR | Status: DC | PRN
  Administered 2014-12-16 (×7): 40 ug via INTRAVENOUS

## 2014-12-16 MED ORDER — MENTHOL 3 MG MT LOZG: 1.0000 | LOZENGE | OROMUCOSAL | Status: DC | PRN

## 2014-12-16 MED ORDER — PROPOFOL 10 MG/ML IV BOLUS
INTRAVENOUS | Status: DC | PRN
  Administered 2014-12-16: 100 mg via INTRAVENOUS
  Administered 2014-12-16: 20 mg via INTRAVENOUS

## 2014-12-16 MED ORDER — FUROSEMIDE 20 MG PO TABS: 20.0000 mg | ORAL_TABLET | ORAL | Status: DC | PRN

## 2014-12-16 MED ORDER — ONDANSETRON HCL 4 MG/2ML IJ SOLN
INTRAMUSCULAR | Status: AC
  Filled 2014-12-16: qty 2

## 2014-12-16 MED ORDER — DOCUSATE SODIUM 100 MG PO CAPS
100.0000 mg | ORAL_CAPSULE | Freq: Two times a day (BID) | ORAL | Status: DC
  Administered 2014-12-16 – 2014-12-19 (×7): 100 mg via ORAL
  Filled 2014-12-16 (×6): qty 1

## 2014-12-16 MED ORDER — FENTANYL CITRATE 0.05 MG/ML IJ SOLN
25.0000 ug | INTRAMUSCULAR | Status: DC | PRN
  Administered 2014-12-16: 50 ug via INTRAVENOUS
  Administered 2014-12-16 (×2): 25 ug via INTRAVENOUS
  Administered 2014-12-16: 50 ug via INTRAVENOUS

## 2014-12-16 MED ORDER — CEFAZOLIN SODIUM-DEXTROSE 2-3 GM-% IV SOLR
2.0000 g | Freq: Four times a day (QID) | INTRAVENOUS | Status: AC
  Administered 2014-12-16 (×2): 2 g via INTRAVENOUS
  Filled 2014-12-16 (×2): qty 50

## 2014-12-16 MED ORDER — PROPOFOL 10 MG/ML IV BOLUS
INTRAVENOUS | Status: AC
  Filled 2014-12-16: qty 20

## 2014-12-16 MED ORDER — METOCLOPRAMIDE HCL 5 MG/ML IJ SOLN: 5.0000 mg | Freq: Three times a day (TID) | INTRAMUSCULAR | Status: DC | PRN

## 2014-12-16 SURGICAL SUPPLY — 56 items
APL SKNCLS STERI-STRIP NONHPOA (GAUZE/BANDAGES/DRESSINGS) ×1
BENZOIN TINCTURE PRP APPL 2/3 (GAUZE/BANDAGES/DRESSINGS) ×1 IMPLANT
BLADE SAW SGTL 18X1.27X75 (BLADE) ×2 IMPLANT
BLADE SURG ROTATE 9660 (MISCELLANEOUS) IMPLANT
CAPT HIP TOTAL 2 ×1 IMPLANT
CELLS DAT CNTRL 66122 CELL SVR (MISCELLANEOUS) ×1 IMPLANT
CLSR STERI-STRIP ANTIMIC 1/2X4 (GAUZE/BANDAGES/DRESSINGS) ×1 IMPLANT
COVER PERINEAL POST (MISCELLANEOUS) ×2 IMPLANT
COVER SURGICAL LIGHT HANDLE (MISCELLANEOUS) ×2 IMPLANT
DRAPE C-ARM 42X72 X-RAY (DRAPES) ×2 IMPLANT
DRAPE IMP U-DRAPE 54X76 (DRAPES) ×2 IMPLANT
DRAPE STERI IOBAN 125X83 (DRAPES) ×2 IMPLANT
DRAPE U-SHAPE 47X51 STRL (DRAPES) ×6 IMPLANT
DRSG AQUACEL AG ADV 3.5X10 (GAUZE/BANDAGES/DRESSINGS) ×2 IMPLANT
DURAPREP 26ML APPLICATOR (WOUND CARE) ×2 IMPLANT
ELECT BLADE 4.0 EZ CLEAN MEGAD (MISCELLANEOUS)
ELECT CAUTERY BLADE 6.4 (BLADE) ×2 IMPLANT
ELECT REM PT RETURN 9FT ADLT (ELECTROSURGICAL) ×2
ELECTRODE BLDE 4.0 EZ CLN MEGD (MISCELLANEOUS) IMPLANT
ELECTRODE REM PT RTRN 9FT ADLT (ELECTROSURGICAL) ×1 IMPLANT
FACESHIELD WRAPAROUND (MASK) ×4 IMPLANT
FACESHIELD WRAPAROUND OR TEAM (MASK) ×2 IMPLANT
GLOVE BIO SURGEON STRL SZ8 (GLOVE) ×10 IMPLANT
GLOVE BIOGEL PI IND STRL 7.5 (GLOVE) IMPLANT
GLOVE BIOGEL PI IND STRL 8 (GLOVE) ×2 IMPLANT
GLOVE BIOGEL PI INDICATOR 7.5 (GLOVE) ×4
GLOVE BIOGEL PI INDICATOR 8 (GLOVE) ×2
GLOVE SURG SS PI 7.5 STRL IVOR (GLOVE) ×5 IMPLANT
GOWN STRL REUS W/ TWL LRG LVL3 (GOWN DISPOSABLE) ×1 IMPLANT
GOWN STRL REUS W/ TWL XL LVL3 (GOWN DISPOSABLE) ×2 IMPLANT
GOWN STRL REUS W/TWL LRG LVL3 (GOWN DISPOSABLE) ×6
GOWN STRL REUS W/TWL XL LVL3 (GOWN DISPOSABLE) ×4
KIT BASIN OR (CUSTOM PROCEDURE TRAY) ×2 IMPLANT
KIT ROOM TURNOVER OR (KITS) ×2 IMPLANT
LINER BOOT UNIVERSAL DISP (MISCELLANEOUS) ×2 IMPLANT
MANIFOLD NEPTUNE II (INSTRUMENTS) ×2 IMPLANT
NS IRRIG 1000ML POUR BTL (IV SOLUTION) ×2 IMPLANT
PACK TOTAL JOINT (CUSTOM PROCEDURE TRAY) ×2 IMPLANT
PACK UNIVERSAL I (CUSTOM PROCEDURE TRAY) ×2 IMPLANT
PAD ARMBOARD 7.5X6 YLW CONV (MISCELLANEOUS) ×4 IMPLANT
PASSER SUT SWANSON 36MM LOOP (INSTRUMENTS) ×1 IMPLANT
RETRACTOR WND ALEXIS 18 MED (MISCELLANEOUS) ×1 IMPLANT
RTRCTR WOUND ALEXIS 18CM MED (MISCELLANEOUS) ×2
STAPLER VISISTAT 35W (STAPLE) ×2 IMPLANT
SUT ETHIBOND NAB CT1 #1 30IN (SUTURE) ×6 IMPLANT
SUT VIC AB 0 CT1 27 (SUTURE) ×2
SUT VIC AB 0 CT1 27XBRD ANBCTR (SUTURE) IMPLANT
SUT VIC AB 1 CT1 27 (SUTURE) ×2
SUT VIC AB 1 CT1 27XBRD ANBCTR (SUTURE) ×1 IMPLANT
SUT VIC AB 2-0 CT1 27 (SUTURE) ×2
SUT VIC AB 2-0 CT1 TAPERPNT 27 (SUTURE) ×1 IMPLANT
SUT VLOC 180 0 24IN GS25 (SUTURE) ×2 IMPLANT
TOWEL OR 17X24 6PK STRL BLUE (TOWEL DISPOSABLE) ×2 IMPLANT
TOWEL OR 17X26 10 PK STRL BLUE (TOWEL DISPOSABLE) ×4 IMPLANT
TRAY FOLEY CATH 14FR (SET/KITS/TRAYS/PACK) IMPLANT
WATER STERILE IRR 1000ML POUR (IV SOLUTION) ×4 IMPLANT

## 2014-12-16 NOTE — Progress Notes (Signed)
ANTICOAGULATION CONSULT NOTE - Initial Consult  Pharmacy Consult for warfarin Indication: atrial fibrillation and VTE prophylaxis  No Known Allergies  Patient Measurements: Height: 5\' 7"  (170.2 cm) Weight: 150 lb (68.04 kg) IBW/kg (Calculated) : 61.6   Vital Signs: Temp: 98 F (36.7 C) (02/16 1500) Temp Source: Oral (02/16 0807) BP: 114/50 mmHg (02/16 1454) Pulse Rate: 80 (02/16 1500)  Labs:  Recent Labs  12/16/14 0744  APTT 27  LABPROT 15.1  INR 1.17    Estimated Creatinine Clearance: 46.4 mL/min (by C-G formula based on Cr of 0.7).   Medical History: Past Medical History  Diagnosis Date  . HTN (hypertension)   . Hyperlipidemia   . Hypothyroidism   . Sinoatrial node dysfunction   . Aortic valve disorder   . Osteoporosis     , severe, hypercalciuria, Boniva since 2005, T10 compression fracture 2009  . Goiter   . Polymyalgia rheumatica   . Carpal tunnel syndrome   . Other disorder of calcium metabolism   . Macular degeneration   . DJD (degenerative joint disease)   . Atrial fibrillation     paroxysmal, status post successful vessel cardioversion 5/09, skains,transient left ventricular systolic dysfunction, likely secondary to cardioversion/stunning of the myocardium, no evidence significant CAD, echo 7/09 shows normal ejection fraction  . Mitral regurgitation   . Chronic anticoagulation   . Presence of permanent cardiac pacemaker   . Dysrhythmia   . Sinus drainage     in the morning, clear drainage  . GERD (gastroesophageal reflux disease)     OTC occasionally    Medications:  Prescriptions prior to admission  Medication Sig Dispense Refill Last Dose  . acetaminophen (TYLENOL) 500 MG tablet Take 500 mg by mouth every 8 (eight) hours as needed (pain).   12/15/2014 at Unknown time  . CALCIUM PO Take 500 mg by mouth 2 (two) times daily.   12/15/2014 at Unknown time  . Cholecalciferol (VITAMIN D PO) Take 1 tablet by mouth daily.   12/15/2014 at Unknown time   . Dextromethorphan-Guaifenesin 10-200 MG CAPS Take 1 tablet by mouth 2 (two) times daily as needed (cold symptoms).   Past Month at Unknown time  . docusate sodium (COLACE) 50 MG capsule Take by mouth daily as needed (constipation).    Past Week at Unknown time  . fluticasone (FLONASE) 50 MCG/ACT nasal spray Place 2 sprays into the nose daily as needed for allergies (nasal drip).    12/16/2014 at 0600  . furosemide (LASIX) 40 MG tablet Take 20 mg by mouth as needed for fluid.    Past Month at Unknown time  . levothyroxine (SYNTHROID, LEVOTHROID) 25 MCG tablet Take 25 mcg by mouth daily before breakfast.   3 12/16/2014 at 0600  . losartan (COZAAR) 25 MG tablet TAKE 1 TABLET BY MOUTH DAILY 30 tablet 5 12/15/2014 at Unknown time  . metoprolol succinate (TOPROL-XL) 100 MG 24 hr tablet TAKE 1 TABLET BY MOUTH EVERY DAY WITH OR IMMEDIATELY FOLLOWING A MEAL 30 tablet 6 12/16/2014 at 0600  . Multiple Vitamins-Minerals (ICAPS PO) Take 1 capsule by mouth 2 (two) times daily.    12/15/2014 at Unknown time  . OVER THE COUNTER MEDICATION Take by mouth daily as needed (nasal drip). Tylenol Cold - Mucous Severe (dextromethorphan, acetaminophen, phenylephrine, guaifenesin) - takes early morning if she wakes up with a nasal drip (1 swig)   Past Month at Unknown time  . warfarin (COUMADIN) 5 MG tablet Take as directed by anticoagulation clinic (Patient taking differently:  Take 2.5-5 mg by mouth one time only at 6 PM. Take 1/2 tablet (2.5 mg) on Saturdays, take 1 tablet (5 mg) on Sunday thru Friday) 40 tablet 3 12/11/2014  . Menthol, Topical Analgesic, (BENGAY EX) Apply 1 application topically 2 (two) times daily as needed (leg pain).   Unknown at Unknown time    Assessment: 81 year old woman s/p hip replacement surgery today.  She was on warfarin prior to admission for AFIB, home dose is 5mg  daily except 2.5mg  on Friday.  Her last dose was 2/11.  Her INR today is 1.17. Goal of Therapy:  INR 2-3   Plan:  Warfarin 7.5mg  x  1 dose tonight. Daily protimes  Candie Mile 12/16/2014,3:43 PM

## 2014-12-16 NOTE — Progress Notes (Signed)
Dentures returned to pt per request

## 2014-12-16 NOTE — Progress Notes (Signed)
Pt has c/o of pain in arch of right foot. Dr Rhona Raider here & aware. No new orders.

## 2014-12-16 NOTE — Anesthesia Procedure Notes (Signed)
Procedure Name: Intubation Date/Time: 12/16/2014 9:35 AM Performed by: Jenne Campus Pre-anesthesia Checklist: Patient identified, Emergency Drugs available, Suction available, Patient being monitored and Timeout performed Patient Re-evaluated:Patient Re-evaluated prior to inductionOxygen Delivery Method: Circle system utilized Preoxygenation: Pre-oxygenation with 100% oxygen Intubation Type: IV induction Ventilation: Mask ventilation without difficulty Laryngoscope Size: Miller and 2 Grade View: Grade I Tube type: Oral Tube size: 7.0 mm Number of attempts: 1 Airway Equipment and Method: Stylet Placement Confirmation: ETT inserted through vocal cords under direct vision,  positive ETCO2,  CO2 detector and breath sounds checked- equal and bilateral Secured at: 20 cm Tube secured with: Tape Dental Injury: Teeth and Oropharynx as per pre-operative assessment

## 2014-12-16 NOTE — Progress Notes (Signed)
Utilization review completed.  

## 2014-12-16 NOTE — Progress Notes (Signed)
Lunch relief by S. Gregson RN 

## 2014-12-16 NOTE — Interval H&P Note (Signed)
OK for surgery PD 

## 2014-12-16 NOTE — Anesthesia Postprocedure Evaluation (Signed)
  Anesthesia Post-op Note  Patient: Patricia Holt  Procedure(s) Performed: Procedure(s) (LRB): RIGHT TOTAL HIP ARTHROPLASTY ANTERIOR APPROACH (Right)  Patient Location: PACU  Anesthesia Type: General  Level of Consciousness: awake and alert   Airway and Oxygen Therapy: Patient Spontanous Breathing  Post-op Pain: mild  Post-op Assessment: Post-op Vital signs reviewed, Patient's Cardiovascular Status Stable, Respiratory Function Stable, Patent Airway and No signs of Nausea or vomiting  Last Vitals:  Filed Vitals:   12/16/14 1300  BP:   Pulse: 72  Temp:   Resp: 13    Post-op Vital Signs: stable   Complications: No apparent anesthesia complications

## 2014-12-16 NOTE — Transfer of Care (Signed)
Immediate Anesthesia Transfer of Care Note  Patient: Patricia Holt  Procedure(s) Performed: Procedure(s): RIGHT TOTAL HIP ARTHROPLASTY ANTERIOR APPROACH (Right)  Patient Location: PACU  Anesthesia Type:General  Level of Consciousness: awake, oriented and patient cooperative  Airway & Oxygen Therapy: Patient Spontanous Breathing and Patient connected to nasal cannula oxygen  Post-op Assessment: Report given to RN and Post -op Vital signs reviewed and stable  Post vital signs: Reviewed  Last Vitals:  Filed Vitals:   12/16/14 0807  BP: 168/95  Pulse: 88  Temp: 36.6 C  Resp: 20    Complications: No apparent anesthesia complications

## 2014-12-16 NOTE — Op Note (Signed)
PRE-OP DIAGNOSIS:  RIGHT HIP DEGENERATIVE JOINT DISEASE POST-OP DIAGNOSIS:  same PROCEDURE: RIGHT TOTAL HIP ARTHROPLASTY ANTERIOR APPROACH ANESTHESIA:  General SURGEON:  Melrose Nakayama MD ASSISTANT:  Loni Dolly PA-C   INDICATIONS FOR PROCEDURE:  The patient is a 81 y.o. female with a long history of a painful hip.  This has persisted despite multiple conservative measures.  The patient has persisted with pain and dysfunction making rest and activity difficult.  A total hip replacement is offered as surgical treatment.  Informed operative consent was obtained after discussion of possible complications including reaction to anesthesia, infection, neurovascular injury, dislocation, DVT, PE, and death.  The importance of the postoperative rehab program to optimize result was stressed with the patient.  SUMMARY OF FINDINGS AND PROCEDURE:  Under general anesthesia through a anterior approach an the Hana table a right THR was performed.  The patient had severe degenerative change and good bone quality.  We used DePuy components to replace the hip and these were size KA 16 Corail femur capped with a -2 72mm hip ball.  On the acetabular side we used a size 56 Gription shell with a  plus 4 neutral polyethylene liner.  We did use a hole eliminator.  Loni Dolly PA-C assisted throughout and was invaluable to the completion of the case in that he helped position and retract while I performed the procedure.  He also closed simultaneously to help minimize OR time.  I used fluoroscopy throughout the case to check position of implants and leg lengths and read all of these views myself.  DESCRIPTION OF PROCEDURE:  The patient was taken to the OR suite where general anesthetic was applied.  The patient was then positioned on the Hana table supine.  All bony prominences were appropriately padded.  Prep and drape was then performed in normal sterile fashion.  The patient was given kefzol preoperative antibiotic and an  appropriate time out was performed.  We then took an anterior approach to the right hip.  Dissection was taken through adipose to the tensor fascia lata fascia.  This structure was incised longitudinally and we dissected in the intermuscular interval just medial to this muscle.  Cobra retractors were placed superior and inferior to the femoral neck superficial to the capsule.  A capsular incision was then made and the retractors were placed along the femoral neck.  Xray was brought in to get a good level for the femoral neck cut which was made with an oscillating saw and osteotome.  The femoral head was removed with a corkscrew.  The acetabulum was exposed and some labral tissues were excised. Reaming was taken to the inside wall of the pelvis and sequentially up to 1 mm smaller than the actual component.  A trial of components was done and then the aforementioned acetabular shell was placed in appropriate tilt and anteversion confirmed by fluoroscopy. The liner was placed along with the hole eliminator and attention was turned to the femur.  The leg was brought down and over into adduction and the elevator bar was used to raise the femur up gently in the wound.  The piriformis was released with care taken to preserve the obturator internus attachment and all of the posterior capsule. The femur was reamed and then broached to the appropriate size.  A trial reduction was done and the aforementioned head and neck assembly gave [place] the best stability in extension with external rotation.  Leg lengths were felt to be about equal by fluoroscopic exam.  The trial components were removed and the wound irrigated.  We then placed the femoral component in appropriate anteversion.  The head was applied to a dry stem neck and the hip again reduced.  It was again stable in the aforementioned position.  The would was irrigated again followed by re-approximation of anterior capsule with ethibond suture. Tensor fascia was repaired  with V-loc suture  followed by subcutaneous closure with #O and #2 undyed vicryl.  Skin was closed with subQ stitch and steristrips followed by a sterile dressing.  EBL and IOF can be obtained from anesthesia records.  DISPOSITION:  The patient was extubated in the OR and taken to PACU in stable condition to be admitted to the Orthopedic Surgery for appropriate post-op care to include perioperative antibiotics and DVT prophylaxis.

## 2014-12-17 LAB — BASIC METABOLIC PANEL
Anion gap: 3 — ABNORMAL LOW (ref 5–15)
BUN: 11 mg/dL (ref 6–23)
CO2: 32 mmol/L (ref 19–32)
Calcium: 9 mg/dL (ref 8.4–10.5)
Chloride: 99 mmol/L (ref 96–112)
Creatinine, Ser: 0.69 mg/dL (ref 0.50–1.10)
GFR calc Af Amer: 87 mL/min — ABNORMAL LOW (ref >90)
GFR calc non Af Amer: 75 mL/min — ABNORMAL LOW (ref >90)
Glucose, Bld: 119 mg/dL — ABNORMAL HIGH (ref 70–99)
Potassium: 4.4 mmol/L (ref 3.5–5.1)
Sodium: 134 mmol/L — ABNORMAL LOW (ref 135–145)

## 2014-12-17 LAB — CBC
HCT: 30 % — ABNORMAL LOW (ref 36.0–46.0)
Hemoglobin: 9.6 g/dL — ABNORMAL LOW (ref 12.0–15.0)
MCH: 29 pg (ref 26.0–34.0)
MCHC: 32 g/dL (ref 30.0–36.0)
MCV: 90.6 fL (ref 78.0–100.0)
Platelets: 250 10*3/uL (ref 150–400)
RBC: 3.31 MIL/uL — ABNORMAL LOW (ref 3.87–5.11)
RDW: 14.4 % (ref 11.5–15.5)
WBC: 6 10*3/uL (ref 4.0–10.5)

## 2014-12-17 LAB — PROTIME-INR
INR: 1.45 (ref 0.00–1.49)
Prothrombin Time: 17.8 seconds — ABNORMAL HIGH (ref 11.6–15.2)

## 2014-12-17 MED ORDER — WARFARIN SODIUM 5 MG PO TABS
5.0000 mg | ORAL_TABLET | Freq: Once | ORAL | Status: AC
  Administered 2014-12-17: 5 mg via ORAL
  Filled 2014-12-17: qty 1

## 2014-12-17 NOTE — Progress Notes (Signed)
Subjective: 1 Day Post-Op Procedure(s) (LRB): RIGHT TOTAL HIP ARTHROPLASTY ANTERIOR APPROACH (Right)  Patient states she slept the best she has in 3 months and her hip feels great. She has some urinary incontinence which is normal for her at night. She is concerned that she is not voiding completely.   Activity level:  wbat Diet tolerance:  Eating well Voiding:  Ok  Patient reports pain as mild.    Objective: Vital signs in last 24 hours: Temp:  [97.4 F (36.3 C)-98 F (36.7 C)] 97.4 F (36.3 C) (02/17 0554) Pulse Rate:  [68-96] 96 (02/17 0554) Resp:  [13-20] 16 (02/17 0554) BP: (81-168)/(38-95) 117/43 mmHg (02/17 0554) SpO2:  [92 %-100 %] 95 % (02/17 0554) Weight:  [68.04 kg (150 lb)] 68.04 kg (150 lb) (02/16 0807)  Labs:  Recent Labs  12/16/14 1550 12/17/14 0539  HGB 10.9* 9.6*    Recent Labs  12/16/14 1550 12/17/14 0539  WBC 9.3 6.0  RBC 3.75* 3.31*  HCT 33.9* 30.0*  PLT 317 250    Recent Labs  12/16/14 1550 12/17/14 0539  NA  --  134*  K  --  4.4  CL  --  99  CO2  --  32  BUN  --  11  CREATININE 0.71 0.69  GLUCOSE  --  119*  CALCIUM  --  9.0    Recent Labs  12/16/14 0744 12/17/14 0539  INR 1.17 1.45    Physical Exam:  Neurologically intact ABD soft Neurovascular intact Sensation intact distally Intact pulses distally Dorsiflexion/Plantar flexion intact Incision: dressing C/D/I and no drainage No cellulitis present Compartment soft  Assessment/Plan:  1 Day Post-Op Procedure(s) (LRB): RIGHT TOTAL HIP ARTHROPLASTY ANTERIOR APPROACH (Right) Advance diet Up with therapy D/C IV fluids Plan for discharge tomorrow Discharge home with home health if doing well and cleared by PT. Follow up in office 2 weeks post op. Lovenox to coumadin for DVT prevention. We will monitor her urination closely and will bladder scan and possible in and out cath around lunch time if she has not voided completely.     Vasilisa Vore, Larwance Sachs 12/17/2014, 7:45  AM

## 2014-12-17 NOTE — Progress Notes (Addendum)
Pt complained distended with bladder and was not able to urinate fully. By PA verbal order ,did bladder scan at 0830. Pt had 542 ml of urinary left in bladder. I/O got 600 ml at 0900. Pt still can not go, then did Bladder scan at 3 pm, showed 169 ml of urine left. Will continue to monitor on her.

## 2014-12-17 NOTE — Evaluation (Signed)
Occupational Therapy Evaluation Patient Details Name: Patricia Holt MRN: [id number] DOB: 1924/01/27 Today's Date: 12/17/2014    History of Present Illness pt is an 81 y.o. female s/p Rt THA.    Clinical Impression   Patient mod I PTA. Patient currently requires mod assist for functional transfers and up to max assist for LB ADLs. Patient will benefit from acute OT to increase overall independence in the areas of ADLs, functional mobility, education on AE, overall safety in order to safely discharge to venue listed below. Feel patient will beneift from SNF rehab prior to discharging home with son and daughter-in law. Patient with increased pain and decreased strength/endurance during OT eval. IF patient refuses SNF, recommend HHOT.     Follow Up Recommendations  SNF;Supervision/Assistance - 24 hour    Equipment Recommendations   (TBD)    Recommendations for Other Services  None at this time.      Precautions / Restrictions Precautions Precautions: Fall Restrictions Weight Bearing Restrictions: Yes RLE Weight Bearing: Weight bearing as tolerated      Mobility Bed Mobility Overal bed mobility: Needs Assistance Bed Mobility: Supine to Sit     Supine to sit: Min assist;HOB elevated     General bed mobility comments: (A) to control Rt LE off EOB; cues for technique and sequencing ; incr time required due to pain   Transfers Overall transfer level: Needs assistance Equipment used: Rolling walker (2 wheeled) Transfers: Sit to/from Stand Sit to Stand: Mod assist         General transfer comment: cues for hand placement and safety, therapist assisted with lift and lowering patient. patient only able to do ~40% of sit<>stand, therapist assisted with 60%.     Balance Overall balance assessment: Needs assistance Sitting-balance support: No upper extremity supported;Feet supported Sitting balance-Leahy Scale: Good     Standing balance support: Bilateral upper  extremity supported Standing balance-Leahy Scale: Poor Standing balance comment: RW to balance      ADL Overall ADL's : Needs assistance/impaired Eating/Feeding: Independent   Grooming: Set up;Sitting   Upper Body Bathing: Moderate assistance;Sitting   Lower Body Bathing: Maximal assistance;Sit to/from stand   Upper Body Dressing : Moderate assistance;Sitting   Lower Body Dressing: Maximal assistance;Sit to/from stand   Toilet Transfer: Moderate assistance           Functional mobility during ADLs: Moderate assistance;Rolling walker General ADL Comments: Patient unable to cross or reach > BLEs for LB ADLs. Pt will benefit from use of AE, plan to demonstrate and work on teach back with AE tomorrow. Patient required mod assist for sit<>stands and with increased pain during sit<>stand transfers. Patient also limted secondary to her reports of tendonitis in right shoulder. Patient will benefit from additional SNF rehab prior to discharge home with son and daughter in law    Pertinent Vitals/Pain Pain Assessment: 0-10 Pain Score: 5  Pain Location: right hip Pain Descriptors / Indicators: Aching;Sore Pain Intervention(s): Monitored during session     Hand Dominance Right   Extremity/Trunk Assessment Upper Extremity Assessment Upper Extremity Assessment: RUE deficits/detail RUE Deficits / Details: Pt reports tendonitis in her shoulder. Therapist noted increased swelling and decreased shoulder ROM against gravity   Lower Extremity Assessment Lower Extremity Assessment: Defer to PT evaluation RLE Deficits / Details: quad 3/5  RLE Coordination: decreased gross motor   Cervical / Trunk Assessment Cervical / Trunk Assessment: Normal   Communication Communication Communication: No difficulties   Cognition Arousal/Alertness: Awake/alert Behavior During Therapy: WFL for tasks  assessed/performed Overall Cognitive Status: Within Functional Limits for tasks assessed               Home Living Family/patient expects to be discharged to:: Private residence Living Arrangements: Alone Available Help at Discharge: Family;Available 24 hours/day (son and daughter-in-law) Type of Home: House Home Access: Level entry     Home Layout: One level     Bathroom Shower/Tub: Tub/shower unit;Curtain   Bathroom Toilet: Handicapped height     Home Equipment: Environmental consultant - 2 wheels;Bedside commode;Tub bench;Hand held shower head          Prior Functioning/Environment Level of Independence: Independent with assistive device(s)        Comments: ambulating with cane due to pain     OT Diagnosis: Generalized weakness;Acute pain   OT Problem List: Decreased strength;Decreased activity tolerance;Decreased knowledge of use of DME or AE;Impaired balance (sitting and/or standing);Decreased range of motion;Decreased coordination;Decreased safety awareness;Decreased knowledge of precautions;Pain   OT Treatment/Interventions: Self-care/ADL training;Therapeutic exercise;Energy conservation;DME and/or AE instruction;Therapeutic activities;Patient/family education;Balance training    OT Goals(Current goals can be found in the care plan section) Acute Rehab OT Goals Patient Stated Goal: decrease pain OT Goal Formulation: With patient Time For Goal Achievement: 12/24/14 Potential to Achieve Goals: Good ADL Goals Pt Will Perform Grooming: with supervision;standing Pt Will Perform Lower Body Bathing: with min assist;with adaptive equipment;sit to/from stand Pt Will Perform Lower Body Dressing: with min assist;with adaptive equipment;sit to/from stand Pt Will Transfer to Toilet: with min assist;bedside commode;ambulating Pt Will Perform Toileting - Clothing Manipulation and hygiene: with min assist;sit to/from stand Pt Will Perform Tub/Shower Transfer: with min assist;rolling walker;tub bench;ambulating;Tub transfer  OT Frequency: Min 2X/week   Barriers to D/C: None known at this  time          End of Session Equipment Utilized During Treatment: Gait belt;Rolling walker  Activity Tolerance: Patient limited by pain Patient left: in chair;with call bell/phone within reach   Time: 1100-1118 OT Time Calculation (min): 18 min Charges:  OT General Charges $OT Visit: 1 Procedure OT Evaluation $Initial OT Evaluation Tier I: 1 Procedure  Trinette Vera , MS, OTR/L, CLT Pager: [phone]  12/17/2014, 11:31 AM

## 2014-12-17 NOTE — Progress Notes (Signed)
Physical Therapy Treatment Patient Details Name: Patricia Holt MRN: [id number] DOB: 1924-04-25 Today's Date: 12/17/2014    History of Present Illness pt is an 81 y.o. female s/p Rt THA.     PT Comments    Pt requiring max (A) for SPT this session due to decreased ability to WB through Rt UE. D/C disposition updated to reflect current mobility status. Will cont to follow per POC.   Follow Up Recommendations  SNF;Supervision/Assistance - 24 hour     Equipment Recommendations  None recommended by PT    Recommendations for Other Services       Precautions / Restrictions Precautions Precautions: Fall Restrictions Weight Bearing Restrictions: Yes RLE Weight Bearing: Weight bearing as tolerated    Mobility  Bed Mobility Overal bed mobility: Needs Assistance Bed Mobility: Sit to Supine       Sit to supine: Min assist   General bed mobility comments: (A) to bring Rt LE into supine position   Transfers Overall transfer level: Needs assistance Equipment used: Rolling walker (2 wheeled) Transfers: Sit to/from [facility] Sit to Stand: Mod assist Stand pivot transfers: Max assist       General transfer comment: pt very unsteady with attempting to stand; required mod (A) to power up due to decr ability to use Rt UE; limited to SPT to bed with max (A) to shift weight; pt with LOB and required immediate return to EOB  Ambulation/Gait             General Gait Details: limited to SPT only due to pain in Rt shoulder    Stairs            Wheelchair Mobility    Modified Rankin (Stroke Patients Only)       Balance Overall balance assessment: Needs assistance Sitting-balance support: Feet supported;No upper extremity supported Sitting balance-Leahy Scale: Fair   Postural control: Posterior lean Standing balance support: During functional activity;Bilateral upper extremity supported Standing balance-Leahy Scale: Poor Standing balance  comment: mod (A) to stand; very unsteady ; heavy lean posteriorly                    Cognition Arousal/Alertness: Awake/alert Behavior During Therapy: WFL for tasks assessed/performed Overall Cognitive Status: Within Functional Limits for tasks assessed                      Exercises Total Joint Exercises Ankle Circles/Pumps: AROM;Both;10 reps;Seated Quad Sets: AROM;Right;10 reps Heel Slides: AAROM;Right;10 reps;Supine Hip ABduction/ADduction: AAROM;Right;10 reps Long Arc Quad: AROM;Right;10 reps;Seated    General Comments        Pertinent Vitals/Pain Pain Assessment: 0-10 Pain Score: 10-Worst pain ever Pain Location: c/o 10/10 pain in Rt shoulder and hip during movement  Pain Descriptors / Indicators: Aching;Sore Pain Intervention(s): Limited activity within patient's tolerance;Premedicated before session;Monitored during session;Repositioned;Ice applied    Home Living                      Prior Function            PT Goals (current goals can now be found in the care plan section) Acute Rehab PT Goals Patient Stated Goal: to not have this shoulder pain PT Goal Formulation: With patient Time For Goal Achievement: 12/24/14 Potential to Achieve Goals: Good Progress towards PT goals: Not progressing toward goals - comment (due to pain in Rt UE and LE)    Frequency  7X/week    PT Plan Discharge  plan needs to be updated    Co-evaluation             End of Session Equipment Utilized During Treatment: Gait belt Activity Tolerance: Patient tolerated treatment well Patient left: in bed;with call bell/phone within reach     Time: 2458-0998 PT Time Calculation (min) (ACUTE ONLY): 25 min  Charges:  $Therapeutic Exercise: 8-22 mins $Therapeutic Activity: 8-22 mins                    G CodesGustavus Bryant, [place]  [phone] 12/17/2014, 5:02 PM

## 2014-12-17 NOTE — Progress Notes (Signed)
ANTICOAGULATION CONSULT NOTE - Follow- Up Consult  Pharmacy Consult for warfarin Indication: atrial fibrillation and VTE prophylaxis  No Known Allergies  Patient Measurements: Height: 5\' 7"  (170.2 cm) Weight: 150 lb (68.04 kg) IBW/kg (Calculated) : 61.6   Vital Signs: Temp: 98.3 F (36.8 C) (02/17 1300) BP: 112/57 mmHg (02/17 1300) Pulse Rate: 105 (02/17 1300)  Labs:  Recent Labs  12/16/14 0744 12/16/14 1550 12/17/14 0539  HGB  --  10.9* 9.6*  HCT  --  33.9* 30.0*  PLT  --  317 250  APTT 27  --   --   LABPROT 15.1  --  17.8*  INR 1.17  --  1.45  CREATININE  --  0.71 0.69    Estimated Creatinine Clearance: 46.4 mL/min (by C-G formula based on Cr of 0.69).   Medical History: Past Medical History  Diagnosis Date  . HTN (hypertension)   . Hyperlipidemia   . Hypothyroidism   . Sinoatrial node dysfunction   . Aortic valve disorder   . Osteoporosis     , severe, hypercalciuria, [place] since 2005, T10 compression fracture 2009  . Goiter   . Polymyalgia rheumatica   . Carpal tunnel syndrome   . Other disorder of calcium metabolism   . Macular degeneration   . DJD (degenerative joint disease)   . Atrial fibrillation     paroxysmal, status post successful vessel cardioversion 5/09, skains,transient left ventricular systolic dysfunction, likely secondary to cardioversion/stunning of the myocardium, no evidence significant CAD, echo 7/09 shows normal ejection fraction  . Mitral regurgitation   . Chronic anticoagulation   . Presence of permanent cardiac pacemaker   . Dysrhythmia   . Sinus drainage     in the morning, clear drainage  . GERD (gastroesophageal reflux disease)     OTC occasionally    Medications:  Prescriptions prior to admission  Medication Sig Dispense Refill Last Dose  . acetaminophen (TYLENOL) 500 MG tablet Take 500 mg by mouth every 8 (eight) hours as needed (pain).   12/15/2014 at Unknown time  . CALCIUM PO Take 500 mg by mouth 2 (two) times  daily.   12/15/2014 at Unknown time  . Cholecalciferol (VITAMIN D PO) Take 1 tablet by mouth daily.   12/15/2014 at Unknown time  . Dextromethorphan-Guaifenesin 10-200 MG CAPS Take 1 tablet by mouth 2 (two) times daily as needed (cold symptoms).   Past Month at Unknown time  . docusate sodium (COLACE) 50 MG capsule Take by mouth daily as needed (constipation).    Past Week at Unknown time  . fluticasone (FLONASE) 50 MCG/ACT nasal spray Place 2 sprays into the nose daily as needed for allergies (nasal drip).    12/16/2014 at 0600  . furosemide (LASIX) 40 MG tablet Take 20 mg by mouth as needed for fluid.    Past Month at Unknown time  . levothyroxine (SYNTHROID, LEVOTHROID) 25 MCG tablet Take 25 mcg by mouth daily before breakfast.   3 12/16/2014 at 0600  . losartan (COZAAR) 25 MG tablet TAKE 1 TABLET BY MOUTH DAILY 30 tablet 5 12/15/2014 at Unknown time  . metoprolol succinate (TOPROL-XL) 100 MG 24 hr tablet TAKE 1 TABLET BY MOUTH EVERY DAY WITH OR IMMEDIATELY FOLLOWING A MEAL 30 tablet 6 12/16/2014 at 0600  . Multiple Vitamins-Minerals (ICAPS PO) Take 1 capsule by mouth 2 (two) times daily.    12/15/2014 at Unknown time  . OVER THE COUNTER MEDICATION Take by mouth daily as needed (nasal drip). Tylenol Cold - Mucous  Severe (dextromethorphan, acetaminophen, phenylephrine, guaifenesin) - takes early morning if she wakes up with a nasal drip (1 swig)   Past Month at Unknown time  . warfarin (COUMADIN) 5 MG tablet Take as directed by anticoagulation clinic (Patient taking differently: Take 2.5-5 mg by mouth one time only at 6 PM. Take 1/2 tablet (2.5 mg) on Saturdays, take 1 tablet (5 mg) on Sunday thru Friday) 40 tablet 3 12/11/2014  . Menthol, Topical Analgesic, (BENGAY EX) Apply 1 application topically 2 (two) times daily as needed (leg pain).   Unknown at Unknown time    Assessment: 81 year old woman s/p hip replacement surgery today.  She was on warfarin prior to admission for AFIB, home dose is 5mg  daily  except 2.5mg  on Friday.  Her last dose was 2/11.  INR 1.17 >> 1.45.  Goal of Therapy:  INR 2-3   Plan:  Warfarin 5mg  x 1 dose tonight. Daily protimes  [employer], Pharm.D., BCPS Clinical Pharmacist Pager [phone] 12/17/2014 1:43 PM

## 2014-12-17 NOTE — Progress Notes (Signed)
12/17/14 Set up with [facility] for HHPT, RN by MD office. Spoke with patient, no change in discharge plan. Patient stated that she has a rolling walker and 3N1 at home and that her son and daughter in law will be available to assist her after d/c. Contacted Luellen Pucker with [facility], faxed [facility] order, face to face and pharmacy note as requested to [phone]. Received confirmation. Will continue to follow until discharge.

## 2014-12-17 NOTE — Progress Notes (Addendum)
Pt was I&O cathed at 9 am this am, 1st bladder scan at 5pm was less than 300 ccs, rescan at 8pm showed 495 ccs. Pt got up to [facility] and voided 150 ccs, bed pad was also wet with urine. Pt stated she felt like she had fully emptied her bladder after voiding and had no abdominal distention, post void residual was 234 ccs. Protocol does not call for I&O cath at this time. Will continue to monitor urine output closely.   Raquel James  12/17/2014   Pt was incontinent x2 overnight, saturated chucks pad in bed. States bladder does not feel full.

## 2014-12-17 NOTE — Evaluation (Signed)
Physical Therapy Evaluation Patient Details Name: Patricia Holt MRN: [id number] DOB: 23-Dec-1923 Today's Date: 12/17/2014   History of Present Illness  pt is an 81 y.o. female s/p Rt THA.   Clinical Impression  Pt is s/p Rt anterior THA POD#1 resulting in the deficits listed below (see PT Problem List). Pt will benefit from skilled PT to increase their independence and safety with mobility to allow discharge to the venue listed below. Pt independent PTA. Very motivated, anticipate good progress.      Follow Up Recommendations Home health PT;Supervision/Assistance - 24 hour    Equipment Recommendations  None recommended by PT    Recommendations for Other Services OT consult     Precautions / Restrictions Precautions Precautions: Fall Restrictions Weight Bearing Restrictions: Yes RLE Weight Bearing: Weight bearing as tolerated      Mobility  Bed Mobility Overal bed mobility: Needs Assistance Bed Mobility: Supine to Sit     Supine to sit: Min assist;HOB elevated     General bed mobility comments: (A) to control Rt LE off EOB; cues for technique and sequencing ; incr time required due to pain   Transfers Overall transfer level: Needs assistance Equipment used: Rolling walker (2 wheeled) Transfers: Sit to/from Stand Sit to Stand: Min guard         General transfer comment: cues for hand placement and safety; min guard to steady  Ambulation/Gait Ambulation/Gait assistance: Min guard Ambulation Distance (Feet): 15 Feet Assistive device: Rolling walker (2 wheeled) Gait Pattern/deviations: Step-to pattern;Decreased stance time - right;Decreased step length - left;Antalgic;Narrow base of support Gait velocity: decr Gait velocity interpretation: Below normal speed for age/gender General Gait Details: cues for step through gt sequencing and upright posture; pt limied in mobility due to pain and fatigue   Stairs            Wheelchair Mobility    Modified  Rankin (Stroke Patients Only)       Balance Overall balance assessment: Needs assistance Sitting-balance support: Feet supported;No upper extremity supported Sitting balance-Leahy Scale: Good     Standing balance support: Bilateral upper extremity supported;During functional activity Standing balance-Leahy Scale: Poor Standing balance comment: RW to balance                              Pertinent Vitals/Pain Pain Assessment: 0-10 Pain Score: 6  Pain Location: Rt hip Pain Descriptors / Indicators: Aching;Sore Pain Intervention(s): Monitored during session;Premedicated before session;Repositioned;Ice applied    Home Living Family/patient expects to be discharged to:: Private residence Living Arrangements: Alone Available Help at Discharge: Family;Available 24 hours/day Type of Home: House Home Access: Level entry     Home Layout: One level Home Equipment: Walker - 2 wheels;Bedside commode;Shower seat      Prior Function Level of Independence: Independent with assistive device(s)         Comments: ambulating with cane due to pain      Hand Dominance        Extremity/Trunk Assessment   Upper Extremity Assessment: Defer to OT evaluation           Lower Extremity Assessment: RLE deficits/detail RLE Deficits / Details: quad 3/5     Cervical / Trunk Assessment: Normal  Communication   Communication: No difficulties  Cognition Arousal/Alertness: Awake/alert Behavior During Therapy: WFL for tasks assessed/performed Overall Cognitive Status: Within Functional Limits for tasks assessed  General Comments General comments (skin integrity, edema, etc.): encouraged OOB activity with nursing as tolerated    Exercises Total Joint Exercises Ankle Circles/Pumps: AROM;Both;10 reps;Seated Quad Sets: AROM;Right;10 reps Hip ABduction/ADduction: AAROM;Right;10 reps Long Arc Quad: AROM;Right;10 reps;Seated       Assessment/Plan    PT Assessment Patient needs continued PT services  PT Diagnosis Difficulty walking;Generalized weakness;Acute pain   PT Problem List Decreased strength;Decreased activity tolerance;Decreased balance;Decreased mobility;Decreased knowledge of use of DME;Decreased safety awareness;Pain;Decreased range of motion  PT Treatment Interventions DME instruction;Gait training;Functional mobility training;Therapeutic activities;Therapeutic exercise;Balance training;Neuromuscular re-education;Patient/family education   PT Goals (Current goals can be found in the Care Plan section) Acute Rehab PT Goals Patient Stated Goal: to get better and go home with my son PT Goal Formulation: With patient Time For Goal Achievement: 12/24/14 Potential to Achieve Goals: Good    Frequency 7X/week   Barriers to discharge        Co-evaluation               End of Session Equipment Utilized During Treatment: Gait belt Activity Tolerance: Patient tolerated treatment well Patient left: in chair;with call bell/phone within reach;with family/visitor present Nurse Communication: Mobility status;Precautions         Time: 1001-1026 PT Time Calculation (min) (ACUTE ONLY): 25 min   Charges:   PT Evaluation $Initial PT Evaluation Tier I: 1 Procedure PT Treatments $Gait Training: 8-22 mins   PT G CodesGustavus Bryant, [place]  [phone] 12/17/2014, 10:40 AM

## 2014-12-18 ENCOUNTER — Encounter (HOSPITAL_COMMUNITY): Payer: Self-pay | Admitting: Orthopaedic Surgery

## 2014-12-18 DIAGNOSIS — M79609 Pain in unspecified limb: Secondary | ICD-10-CM

## 2014-12-18 LAB — CBC
HCT: 29 % — ABNORMAL LOW (ref 36.0–46.0)
Hemoglobin: 9.5 g/dL — ABNORMAL LOW (ref 12.0–15.0)
MCH: 30 pg (ref 26.0–34.0)
MCHC: 32.8 g/dL (ref 30.0–36.0)
MCV: 91.5 fL (ref 78.0–100.0)
Platelets: 236 10*3/uL (ref 150–400)
RBC: 3.17 MIL/uL — ABNORMAL LOW (ref 3.87–5.11)
RDW: 14.2 % (ref 11.5–15.5)
WBC: 7.6 10*3/uL (ref 4.0–10.5)

## 2014-12-18 LAB — PROTIME-INR
INR: 2.18 — ABNORMAL HIGH (ref 0.00–1.49)
Prothrombin Time: 24.5 seconds — ABNORMAL HIGH (ref 11.6–15.2)

## 2014-12-18 MED ORDER — WARFARIN SODIUM 5 MG PO TABS
5.0000 mg | ORAL_TABLET | ORAL | Status: DC
  Administered 2014-12-18: 5 mg via ORAL
  Filled 2014-12-18: qty 1

## 2014-12-18 MED ORDER — WARFARIN SODIUM 5 MG PO TABS: 2.5000 mg | ORAL_TABLET | ORAL | Status: DC

## 2014-12-18 NOTE — Progress Notes (Signed)
Occupational Therapy Treatment Patient Details Name: Patricia Holt MRN: [id number] DOB: [birth year] Today's Date: 12/18/2014    History of present illness pt is an 81 y.o. female s/p Rt THA.    OT comments  Patient progressing towards goals, continue plan of care for now. Patient continues to require up to mod assist for sit<>stands and functional transfers. Patient agreed with SNF recommendation during OT treat.    Follow Up Recommendations  SNF;Supervision/Assistance - 24 hour    Equipment Recommendations   (TBD)    Recommendations for Other Services  None at this time    Precautions / Restrictions Precautions Precautions: Fall Restrictions Weight Bearing Restrictions: Yes RLE Weight Bearing: Weight bearing as tolerated       Mobility Bed Mobility Overal bed mobility: Needs Assistance Bed Mobility: Supine to Sit     Supine to sit: Min guard  Transfers Overall transfer level: Needs assistance Equipment used: Rolling walker (2 wheeled) Transfers: Sit to/from [facility] Sit to Stand: Mod assist Stand pivot transfers: Mod assist       General transfer comment: cues required for hand placement and safety    Balance Overall balance assessment: Needs assistance Sitting-balance support: Feet supported;No upper extremity supported Sitting balance-Leahy Scale: Fair     Standing balance support: Bilateral upper extremity supported Standing balance-Leahy Scale: Poor   ADL Overall ADL's : Needs assistance/impaired Eating/Feeding: Independent;Sitting   Grooming: Set up;Sitting   Upper Body Bathing: Moderate assistance;Sitting   Lower Body Bathing: Maximal assistance;Sit to/from stand   Upper Body Dressing : Moderate assistance;Sitting   Lower Body Dressing: Maximal assistance;Sit to/from stand   Toilet Transfer: Moderate assistance Functional mobility during ADLs: Moderate assistance;Rolling walker General ADL Comments: Patient continues  to need up to mod assist for functional transfers and sit<>stands using RW. Patient required frequent cues for safety and hand placement during all transfers. Patient made comment, "I don't know how I'm going to do this at home". Therapist talked with patient about SNF placement and patient agreed.      Cognition   Behavior During Therapy: WFL for tasks assessed/performed Overall Cognitive Status: Within Functional Limits for tasks assessed                 Pertinent Vitals/ Pain       Pain Assessment: 0-10 Pain Score: 2  (after transfer OOB) Pain Location: RLE Pain Descriptors / Indicators: Sore Pain Intervention(s): Monitored during session         Frequency Min 2X/week     Progress Toward Goals  OT Goals(current goals can now be found in the care plan section)  Progress towards OT goals: Progressing toward goals     Plan Discharge plan remains appropriate       End of Session Equipment Utilized During Treatment: Gait belt;Rolling walker   Activity Tolerance Patient tolerated treatment well   Patient Left in chair;with call bell/phone within reach     Time: 4098-1191 OT Time Calculation (min): 18 min  Charges: OT General Charges $OT Visit: 1 Procedure OT Treatments $Therapeutic Activity: 8-22 mins  Deloyce Walthers , MS, OTR/L, CLT Pager: [phone]  12/18/2014, 8:52 AM

## 2014-12-18 NOTE — Progress Notes (Signed)
Physical Therapy Treatment Patient Details Name: JARIELYS GIRARDOT MRN: [id number] DOB: [birth year] Today's Date: 12/18/2014    History of Present Illness pt is an 81 y.o. female s/p Rt THA.     PT Comments    Pt mobilizing today with min to mod (A). Was able to WB through Rt UE today and no c/o pain. Cont to recommend SNF, pt concerned she cannot mobilize at home at this time. Will cont to follow per POC.   Follow Up Recommendations  SNF;Supervision/Assistance - 24 hour     Equipment Recommendations  None recommended by PT    Recommendations for Other Services       Precautions / Restrictions Precautions Precautions: Fall Restrictions Weight Bearing Restrictions: Yes RLE Weight Bearing: Weight bearing as tolerated    Mobility  Bed Mobility Overal bed mobility: Needs Assistance Bed Mobility: Supine to Sit     Supine to sit: Min guard     General bed mobility comments: up in chair  Transfers Overall transfer level: Needs assistance Equipment used: Rolling walker (2 wheeled) Transfers: Sit to/from Stand Sit to Stand: Min assist Stand pivot transfers: Mod assist       General transfer comment: cues for hand placement; pt with LOB posteriorly; min (A) to balance   Ambulation/Gait Ambulation/Gait assistance: Mod assist Ambulation Distance (Feet): 20 Feet Assistive device: Rolling walker (2 wheeled) Gait Pattern/deviations: Step-through pattern;Decreased step length - left;Decreased stance time - right;Antalgic;Shuffle;Narrow base of support Gait velocity: decr Gait velocity interpretation: Below normal speed for age/gender General Gait Details: cues for widen BOS and safety with RW; min (A) to balance and manage RW   Stairs            Wheelchair Mobility    Modified Rankin (Stroke Patients Only)       Balance Overall balance assessment: Needs assistance Sitting-balance support: No upper extremity supported;Feet supported Sitting balance-Leahy  Scale: Fair   Postural control: Posterior lean Standing balance support: During functional activity;Bilateral upper extremity supported Standing balance-Leahy Scale: Poor Standing balance comment: required (A) with ADLs and to maintain balance                    Cognition Arousal/Alertness: Awake/alert Behavior During Therapy: WFL for tasks assessed/performed Overall Cognitive Status: Within Functional Limits for tasks assessed                      Exercises Total Joint Exercises Ankle Circles/Pumps: AROM;Both;10 reps;Seated Quad Sets: AROM;Right;10 reps Heel Slides: AAROM;Right;10 reps;Supine Hip ABduction/ADduction: AAROM;Right;10 reps Long Arc Quad: AROM;Right;10 reps;Seated    General Comments        Pertinent Vitals/Pain Pain Assessment: 0-10 Pain Score: 4  Pain Location: when up on Rt hip Pain Descriptors / Indicators: Aching Pain Intervention(s): Premedicated before session;Monitored during session;Repositioned;Ice applied    Home Living                      Prior Function            PT Goals (current goals can now be found in the care plan section) Acute Rehab PT Goals Patient Stated Goal: to go to rehab then home PT Goal Formulation: With patient Time For Goal Achievement: 12/24/14 Potential to Achieve Goals: Good Progress towards PT goals: Progressing toward goals    Frequency  7X/week    PT Plan Current plan remains appropriate    Co-evaluation  End of Session Equipment Utilized During Treatment: Gait belt Activity Tolerance: Patient tolerated treatment well Patient left: in chair;with call bell/phone within reach     Time: 0940-1004 PT Time Calculation (min) (ACUTE ONLY): 24 min  Charges:  $Gait Training: 8-22 mins $Therapeutic Exercise: 8-22 mins                    G CodesGustavus Bryant, [place]  [phone] 12/18/2014, 10:42 AM

## 2014-12-18 NOTE — Progress Notes (Signed)
Subjective: 2 Days Post-Op Procedure(s) (LRB): RIGHT TOTAL HIP ARTHROPLASTY ANTERIOR APPROACH (Right)   Patient still complains of right foot pain with ambulation. She states that the pain goes up her leg when she puts weight on her foot. She is on lovenox and coumadin for DVT prevention.   Activity level:  wbat Diet tolerance:  Eating well Voiding:  ok Patient reports pain as mild and moderate.    Objective: Vital signs in last 24 hours: Temp:  [97.8 F (36.6 C)-98.4 F (36.9 C)] 98.3 F (36.8 C) (02/18 1300) Pulse Rate:  [91-103] 103 (02/18 1300) Resp:  [16-17] 16 (02/18 1300) BP: (103-121)/(51-67) 103/51 mmHg (02/18 1300) SpO2:  [90 %-99 %] 99 % (02/18 1300)  Labs:  Recent Labs  12/16/14 1550 12/17/14 0539 12/18/14 0700  HGB 10.9* 9.6* 9.5*    Recent Labs  12/17/14 0539 12/18/14 0700  WBC 6.0 7.6  RBC 3.31* 3.17*  HCT 30.0* 29.0*  PLT 250 236    Recent Labs  12/16/14 1550 12/17/14 0539  NA  --  134*  K  --  4.4  CL  --  99  CO2  --  32  BUN  --  11  CREATININE 0.71 0.69  GLUCOSE  --  119*  CALCIUM  --  9.0    Recent Labs  12/17/14 0539 12/18/14 0700  INR 1.45 2.18*    Physical Exam:  Neurologically intact ABD soft Neurovascular intact Sensation intact distally Intact pulses distally Dorsiflexion/Plantar flexion intact Incision: dressing C/D/I No cellulitis present Compartment soft  Assessment/Plan:  2 Days Post-Op Procedure(s) (LRB): RIGHT TOTAL HIP ARTHROPLASTY ANTERIOR APPROACH (Right) Advance diet Up with therapy Plan for discharge tomorrow Discharge to [facility]. We will get a doppler of her right lower extremity to rule out DVT even though it would be rare on lovenox and coumadin. Continue current DVT precautions .  Follow up in office 2 weeks post op. Continue current pain meds.   Patricia Holt, Larwance Sachs 12/18/2014, 1:49 PM

## 2014-12-18 NOTE — Progress Notes (Signed)
Physical Therapy Treatment Patient Details Name: Patricia Holt MRN: [id number] DOB: 28-Feb-1924 Today's Date: 12/18/2014    History of Present Illness pt is an 81 y.o. female s/p Rt THA.     PT Comments    Pt limited due to c/o "burning pain" in plantar surface of foot. Pt (A) back in bed with SPT and mod (A). Pt denied any calf pain with squeeze test of passive DF. Pt hopeful to D/C to [facility] tomorrow, discussed car transfer technique with pt; will review with pt and son tomorrow.   Follow Up Recommendations  SNF;Supervision/Assistance - 24 hour     Equipment Recommendations  None recommended by PT    Recommendations for Other Services       Precautions / Restrictions Precautions Precautions: Fall Restrictions Weight Bearing Restrictions: Yes RLE Weight Bearing: Weight bearing as tolerated    Mobility  Bed Mobility Overal bed mobility: Needs Assistance Bed Mobility: Sit to Supine       Sit to supine: Min assist   General bed mobility comments: (A) to lift Rt LE into bed  Transfers Overall transfer level: Needs assistance Equipment used: Rolling walker (2 wheeled) Transfers: Sit to/from [facility] Sit to Stand: Mod assist Stand pivot transfers: Mod assist       General transfer comment: pt required 2 attempts to achieve sit to stand; pt with heavy lean posteriorly; limited to SPT back to bed due to pain   Ambulation/Gait             General Gait Details: SPT only back to bed due to pain    Stairs            Wheelchair Mobility    Modified Rankin (Stroke Patients Only)       Balance Overall balance assessment: Needs assistance Sitting-balance support: Feet supported;No upper extremity supported Sitting balance-Leahy Scale: Fair   Postural control: Posterior lean Standing balance support: During functional activity;Bilateral upper extremity supported Standing balance-Leahy Scale: Poor Standing balance comment:  required (A) and RW to maintain balance; heavy lean posteriorly                    Cognition Arousal/Alertness: Awake/alert Behavior During Therapy: WFL for tasks assessed/performed Overall Cognitive Status: Within Functional Limits for tasks assessed                      Exercises Total Joint Exercises Ankle Circles/Pumps: AROM;Both;10 reps;Seated Heel Slides: AAROM;Right;10 reps;Supine Hip ABduction/ADduction: AAROM;Right;10 reps Long Arc Quad: AROM;Right;10 reps;Seated    General Comments General comments (skin integrity, edema, etc.): no c/o calf pain during sessin; c/o "burning pain" on plantar surface of foot      Pertinent Vitals/Pain Pain Assessment: 0-10 Pain Score: 9  Pain Location: Rt hip and plantar surface of foot Pain Descriptors / Indicators: Aching;Burning Pain Intervention(s): Monitored during session;Limited activity within patient's tolerance;Premedicated before session;Repositioned    Home Living                      Prior Function            PT Goals (current goals can now be found in the care plan section) Acute Rehab PT Goals Patient Stated Goal: to not have all this pain  PT Goal Formulation: With patient Time For Goal Achievement: 12/24/14 Potential to Achieve Goals: Good Progress towards PT goals: Progressing toward goals    Frequency  7X/week    PT Plan Current plan  remains appropriate    Co-evaluation             End of Session Equipment Utilized During Treatment: Gait belt Activity Tolerance: Patient limited by pain Patient left: in bed;with call bell/phone within reach     Time: 1459-1523 PT Time Calculation (min) (ACUTE ONLY): 24 min  Charges:  $Therapeutic Exercise: 8-22 mins $Therapeutic Activity: 8-22 mins                    G CodesGustavus Bryant, [place]  [phone] 12/18/2014, 4:35 PM

## 2014-12-18 NOTE — Progress Notes (Signed)
*  PRELIMINARY RESULTS* Vascular Ultrasound Right lower extremity venous duplex has been completed.  Preliminary findings: no evidence of DVT  Landry Mellow, RDMS, RVT  12/18/2014, 5:45 PM

## 2014-12-18 NOTE — Progress Notes (Signed)
ANTICOAGULATION CONSULT NOTE - Follow- Up Consult  Pharmacy Consult for warfarin Indication: atrial fibrillation and VTE prophylaxis  No Known Allergies  Patient Measurements: Height: 5\' 7"  (170.2 cm) Weight: 150 lb (68.04 kg) IBW/kg (Calculated) : 61.6   Vital Signs: Temp: 97.8 F (36.6 C) (02/18 0548) Temp Source: Oral (02/18 0548) BP: 121/67 mmHg (02/18 0548) Pulse Rate: 103 (02/18 0548)  Labs:  Recent Labs  12/16/14 0744  12/16/14 1550 12/17/14 0539 12/18/14 0700  HGB  --   < > 10.9* 9.6* 9.5*  HCT  --   --  33.9* 30.0* 29.0*  PLT  --   --  317 250 236  APTT 27  --   --   --   --   LABPROT 15.1  --   --  17.8* 24.5*  INR 1.17  --   --  1.45 2.18*  CREATININE  --   --  0.71 0.69  --   < > = values in this interval not displayed.  Estimated Creatinine Clearance: 46.4 mL/min (by C-G formula based on Cr of 0.69).  Assessment: 81 year old woman s/p hip replacement surgery. She was on warfarin prior to admission for AFib- home dose is 5mg  daily except 2.5mg  on Friday.  Her last dose was 2/11.  INR has come up nicely- this morning is therapeutic at 2.18.  She is also on prophylactic Lovenox.  Goal of Therapy:  INR 2-3   Plan:  -discontinue Lovenox with therapeutic INR -resume home warfarin dosing- 5mg  daily except 2.5mg  on Fridays -recommend INR check on Tuesday or Wednesday of next week   Reshunda Strider D. Marquan Vokes, PharmD, BCPS Clinical Pharmacist Pager: [phone] 12/18/2014 12:45 PM

## 2014-12-19 DIAGNOSIS — Z7901 Long term (current) use of anticoagulants: Secondary | ICD-10-CM | POA: Diagnosis not present

## 2014-12-19 DIAGNOSIS — M1611 Unilateral primary osteoarthritis, right hip: Secondary | ICD-10-CM | POA: Diagnosis not present

## 2014-12-19 DIAGNOSIS — M8088XS Other osteoporosis with current pathological fracture, vertebra(e), sequela: Secondary | ICD-10-CM | POA: Diagnosis not present

## 2014-12-19 DIAGNOSIS — K5900 Constipation, unspecified: Secondary | ICD-10-CM | POA: Diagnosis not present

## 2014-12-19 DIAGNOSIS — K5901 Slow transit constipation: Secondary | ICD-10-CM | POA: Diagnosis not present

## 2014-12-19 DIAGNOSIS — Z96641 Presence of right artificial hip joint: Secondary | ICD-10-CM | POA: Diagnosis not present

## 2014-12-19 DIAGNOSIS — I10 Essential (primary) hypertension: Secondary | ICD-10-CM | POA: Diagnosis not present

## 2014-12-19 DIAGNOSIS — I4891 Unspecified atrial fibrillation: Secondary | ICD-10-CM | POA: Diagnosis not present

## 2014-12-19 DIAGNOSIS — M6281 Muscle weakness (generalized): Secondary | ICD-10-CM | POA: Diagnosis not present

## 2014-12-19 DIAGNOSIS — E039 Hypothyroidism, unspecified: Secondary | ICD-10-CM | POA: Diagnosis not present

## 2014-12-19 DIAGNOSIS — R2681 Unsteadiness on feet: Secondary | ICD-10-CM | POA: Diagnosis not present

## 2014-12-19 DIAGNOSIS — E785 Hyperlipidemia, unspecified: Secondary | ICD-10-CM | POA: Diagnosis not present

## 2014-12-19 DIAGNOSIS — J309 Allergic rhinitis, unspecified: Secondary | ICD-10-CM | POA: Diagnosis not present

## 2014-12-19 DIAGNOSIS — Z950 Presence of cardiac pacemaker: Secondary | ICD-10-CM | POA: Diagnosis not present

## 2014-12-19 DIAGNOSIS — Z9889 Other specified postprocedural states: Secondary | ICD-10-CM | POA: Diagnosis not present

## 2014-12-19 DIAGNOSIS — I480 Paroxysmal atrial fibrillation: Secondary | ICD-10-CM | POA: Diagnosis not present

## 2014-12-19 DIAGNOSIS — Z471 Aftercare following joint replacement surgery: Secondary | ICD-10-CM | POA: Diagnosis not present

## 2014-12-19 DIAGNOSIS — D62 Acute posthemorrhagic anemia: Secondary | ICD-10-CM | POA: Diagnosis not present

## 2014-12-19 DIAGNOSIS — R278 Other lack of coordination: Secondary | ICD-10-CM | POA: Diagnosis not present

## 2014-12-19 DIAGNOSIS — R609 Edema, unspecified: Secondary | ICD-10-CM | POA: Diagnosis not present

## 2014-12-19 LAB — CBC
HCT: 29.4 % — ABNORMAL LOW (ref 36.0–46.0)
Hemoglobin: 9.5 g/dL — ABNORMAL LOW (ref 12.0–15.0)
MCH: 28.9 pg (ref 26.0–34.0)
MCHC: 32.3 g/dL (ref 30.0–36.0)
MCV: 89.4 fL (ref 78.0–100.0)
Platelets: 315 10*3/uL (ref 150–400)
RBC: 3.29 MIL/uL — ABNORMAL LOW (ref 3.87–5.11)
RDW: 14.3 % (ref 11.5–15.5)
WBC: 8.3 10*3/uL (ref 4.0–10.5)

## 2014-12-19 LAB — PROTIME-INR
INR: 2.09 — ABNORMAL HIGH (ref 0.00–1.49)
Prothrombin Time: 23.6 seconds — ABNORMAL HIGH (ref 11.6–15.2)

## 2014-12-19 MED ORDER — METHOCARBAMOL 500 MG PO TABS: 500.0000 mg | ORAL_TABLET | Freq: Four times a day (QID) | ORAL | Status: DC | PRN

## 2014-12-19 MED ORDER — HYDROCODONE-ACETAMINOPHEN 5-325 MG PO TABS: 1.0000 | ORAL_TABLET | ORAL | Status: DC | PRN

## 2014-12-19 MED ORDER — WARFARIN SODIUM 5 MG PO TABS: ORAL_TABLET | ORAL | Status: DC

## 2014-12-19 NOTE — Progress Notes (Signed)
ANTICOAGULATION CONSULT NOTE - Follow Up Consult  Pharmacy Consult for coumadin Indication: afib  No Known Allergies  Patient Measurements: Height: 5\' 7"  (170.2 cm) Weight: 150 lb (68.04 kg) IBW/kg (Calculated) : 61.6 Heparin Dosing Weight:   Vital Signs: Temp: 98.2 F (36.8 C) (02/19 0445) BP: 106/58 mmHg (02/19 0445) Pulse Rate: 104 (02/19 0445)  Labs:  Recent Labs  12/16/14 1550 12/17/14 0539 12/18/14 0700 12/19/14 0646  HGB 10.9* 9.6* 9.5* 9.5*  HCT 33.9* 30.0* 29.0* 29.4*  PLT 317 250 236 315  LABPROT  --  17.8* 24.5* 23.6*  INR  --  1.45 2.18* 2.09*  CREATININE 0.71 0.69  --   --     Estimated Creatinine Clearance: 46.4 mL/min (by C-G formula based on Cr of 0.69).   Medications:    Assessment: 81 yo female s/p surgery is currently on therapeutic coumadin for hx of afib.  INR is 2.09 today. Goal of Therapy:  INR 2-3    Plan:  - continue coumadin 5 mg po daily except 2.5 mg on Fridays - patient will be discharged  Shatoria Stooksbury, Tsz-Yin 12/19/2014,9:15 AM

## 2014-12-19 NOTE — Progress Notes (Signed)
Physical Therapy Treatment Patient Details Name: Patricia Holt MRN: [id number] DOB: 02/16/1924 Today's Date: 12/19/2014    History of Present Illness pt is an 81 y.o. female s/p Rt THA.     PT Comments    Pt very motivated today to progress with therapy. Initially c/o lightheadedness but reported it subsided. Pt hopeful to D/C to [facility] today. Reviewed car transfer technique.   Follow Up Recommendations  SNF;Supervision/Assistance - 24 hour     Equipment Recommendations  None recommended by PT    Recommendations for Other Services       Precautions / Restrictions Precautions Precautions: Fall Restrictions Weight Bearing Restrictions: Yes RLE Weight Bearing: Weight bearing as tolerated    Mobility  Bed Mobility               General bed mobility comments: up in chair  Transfers Overall transfer level: Needs assistance Equipment used: Rolling walker (2 wheeled) Transfers: Sit to/from Stand Sit to Stand: Mod assist         General transfer comment: pt with lean posteriorly; initially lightheaded; required return to sitting and upon 2nd standing denied lightheadedness; cues for technique   Ambulation/Gait Ambulation/Gait assistance: Min assist Ambulation Distance (Feet): 30 Feet Assistive device: Rolling walker (2 wheeled) Gait Pattern/deviations: Step-to pattern;Decreased stance time - right;Decreased step length - left;Narrow base of support;Trunk flexed;Shuffle Gait velocity: decr Gait velocity interpretation: Below normal speed for age/gender General Gait Details: cues for upright posture and step through gt sequencing; pt tending to ER Rt LE; cues for toe fwd; (A) to balance and manage RW   Stairs            Wheelchair Mobility    Modified Rankin (Stroke Patients Only)       Balance Overall balance assessment: Needs assistance Sitting-balance support: Feet supported;No upper extremity supported Sitting balance-Leahy Scale: Fair      Standing balance support: During functional activity;Bilateral upper extremity supported Standing balance-Leahy Scale: Poor Standing balance comment: RW to balance; posterior lean                    Cognition Arousal/Alertness: Awake/alert Behavior During Therapy: WFL for tasks assessed/performed Overall Cognitive Status: Within Functional Limits for tasks assessed                      Exercises Total Joint Exercises Ankle Circles/Pumps: AROM;Both;10 reps;Seated Quad Sets: AROM;Right;10 reps Gluteal Sets: AROM;10 reps Hip ABduction/ADduction: AAROM;Right;10 reps Long Arc Quad: AROM;Right;10 reps;Seated    General Comments        Pertinent Vitals/Pain Pain Score: 4  Pain Location: Rt hip/groin region Pain Descriptors / Indicators: Aching Pain Intervention(s): Monitored during session;Repositioned;Premedicated before session    Home Living                      Prior Function            PT Goals (current goals can now be found in the care plan section) Acute Rehab PT Goals Patient Stated Goal: to get better and go home PT Goal Formulation: With patient Time For Goal Achievement: 12/24/14 Potential to Achieve Goals: Good Progress towards PT goals: Progressing toward goals    Frequency  7X/week    PT Plan Current plan remains appropriate    Co-evaluation             End of Session Equipment Utilized During Treatment: Gait belt Activity Tolerance: Patient tolerated treatment well Patient left: in  chair;with call bell/phone within reach     Time: 0839-0909 PT Time Calculation (min) (ACUTE ONLY): 30 min  Charges:  $Gait Training: 8-22 mins $Therapeutic Exercise: 8-22 mins                    G Codes:      Gustavus Bryant, [place]  [phone] 12/19/2014, 10:37 AM

## 2014-12-19 NOTE — Progress Notes (Signed)
Hand off report gave to Liz(supervisor) at [facility]. Discharge instruction gave to pt and her son, pt is ready to discharge.

## 2014-12-19 NOTE — Clinical Social Work Psychosocial (Signed)
Clinical Social Work Department BRIEF PSYCHOSOCIAL ASSESSMENT 12/19/2014  Patient:  Patricia Holt, Patricia Holt     Account Number:  [id number]     Admit date:  12/16/2014  Clinical Social Worker:  Wylene Men  Date/Time:  12/18/2014 07:58 AM  Referred by:  Physician  Date Referred:  12/18/2014 Referred for  SNF Placement  Psychosocial assessment   Other Referral:   none   Interview type:  Patient Other interview type:    PSYCHOSOCIAL DATA Living Status:  ALONE Admitted from facility:   Level of care:   Primary support name:  Patricia Holt Primary support relationship to patient:  CHILD, ADULT Degree of support available:   adequate    CURRENT CONCERNS Current Concerns  Post-Acute Placement   Other Concerns:   none    SOCIAL WORK ASSESSMENT / PLAN CSW assessed patient at bedside.  Patient was alert and oriented x4.  PT is recommending SNF/STR at time of dishcarge.  Patient is aware and agreeable to this recommendation.    Patient reports being from home alone with intermittent supervision.  Patient states her main support is her son, Patricia Holt).  Patient reports being "ready" for STR and "ready" to get back home.  Patient's son will provide transportation to SNF at time of dc.   Assessment/plan status:  Psychosocial Support/Ongoing Assessment of Needs Other assessment/ plan:   FL2  PASARR   Information/referral to community resources:   SNF/STR    PATIENT'S/FAMILY'S RESPONSE TO PLAN OF CARE: Patient is agreeable to SNF/STR search of [facility].       [name], [place] [phone]  Psychiatric & Orthopedics (5N 1-16) Clinical Social Worker

## 2014-12-19 NOTE — Discharge Summary (Signed)
Patient ID: Patricia Holt MRN: [id number] DOB/AGE: 30-Apr-1924 81 y.o.  Admit date: 12/16/2014 Discharge date: 12/19/2014  Admission Diagnoses:  Principal Problem:   Primary osteoarthritis of right hip   Discharge Diagnoses:  Same  Past Medical History  Diagnosis Date  . HTN (hypertension)   . Hyperlipidemia   . Hypothyroidism   . Sinoatrial node dysfunction   . Aortic valve disorder   . Osteoporosis     , severe, hypercalciuria, Bon[place] since 2005, T10 compression fracture 2009  . Goiter   . Polymyalgia rheumatica   . Carpal tunnel syndrome   . Other disorder of calcium metabolism   . Macular degeneration   . DJD (degenerative joint disease)   . Atrial fibrillation     paroxysmal, status post successful vessel cardioversion 5/09, skains,transient left ventricular systolic dysfunction, likely secondary to cardioversion/stunning of the myocardium, no evidence significant CAD, echo 7/09 shows normal ejection fraction  . Mitral regurgitation   . Chronic anticoagulation   . Presence of permanent cardiac pacemaker   . Dysrhythmia   . Sinus drainage     in the morning, clear drainage  . GERD (gastroesophageal reflux disease)     OTC occasionally    Surgeries: Procedure(s): RIGHT TOTAL HIP ARTHROPLASTY ANTERIOR APPROACH on 12/16/2014   Consultants:    Discharged Condition: Improved  Hospital Course: Patricia Holt is an 81 y.o. female who was admitted 12/16/2014 for operative treatment ofPrimary osteoarthritis of right hip. Patient has severe unremitting pain that affects sleep, daily activities, and work/hobbies. After pre-op clearance the patient was taken to the operating room on 12/16/2014 and underwent  Procedure(s): RIGHT TOTAL HIP ARTHROPLASTY ANTERIOR APPROACH.    Patient was given perioperative antibiotics: Anti-infectives    Start     Dose/Rate Route Frequency Ordered Stop   12/16/14 1600  ceFAZolin (ANCEF) IVPB 2 g/50 mL premix     2 g 100 mL/hr over 30  Minutes Intravenous Every 6 hours 12/16/14 1530 12/16/14 2137   12/16/14 0600  ceFAZolin (ANCEF) IVPB 2 g/50 mL premix     2 g 100 mL/hr over 30 Minutes Intravenous On call to O.R. 12/15/14 1350 12/16/14 0936       Patient was given sequential compression devices, early ambulation, and chemoprophylaxis to prevent DVT.  Patient had some postoperative pain in her operative leg that was worrisome for DVT. A venous doppler was preformed which was negative.  Patient benefited maximally from hospital stay and there were no complications.    Recent vital signs: Patient Vitals for the past 24 hrs:  BP Temp Temp src Pulse Resp SpO2  12/19/14 0445 (!) 106/58 mmHg 98.2 F (36.8 C) - (!) 104 16 94 %  12/19/14 0336 - - - - 16 94 %  12/18/14 2312 - - - - 16 94 %  12/18/14 2002 110/63 mmHg 98.4 F (36.9 C) - (!) 102 16 94 %  12/18/14 1843 (!) 100/44 mmHg 98.7 F (37.1 C) Oral (!) 105 16 96 %  12/18/14 1300 (!) 103/51 mmHg 98.3 F (36.8 C) - (!) 103 16 99 %     Recent laboratory studies:  Recent Labs  12/16/14 1550 12/17/14 0539 12/18/14 0700 12/19/14 0646  WBC 9.3 6.0 7.6 8.3  HGB 10.9* 9.6* 9.5* 9.5*  HCT 33.9* 30.0* 29.0* 29.4*  PLT 317 250 236 315  NA  --  134*  --   --   K  --  4.4  --   --   CL  --  99  --   --   CO2  --  32  --   --   BUN  --  11  --   --   CREATININE 0.71 0.69  --   --   GLUCOSE  --  119*  --   --   INR  --  1.45 2.18*  --   CALCIUM  --  9.0  --   --      Discharge Medications:     Medication List    TAKE these medications        acetaminophen 500 MG tablet  Commonly known as:  TYLENOL  Take 500 mg by mouth every 8 (eight) hours as needed (pain).     BENGAY EX  Apply 1 application topically 2 (two) times daily as needed (leg pain).     CALCIUM PO  Take 500 mg by mouth 2 (two) times daily.     Dextromethorphan-Guaifenesin 10-200 MG Caps  Take 1 tablet by mouth 2 (two) times daily as needed (cold symptoms).     docusate sodium 50 MG capsule   Commonly known as:  COLACE  Take by mouth daily as needed (constipation).     fluticasone 50 MCG/ACT nasal spray  Commonly known as:  FLONASE  Place 2 sprays into the nose daily as needed for allergies (nasal drip).     furosemide 40 MG tablet  Commonly known as:  LASIX  Take 20 mg by mouth as needed for fluid.     HYDROcodone-acetaminophen 5-325 MG per tablet  Commonly known as:  NORCO/VICODIN  Take 1-2 tablets by mouth every 4 (four) hours as needed (breakthrough pain).     ICAPS PO  Take 1 capsule by mouth 2 (two) times daily.     levothyroxine 25 MCG tablet  Commonly known as:  SYNTHROID, LEVOTHROID  Take 25 mcg by mouth daily before breakfast.     losartan 25 MG tablet  Commonly known as:  COZAAR  TAKE 1 TABLET BY MOUTH DAILY     methocarbamol 500 MG tablet  Commonly known as:  ROBAXIN  Take 1 tablet (500 mg total) by mouth every 6 (six) hours as needed for muscle spasms.     metoprolol succinate 100 MG 24 hr tablet  Commonly known as:  TOPROL-XL  TAKE 1 TABLET BY MOUTH EVERY DAY WITH OR IMMEDIATELY FOLLOWING A MEAL     OVER THE COUNTER MEDICATION  Take by mouth daily as needed (nasal drip). Tylenol Cold - Mucous Severe (dextromethorphan, acetaminophen, phenylephrine, guaifenesin) - takes early morning if she wakes up with a nasal drip (1 swig)     VITAMIN D PO  Take 1 tablet by mouth daily.     warfarin 5 MG tablet  Commonly known as:  COUMADIN  Take as directed by anticoagulation clinic        Diagnostic Studies: Dg Chest 2 View  12/03/2014   CLINICAL DATA:  81 year old female presenting for preoperative chest x-ray with a scheduled total abdominal hysterectomy.  EXAM: CHEST - 2 VIEW  COMPARISON:  Chest x-ray 02/07/2009, 02/03/2009, 07/14/2008, chest CT 07/13/2008  FINDINGS: Cardiomediastinal silhouette is relatively unchanged in size and contour from comparison chest x-ray. Dense calcifications of the aortic arch and descending thoracic aorta. Descending  thoracic aorta tortuosity.  No evidence of pulmonary vascular congestion.  Cardiac pacing device on the left chest wall with slight counterclockwise rotation compared to the prior chest x-ray. Right atrial and right ventricular leads are unchanged.  Since the prior chest x-ray, there is more pronounced reticular nodular opacity throughout the lungs. No confluent opacity.  Nodular opacity at the base of the right lung measures 10 mm. Nodular opacity in the right suprahilar region, more pronounced than on the comparison, is in a similar region to nodule identified on the most recent chest CT dated 07/13/2008.  IMPRESSION: No evidence of lobar pneumonia, however, there is increased reticular nodular opacity of the bilateral lungs compared to the prior plain film. This pattern is nonspecific, and includes a differential diagnosis of infectious/inflammatory process, as well as more indolent process such as a lymphangitic carcinoma. Correlation with the patient's prior history, including that of potential prior carcinoma recommended.  Rounded nodule in the right suprahilar region as well as at the right base, as above. Further evaluation with noncontrast CT is recommended.  These results will be called to the ordering clinician or representative by the Radiologist Assistant, and communication documented in the PACS or zVision Dashboard.  Signed,  Dulcy Fanny. Earleen Newport, DO  [facility]  [facility]   Electronically Signed   By: Corrie Mckusick D.O.   On: 12/03/2014 16:17   Dg Hip Operative Unilat With Pelvis Right  12/16/2014   CLINICAL DATA:  Intraoperative imaging, total right hip arthroplasty  EXAM: OPERATIVE right HIP (WITH PELVIS IF PERFORMED) 2 VIEWS  TECHNIQUE: Fluoroscopic spot image(s) were submitted for interpretation post-operatively.  COMPARISON:  No preoperative imaging is available  FINDINGS: Two views demonstrate right total hip arthroplasty without evidence for  hardware failure. No fracture line is identified. Femoral component appears properly located allowing for projections. Left femoral rod partly visualized.  IMPRESSION: Expected intraoperative appearance, right total hip arthroplasty.   Electronically Signed   By: Conchita Paris M.D.   On: 12/16/2014 11:50    Disposition:       Discharge Instructions    Call MD / Call 911    Complete by:  As directed   If you experience chest pain or shortness of breath, CALL 911 and be transported to the hospital emergency room.  If you develope a fever above 101 F, pus (white drainage) or increased drainage or redness at the wound, or calf pain, call your surgeon's office.     Constipation Prevention    Complete by:  As directed   Drink plenty of fluids.  Prune juice may be helpful.  You may use a stool softener, such as Colace (over the counter) 100 mg twice a day.  Use MiraLax (over the counter) for constipation as needed.     Diet - low sodium heart healthy    Complete by:  As directed      Increase activity slowly as tolerated    Complete by:  As directed            Follow-up Information    Follow up with Hessie Dibble, MD. Schedule an appointment as soon as possible for a visit in 2 weeks.   Specialty:  Orthopedic Surgery   Contact information:   [place] [address] [phone]       Follow up with [facility].   Specialty:  Home Health Services   Why:  They will contact you to schedule home therapy visits.   Contact information:   [address] [place] [address] [phone]        Signed: Rich Fuchs 12/19/2014, 7:49 AM

## 2014-12-19 NOTE — Progress Notes (Signed)
CARE MANAGEMENT NOTE 12/19/2014  Patient:  Patricia Holt, Patricia Holt   Account Number:  [id number]  Date Initiated:  12/17/2014  Documentation initiated by:  [facility]  Subjective/Objective Assessment:   s/p right total hip arthroplasty     Action/Plan:   PT/OT evals- recommended HHPT and HHOT then changed recommendation to SNF   Anticipated DC Date:  12/19/2014   Anticipated DC Plan:  SKILLED NURSING FACILITY  In-house referral  Clinical Social Worker      DC Planning Services  CM consult      Choice offered to / List presented to:  C-1 Patient           Status of service:  Completed, signed off Medicare Important Message given?  YES Date Medicare IM given:  12/19/2014 Medicare IM given by:  [facility]  Discharge Disposition:  [place]  Per UR Regulation:  Reviewed for med. necessity/level of care/duration of stay   Comments:  12/19/14 PT/OT changed recommendation to SNF due to issues with patient's shoulder effecting her progress. Patient to discharge to SNF today. Notified Levada Dy at [facility] that patient would be discharged today to [place]. Fuller Plan RN, BSN, CCM  12/17/14 Set up with [facility] for HHPT, RN by MD office. Spoke with patient, no change in discharge plan. Patient stated that she has a rolling walker and 3N1 at home and that her son and daughter in law will be available to assist her after d/c. Contacted Luellen Pucker with [facility], faxed [facility] order, face to face and pharmacy note as requested to [phone]. Received confirmation. Will continue to follow until discharge.

## 2014-12-19 NOTE — Progress Notes (Signed)
Subjective: 3 Days Post-Op Procedure(s) (LRB): RIGHT TOTAL HIP ARTHROPLASTY ANTERIOR APPROACH (Right)  Patients venous doppler was negative yesterday. He pain is a little better. She now mentions that she was having this pain prior to surgery and that it seems a little better today. She is passing gas but no BM yet. She still has urinary incontinence issues during the night.   Activity level:  wbat Diet tolerance:  ok Voiding:  ok Patient reports pain as mild and moderate.    Objective: Vital signs in last 24 hours: Temp:  [98.2 F (36.8 C)-98.7 F (37.1 C)] 98.2 F (36.8 C) (02/19 0445) Pulse Rate:  [102-105] 104 (02/19 0445) Resp:  [16] 16 (02/19 0445) BP: (100-110)/(44-63) 106/58 mmHg (02/19 0445) SpO2:  [94 %-99 %] 94 % (02/19 0445)  Labs:  Recent Labs  12/16/14 1550 12/17/14 0539 12/18/14 0700  HGB 10.9* 9.6* 9.5*    Recent Labs  12/17/14 0539 12/18/14 0700  WBC 6.0 7.6  RBC 3.31* 3.17*  HCT 30.0* 29.0*  PLT 250 236    Recent Labs  12/16/14 1550 12/17/14 0539  NA  --  134*  K  --  4.4  CL  --  99  CO2  --  32  BUN  --  11  CREATININE 0.71 0.69  GLUCOSE  --  119*  CALCIUM  --  9.0    Recent Labs  12/17/14 0539 12/18/14 0700  INR 1.45 2.18*    Physical Exam:  Neurologically intact ABD soft Neurovascular intact Sensation intact distally Intact pulses distally Dorsiflexion/Plantar flexion intact Incision: dressing C/D/I and no drainage No cellulitis present Compartment soft  Assessment/Plan:  3 Days Post-Op Procedure(s) (LRB): RIGHT TOTAL HIP ARTHROPLASTY ANTERIOR APPROACH (Right) Advance diet Up with therapy Discharge to SNF [facility] today.  Continue coumadin for DVT prevention. Follow up in office 2 weeks post op.    Erica Richwine, Larwance Sachs 12/19/2014, 7:45 AM

## 2014-12-19 NOTE — Discharge Planning (Signed)
Patient will discharge today per MD order. Patient will discharge to [facility] RN to call report prior to transportation to:[phone] Transportation:  Son, Marcello Moores- 1pm  CSW sent discharge summary to SNF for review.  Packet is complete.  RN, patient and family aware of discharge plans.   [name], [place] [phone]  Psychiatric & Orthopedics (5N 1-16) Clinical Social Worker

## 2014-12-19 NOTE — Clinical Social Work Placement (Signed)
Clinical Social Work Department CLINICAL SOCIAL WORK PLACEMENT NOTE 12/19/2014  Patient:  Patricia Holt, Patricia Holt  Account Number:  [id number] Admit date:  12/16/2014  Clinical Social Worker:  Wylene Men  Date/time:  12/18/2014 08:01 AM  Clinical Social Work is seeking post-discharge placement for this patient at the following level of care:   SKILLED NURSING   (*CSW will update this form in Epic as items are completed)   12/18/2014  Patient/family provided with [place] Department of Clinical Social Work's list of facilities offering this level of care within the geographic area requested by the patient (or if unable, by the patient's family).  12/18/2014  Patient/family informed of their freedom to choose among providers that offer the needed level of care, that participate in Medicare, Medicaid or managed care program needed by the patient, have an available bed and are willing to accept the patient.  12/18/2014  Patient/family informed of [facility]' ownership interest in [facility], as well as of the fact that they are under no obligation to receive care at this facility.  PASARR submitted to EDS on  PASARR number received on   FL2 transmitted to all facilities in geographic area requested by pt/family on  12/18/2014 FL2 transmitted to all facilities within larger geographic area on   Patient informed that his/her managed care company has contracts with or will negotiate with  certain facilities, including the following:     Patient/family informed of bed offers received:  12/18/2014 Patient chooses bed at [facility] Physician recommends and patient chooses bed at  [facility]  Patient to be transferred to [facility] on  12/19/2014 Patient to be transferred to facility by son, Marcello Moores Gershon Mussel) Patient and family notified of transfer on 12/19/2014 Name of family member notified:  Gershon Mussel  The following physician request were entered in Epic:   Additional  Comments: PASARR existing   [name], [place] [phone]  Psychiatric & Orthopedics (5N 1-16) Clinical Social Worker

## 2014-12-19 NOTE — Progress Notes (Signed)
Physical Therapy Treatment Patient Details Name: DEZARAY SHIBUYA MRN: [id number] DOB: June 18, 1924 Today's Date: 12/19/2014    History of Present Illness pt is an 81 y.o. female s/p Rt THA.     PT Comments    Pt and son requesting PT (A) for car transfer. Educated son on guarding technique and (A) pt to car for D/C.   Follow Up Recommendations  SNF;Supervision/Assistance - 24 hour     Equipment Recommendations  None recommended by PT    Recommendations for Other Services       Precautions / Restrictions Precautions Precautions: Fall Restrictions Weight Bearing Restrictions: Yes RLE Weight Bearing: Weight bearing as tolerated    Mobility  Bed Mobility                  Transfers Overall transfer level: Needs assistance Equipment used: Rolling walker (2 wheeled) Transfers: Sit to/from Stand Sit to Stand: Min assist         General transfer comment: min (A) to maintain balance when transitioning UEs to RW; pt with heavy posterior lean  Ambulation/Gait Ambulation/Gait assistance: Min guard Ambulation Distance (Feet): 10 Feet Assistive device: Rolling walker (2 wheeled) Gait Pattern/deviations: Step-through pattern;Decreased stride length;Narrow base of support;Trunk flexed Gait velocity: decr Gait velocity interpretation: Below normal speed for age/gender General Gait Details: short distance to transfer to w/c and to car; cues for technique    Stairs            Wheelchair Mobility    Modified Rankin (Stroke Patients Only)       Balance Overall balance assessment: Needs assistance                                  Cognition Arousal/Alertness: Awake/alert Behavior During Therapy: WFL for tasks assessed/performed Overall Cognitive Status: Within Functional Limits for tasks assessed                      Exercises      General Comments General comments (skin integrity, edema, etc.): son requesting PT (A) with car  transfer       Pertinent Vitals/Pain Pain Assessment: Faces Faces Pain Scale: Hurts even more Pain Location: Rt hip with movement  Pain Descriptors / Indicators: Grimacing Pain Intervention(s): Monitored during session    Home Living                      Prior Function            PT Goals (current goals can now be found in the care plan section) Acute Rehab PT Goals Patient Stated Goal: to get in the car PT Goal Formulation: With patient Time For Goal Achievement: 12/24/14 Potential to Achieve Goals: Good Progress towards PT goals: Progressing toward goals    Frequency  7X/week    PT Plan Current plan remains appropriate    Co-evaluation             End of Session   Activity Tolerance: Patient tolerated treatment well Patient left: Other (comment) (in car for D/C)     Time: 8676-1950 PT Time Calculation (min) (ACUTE ONLY): 16 min  Charges:  $Therapeutic Activity: 8-22 mins                    G CodesGustavus Bryant, [place]  [phone] 12/19/2014, 4:25 PM

## 2014-12-24 ENCOUNTER — Non-Acute Institutional Stay (SKILLED_NURSING_FACILITY): Payer: Medicare Other | Admitting: Internal Medicine

## 2014-12-24 DIAGNOSIS — E039 Hypothyroidism, unspecified: Secondary | ICD-10-CM

## 2014-12-24 DIAGNOSIS — I10 Essential (primary) hypertension: Secondary | ICD-10-CM

## 2014-12-24 DIAGNOSIS — K5901 Slow transit constipation: Secondary | ICD-10-CM

## 2014-12-24 DIAGNOSIS — D62 Acute posthemorrhagic anemia: Secondary | ICD-10-CM | POA: Diagnosis not present

## 2014-12-24 DIAGNOSIS — M1611 Unilateral primary osteoarthritis, right hip: Secondary | ICD-10-CM | POA: Diagnosis not present

## 2014-12-24 DIAGNOSIS — I480 Paroxysmal atrial fibrillation: Secondary | ICD-10-CM | POA: Diagnosis not present

## 2014-12-29 DIAGNOSIS — Z9889 Other specified postprocedural states: Secondary | ICD-10-CM | POA: Diagnosis not present

## 2014-12-30 DIAGNOSIS — Z950 Presence of cardiac pacemaker: Secondary | ICD-10-CM | POA: Diagnosis not present

## 2014-12-30 DIAGNOSIS — M1611 Unilateral primary osteoarthritis, right hip: Secondary | ICD-10-CM | POA: Diagnosis not present

## 2014-12-30 DIAGNOSIS — K5900 Constipation, unspecified: Secondary | ICD-10-CM | POA: Diagnosis not present

## 2014-12-30 DIAGNOSIS — R2681 Unsteadiness on feet: Secondary | ICD-10-CM | POA: Diagnosis not present

## 2014-12-30 DIAGNOSIS — M6281 Muscle weakness (generalized): Secondary | ICD-10-CM | POA: Diagnosis not present

## 2014-12-30 DIAGNOSIS — R609 Edema, unspecified: Secondary | ICD-10-CM | POA: Diagnosis not present

## 2014-12-30 DIAGNOSIS — Z96641 Presence of right artificial hip joint: Secondary | ICD-10-CM | POA: Diagnosis not present

## 2014-12-30 DIAGNOSIS — I4891 Unspecified atrial fibrillation: Secondary | ICD-10-CM | POA: Diagnosis not present

## 2014-12-30 DIAGNOSIS — I10 Essential (primary) hypertension: Secondary | ICD-10-CM | POA: Diagnosis not present

## 2014-12-30 DIAGNOSIS — E039 Hypothyroidism, unspecified: Secondary | ICD-10-CM | POA: Diagnosis not present

## 2014-12-30 DIAGNOSIS — D62 Acute posthemorrhagic anemia: Secondary | ICD-10-CM | POA: Diagnosis not present

## 2014-12-30 DIAGNOSIS — I480 Paroxysmal atrial fibrillation: Secondary | ICD-10-CM | POA: Diagnosis not present

## 2014-12-30 DIAGNOSIS — M8088XS Other osteoporosis with current pathological fracture, vertebra(e), sequela: Secondary | ICD-10-CM | POA: Diagnosis not present

## 2014-12-30 DIAGNOSIS — J309 Allergic rhinitis, unspecified: Secondary | ICD-10-CM | POA: Diagnosis not present

## 2014-12-30 DIAGNOSIS — Z7901 Long term (current) use of anticoagulants: Secondary | ICD-10-CM | POA: Diagnosis not present

## 2014-12-30 DIAGNOSIS — Z471 Aftercare following joint replacement surgery: Secondary | ICD-10-CM | POA: Diagnosis not present

## 2014-12-30 DIAGNOSIS — R278 Other lack of coordination: Secondary | ICD-10-CM | POA: Diagnosis not present

## 2014-12-30 DIAGNOSIS — E785 Hyperlipidemia, unspecified: Secondary | ICD-10-CM | POA: Diagnosis not present

## 2014-12-31 ENCOUNTER — Non-Acute Institutional Stay (SKILLED_NURSING_FACILITY): Payer: Medicare Other | Admitting: Adult Health

## 2014-12-31 ENCOUNTER — Encounter: Payer: Self-pay | Admitting: Adult Health

## 2014-12-31 DIAGNOSIS — E039 Hypothyroidism, unspecified: Secondary | ICD-10-CM | POA: Diagnosis not present

## 2014-12-31 DIAGNOSIS — Z7901 Long term (current) use of anticoagulants: Secondary | ICD-10-CM

## 2014-12-31 DIAGNOSIS — K5900 Constipation, unspecified: Secondary | ICD-10-CM

## 2014-12-31 DIAGNOSIS — M1611 Unilateral primary osteoarthritis, right hip: Secondary | ICD-10-CM

## 2014-12-31 DIAGNOSIS — R609 Edema, unspecified: Secondary | ICD-10-CM

## 2014-12-31 DIAGNOSIS — I480 Paroxysmal atrial fibrillation: Secondary | ICD-10-CM

## 2014-12-31 DIAGNOSIS — D62 Acute posthemorrhagic anemia: Secondary | ICD-10-CM

## 2014-12-31 DIAGNOSIS — I10 Essential (primary) hypertension: Secondary | ICD-10-CM

## 2014-12-31 DIAGNOSIS — J309 Allergic rhinitis, unspecified: Secondary | ICD-10-CM | POA: Diagnosis not present

## 2014-12-31 NOTE — Progress Notes (Signed)
Patient ID: Patricia Holt, female   DOB: 1923/12/03, 81 y.o.   MRN: [id number]   12/31/2014  Facility:  Nursing Home Location:  [place] Room Number: 902-P LEVEL OF CARE:  SNF (31)   Chief Complaint  Patient presents with  . Discharge Note    Osteoarthritis S/P Right total hip arthroplasty, hypothyroidism, hypertension, edema, allergic rhinitis, constipation and atrial fibrillation    HISTORY OF PRESENT ILLNESS:  This is an 81 year old female who is for discharge home with home health PT for endurance. She has past medical history of atrial fibrillation, hypertension and hypothyroidism. She has been admitted to [facility] on 12/19/14 from [facility] with osteoarthritis S/P  right total hip arthroplasty. Patient was admitted to this facility for short-term rehabilitation after the patient's recent hospitalization.  Patient has completed SNF rehabilitation and therapy has cleared the patient for discharge.  PAST MEDICAL HISTORY:  Past Medical History  Diagnosis Date  . HTN (hypertension)   . Hyperlipidemia   . Hypothyroidism   . Sinoatrial node dysfunction   . Aortic valve disorder   . Osteoporosis     , severe, hypercalciuria, Boniva since 2005, T10 compression fracture 2009  . Goiter   . Polymyalgia rheumatica   . Carpal tunnel syndrome   . Other disorder of calcium metabolism   . Macular degeneration   . DJD (degenerative joint disease)   . Atrial fibrillation     paroxysmal, status post successful vessel cardioversion 5/09, skains,transient left ventricular systolic dysfunction, likely secondary to cardioversion/stunning of the myocardium, no evidence significant CAD, echo 7/09 shows normal ejection fraction  . Mitral regurgitation   . Chronic anticoagulation   . Presence of permanent cardiac pacemaker   . Dysrhythmia   . Sinus drainage     in the morning, clear drainage  . GERD (gastroesophageal reflux disease)     OTC  occasionally    CURRENT MEDICATIONS: Reviewed per MAR/see medication list  No Known Allergies   REVIEW OF SYSTEMS:  GENERAL: no change in appetite, no fatigue, no weight changes, no fever, chills or weakness RESPIRATORY: no cough, SOB, DOE, wheezing, hemoptysis CARDIAC: no chest pain, or palpitations GI: no abdominal pain, diarrhea, constipation, heart burn, nausea or vomiting  PHYSICAL EXAMINATION  GENERAL: no acute distress, normal body habitus EYES: conjunctivae normal, sclerae normal, normal eye lids NECK: supple, trachea midline, no neck masses, no thyroid tenderness, no thyromegaly LYMPHATICS: no LAN in the neck, no supraclavicular LAN RESPIRATORY: breathing is even & unlabored, BS CTAB CARDIAC: Rate controlled, no murmur,no extra heart sounds, RLE edema 1+, +pacemaker on left chest GI: abdomen soft, normal BS, no masses, no tenderness, no hepatomegaly, no splenomegaly EXTREMITIES: Able to move 4 extremities PSYCHIATRIC: the patient is alert & oriented to person, affect & behavior appropriate  LABS/RADIOLOGY: Labs reviewed: Basic Metabolic Panel:  Recent Labs  12/03/14 1409 12/16/14 1550 12/17/14 0539  NA 138  --  134*  K 4.3  --  4.4  CL 101  --  99  CO2 28  --  32  GLUCOSE 79  --  119*  BUN 13  --  11  CREATININE 0.70 0.71 0.69  CALCIUM 10.0  --  9.0   CBC:  Recent Labs  12/03/14 1409  12/17/14 0539 12/18/14 0700 12/19/14 0646  WBC 7.8  < > 6.0 7.6 8.3  NEUTROABS 5.5  --   --   --   --   HGB 12.4  < >  9.6* 9.5* 9.5*  HCT 38.3  < > 30.0* 29.0* 29.4*  MCV 90.1  < > 90.6 91.5 89.4  PLT 363  < > 250 236 315  < > = values in this interval not displayed.   Dg Chest 2 View  12/03/2014   CLINICAL DATA:  81 year old female presenting for preoperative chest x-ray with a scheduled total abdominal hysterectomy.  EXAM: CHEST - 2 VIEW  COMPARISON:  Chest x-ray 02/07/2009, 02/03/2009, 07/14/2008, chest CT 07/13/2008  FINDINGS: Cardiomediastinal silhouette is  relatively unchanged in size and contour from comparison chest x-ray. Dense calcifications of the aortic arch and descending thoracic aorta. Descending thoracic aorta tortuosity.  No evidence of pulmonary vascular congestion.  Cardiac pacing device on the left chest wall with slight counterclockwise rotation compared to the prior chest x-ray. Right atrial and right ventricular leads are unchanged.  Since the prior chest x-ray, there is more pronounced reticular nodular opacity throughout the lungs. No confluent opacity.  Nodular opacity at the base of the right lung measures 10 mm. Nodular opacity in the right suprahilar region, more pronounced than on the comparison, is in a similar region to nodule identified on the most recent chest CT dated 07/13/2008.  IMPRESSION: No evidence of lobar pneumonia, however, there is increased reticular nodular opacity of the bilateral lungs compared to the prior plain film. This pattern is nonspecific, and includes a differential diagnosis of infectious/inflammatory process, as well as more indolent process such as a lymphangitic carcinoma. Correlation with the patient's prior history, including that of potential prior carcinoma recommended.  Rounded nodule in the right suprahilar region as well as at the right base, as above. Further evaluation with noncontrast CT is recommended.  These results will be called to the ordering clinician or representative by the Radiologist Assistant, and communication documented in the PACS or zVision Dashboard.  Signed,  Dulcy Fanny. Earleen Newport, DO  [facility]  [facility]   Electronically Signed   By: Corrie Mckusick D.O.   On: 12/03/2014 16:17   Dg Hip Operative Unilat With Pelvis Right  12/16/2014   CLINICAL DATA:  Intraoperative imaging, total right hip arthroplasty  EXAM: OPERATIVE right HIP (WITH PELVIS IF PERFORMED) 2 VIEWS  TECHNIQUE: Fluoroscopic spot image(s) were submitted for interpretation  post-operatively.  COMPARISON:  No preoperative imaging is available  FINDINGS: Two views demonstrate right total hip arthroplasty without evidence for hardware failure. No fracture line is identified. Femoral component appears properly located allowing for projections. Left femoral rod partly visualized.  IMPRESSION: Expected intraoperative appearance, right total hip arthroplasty.   Electronically Signed   By: Conchita Paris M.D.   On: 12/16/2014 11:50    ASSESSMENT/PLAN:  Osteoarthritis S/P right total hip arthroplasty - for Home health PT; continue Norco 5/325 mg 1-2 tabs by mouth every 4 hours when necessary for pain; Robaxin 500 mg 1 tab by mouth every 6 hours when necessary for muscle spasm Hypothyroidism - continue levothyroxine 25 g by mouth daily Hypertension - well controlled; continue Cozaar and 5 mg by mouth daily and metoprolol 100 mg by mouth daily Atrial fibrillation - rate controlled; continue metoprolol 100 mg by mouth daily Allergic rhinitis - continue Flonase nasal spray when necessary Edema - continue Lasix when necessary Long-term use of anticoagulant - INR  4.0 - supratherapeutic; hold Coumadin today and recheck INR on 01/01/15 Anemia, acute blood loss - hgb 9.5; stable  Constipation - continue Miralax and Colace  I have filled out patient's discharge paperwork  and written prescriptions.  Patient will receive home health PT.  Total discharge time: Greater than 30 minutes  Discharge time involved coordination of the discharge process with social worker, nursing staff and therapy department. Medical justification for home health services verified.  [facility], NP [employer] [phone]

## 2015-01-02 DIAGNOSIS — G5600 Carpal tunnel syndrome, unspecified upper limb: Secondary | ICD-10-CM | POA: Diagnosis not present

## 2015-01-02 DIAGNOSIS — M1990 Unspecified osteoarthritis, unspecified site: Secondary | ICD-10-CM | POA: Diagnosis not present

## 2015-01-02 DIAGNOSIS — M353 Polymyalgia rheumatica: Secondary | ICD-10-CM | POA: Diagnosis not present

## 2015-01-02 DIAGNOSIS — M8088XS Other osteoporosis with current pathological fracture, vertebra(e), sequela: Secondary | ICD-10-CM | POA: Diagnosis not present

## 2015-01-02 DIAGNOSIS — I4891 Unspecified atrial fibrillation: Secondary | ICD-10-CM | POA: Diagnosis not present

## 2015-01-02 DIAGNOSIS — Z471 Aftercare following joint replacement surgery: Secondary | ICD-10-CM | POA: Diagnosis not present

## 2015-01-03 DIAGNOSIS — Z471 Aftercare following joint replacement surgery: Secondary | ICD-10-CM | POA: Diagnosis not present

## 2015-01-03 DIAGNOSIS — M353 Polymyalgia rheumatica: Secondary | ICD-10-CM | POA: Diagnosis not present

## 2015-01-03 DIAGNOSIS — I4891 Unspecified atrial fibrillation: Secondary | ICD-10-CM | POA: Diagnosis not present

## 2015-01-03 DIAGNOSIS — G5600 Carpal tunnel syndrome, unspecified upper limb: Secondary | ICD-10-CM | POA: Diagnosis not present

## 2015-01-03 DIAGNOSIS — M1990 Unspecified osteoarthritis, unspecified site: Secondary | ICD-10-CM | POA: Diagnosis not present

## 2015-01-03 DIAGNOSIS — M8088XS Other osteoporosis with current pathological fracture, vertebra(e), sequela: Secondary | ICD-10-CM | POA: Diagnosis not present

## 2015-01-04 DIAGNOSIS — M8088XS Other osteoporosis with current pathological fracture, vertebra(e), sequela: Secondary | ICD-10-CM | POA: Diagnosis not present

## 2015-01-04 DIAGNOSIS — M1990 Unspecified osteoarthritis, unspecified site: Secondary | ICD-10-CM | POA: Diagnosis not present

## 2015-01-04 DIAGNOSIS — I4891 Unspecified atrial fibrillation: Secondary | ICD-10-CM | POA: Diagnosis not present

## 2015-01-04 DIAGNOSIS — Z471 Aftercare following joint replacement surgery: Secondary | ICD-10-CM | POA: Diagnosis not present

## 2015-01-04 DIAGNOSIS — G5600 Carpal tunnel syndrome, unspecified upper limb: Secondary | ICD-10-CM | POA: Diagnosis not present

## 2015-01-04 DIAGNOSIS — M353 Polymyalgia rheumatica: Secondary | ICD-10-CM | POA: Diagnosis not present

## 2015-01-06 DIAGNOSIS — M8088XS Other osteoporosis with current pathological fracture, vertebra(e), sequela: Secondary | ICD-10-CM | POA: Diagnosis not present

## 2015-01-06 DIAGNOSIS — M1990 Unspecified osteoarthritis, unspecified site: Secondary | ICD-10-CM | POA: Diagnosis not present

## 2015-01-06 DIAGNOSIS — Z471 Aftercare following joint replacement surgery: Secondary | ICD-10-CM | POA: Diagnosis not present

## 2015-01-06 DIAGNOSIS — M353 Polymyalgia rheumatica: Secondary | ICD-10-CM | POA: Diagnosis not present

## 2015-01-06 DIAGNOSIS — I4891 Unspecified atrial fibrillation: Secondary | ICD-10-CM | POA: Diagnosis not present

## 2015-01-06 DIAGNOSIS — G5600 Carpal tunnel syndrome, unspecified upper limb: Secondary | ICD-10-CM | POA: Diagnosis not present

## 2015-01-08 ENCOUNTER — Ambulatory Visit (INDEPENDENT_AMBULATORY_CARE_PROVIDER_SITE_OTHER): Payer: PRIVATE HEALTH INSURANCE | Admitting: Internal Medicine

## 2015-01-08 DIAGNOSIS — G5600 Carpal tunnel syndrome, unspecified upper limb: Secondary | ICD-10-CM | POA: Diagnosis not present

## 2015-01-08 DIAGNOSIS — M8088XS Other osteoporosis with current pathological fracture, vertebra(e), sequela: Secondary | ICD-10-CM | POA: Diagnosis not present

## 2015-01-08 DIAGNOSIS — I4891 Unspecified atrial fibrillation: Secondary | ICD-10-CM | POA: Diagnosis not present

## 2015-01-08 DIAGNOSIS — M1990 Unspecified osteoarthritis, unspecified site: Secondary | ICD-10-CM | POA: Diagnosis not present

## 2015-01-08 DIAGNOSIS — M353 Polymyalgia rheumatica: Secondary | ICD-10-CM | POA: Diagnosis not present

## 2015-01-08 DIAGNOSIS — I480 Paroxysmal atrial fibrillation: Secondary | ICD-10-CM

## 2015-01-08 DIAGNOSIS — Z5181 Encounter for therapeutic drug level monitoring: Secondary | ICD-10-CM

## 2015-01-08 DIAGNOSIS — Z471 Aftercare following joint replacement surgery: Secondary | ICD-10-CM | POA: Diagnosis not present

## 2015-01-08 LAB — POCT INR: INR: 3.8

## 2015-01-10 DIAGNOSIS — M8088XS Other osteoporosis with current pathological fracture, vertebra(e), sequela: Secondary | ICD-10-CM | POA: Diagnosis not present

## 2015-01-10 DIAGNOSIS — M353 Polymyalgia rheumatica: Secondary | ICD-10-CM | POA: Diagnosis not present

## 2015-01-10 DIAGNOSIS — Z471 Aftercare following joint replacement surgery: Secondary | ICD-10-CM | POA: Diagnosis not present

## 2015-01-10 DIAGNOSIS — M1990 Unspecified osteoarthritis, unspecified site: Secondary | ICD-10-CM | POA: Diagnosis not present

## 2015-01-10 DIAGNOSIS — G5600 Carpal tunnel syndrome, unspecified upper limb: Secondary | ICD-10-CM | POA: Diagnosis not present

## 2015-01-10 DIAGNOSIS — I4891 Unspecified atrial fibrillation: Secondary | ICD-10-CM | POA: Diagnosis not present

## 2015-01-12 DIAGNOSIS — M25411 Effusion, right shoulder: Secondary | ICD-10-CM | POA: Diagnosis not present

## 2015-01-12 DIAGNOSIS — M129 Arthropathy, unspecified: Secondary | ICD-10-CM | POA: Diagnosis not present

## 2015-01-13 DIAGNOSIS — M1990 Unspecified osteoarthritis, unspecified site: Secondary | ICD-10-CM | POA: Diagnosis not present

## 2015-01-13 DIAGNOSIS — G5600 Carpal tunnel syndrome, unspecified upper limb: Secondary | ICD-10-CM | POA: Diagnosis not present

## 2015-01-13 DIAGNOSIS — M8088XS Other osteoporosis with current pathological fracture, vertebra(e), sequela: Secondary | ICD-10-CM | POA: Diagnosis not present

## 2015-01-13 DIAGNOSIS — Z471 Aftercare following joint replacement surgery: Secondary | ICD-10-CM | POA: Diagnosis not present

## 2015-01-13 DIAGNOSIS — M353 Polymyalgia rheumatica: Secondary | ICD-10-CM | POA: Diagnosis not present

## 2015-01-13 DIAGNOSIS — I4891 Unspecified atrial fibrillation: Secondary | ICD-10-CM | POA: Diagnosis not present

## 2015-01-15 ENCOUNTER — Ambulatory Visit (INDEPENDENT_AMBULATORY_CARE_PROVIDER_SITE_OTHER): Payer: PRIVATE HEALTH INSURANCE | Admitting: Cardiology

## 2015-01-15 DIAGNOSIS — M353 Polymyalgia rheumatica: Secondary | ICD-10-CM | POA: Diagnosis not present

## 2015-01-15 DIAGNOSIS — M1990 Unspecified osteoarthritis, unspecified site: Secondary | ICD-10-CM | POA: Diagnosis not present

## 2015-01-15 DIAGNOSIS — G5600 Carpal tunnel syndrome, unspecified upper limb: Secondary | ICD-10-CM | POA: Diagnosis not present

## 2015-01-15 DIAGNOSIS — I480 Paroxysmal atrial fibrillation: Secondary | ICD-10-CM

## 2015-01-15 DIAGNOSIS — Z471 Aftercare following joint replacement surgery: Secondary | ICD-10-CM | POA: Diagnosis not present

## 2015-01-15 DIAGNOSIS — M8088XS Other osteoporosis with current pathological fracture, vertebra(e), sequela: Secondary | ICD-10-CM | POA: Diagnosis not present

## 2015-01-15 DIAGNOSIS — I4891 Unspecified atrial fibrillation: Secondary | ICD-10-CM | POA: Diagnosis not present

## 2015-01-15 DIAGNOSIS — Z5181 Encounter for therapeutic drug level monitoring: Secondary | ICD-10-CM

## 2015-01-15 LAB — POCT INR: INR: 3.3

## 2015-01-16 DIAGNOSIS — Z471 Aftercare following joint replacement surgery: Secondary | ICD-10-CM | POA: Diagnosis not present

## 2015-01-16 DIAGNOSIS — M8088XS Other osteoporosis with current pathological fracture, vertebra(e), sequela: Secondary | ICD-10-CM | POA: Diagnosis not present

## 2015-01-16 DIAGNOSIS — M1990 Unspecified osteoarthritis, unspecified site: Secondary | ICD-10-CM | POA: Diagnosis not present

## 2015-01-16 DIAGNOSIS — I4891 Unspecified atrial fibrillation: Secondary | ICD-10-CM | POA: Diagnosis not present

## 2015-01-16 DIAGNOSIS — M353 Polymyalgia rheumatica: Secondary | ICD-10-CM | POA: Diagnosis not present

## 2015-01-16 DIAGNOSIS — G5600 Carpal tunnel syndrome, unspecified upper limb: Secondary | ICD-10-CM | POA: Diagnosis not present

## 2015-01-19 DIAGNOSIS — M25551 Pain in right hip: Secondary | ICD-10-CM | POA: Diagnosis not present

## 2015-01-19 DIAGNOSIS — Z96641 Presence of right artificial hip joint: Secondary | ICD-10-CM | POA: Diagnosis not present

## 2015-01-19 DIAGNOSIS — Z9889 Other specified postprocedural states: Secondary | ICD-10-CM | POA: Diagnosis not present

## 2015-01-19 DIAGNOSIS — M6281 Muscle weakness (generalized): Secondary | ICD-10-CM | POA: Diagnosis not present

## 2015-01-19 DIAGNOSIS — R262 Difficulty in walking, not elsewhere classified: Secondary | ICD-10-CM | POA: Diagnosis not present

## 2015-01-21 DIAGNOSIS — M25551 Pain in right hip: Secondary | ICD-10-CM | POA: Diagnosis not present

## 2015-01-21 DIAGNOSIS — R262 Difficulty in walking, not elsewhere classified: Secondary | ICD-10-CM | POA: Diagnosis not present

## 2015-01-21 DIAGNOSIS — M6281 Muscle weakness (generalized): Secondary | ICD-10-CM | POA: Diagnosis not present

## 2015-01-21 DIAGNOSIS — Z96641 Presence of right artificial hip joint: Secondary | ICD-10-CM | POA: Diagnosis not present

## 2015-01-26 DIAGNOSIS — M6281 Muscle weakness (generalized): Secondary | ICD-10-CM | POA: Diagnosis not present

## 2015-01-26 DIAGNOSIS — M25551 Pain in right hip: Secondary | ICD-10-CM | POA: Diagnosis not present

## 2015-01-26 DIAGNOSIS — R262 Difficulty in walking, not elsewhere classified: Secondary | ICD-10-CM | POA: Diagnosis not present

## 2015-01-26 DIAGNOSIS — Z96641 Presence of right artificial hip joint: Secondary | ICD-10-CM | POA: Diagnosis not present

## 2015-01-28 ENCOUNTER — Ambulatory Visit (INDEPENDENT_AMBULATORY_CARE_PROVIDER_SITE_OTHER): Payer: Medicare Other | Admitting: *Deleted

## 2015-01-28 DIAGNOSIS — Z7901 Long term (current) use of anticoagulants: Secondary | ICD-10-CM

## 2015-01-28 DIAGNOSIS — Z5181 Encounter for therapeutic drug level monitoring: Secondary | ICD-10-CM

## 2015-01-28 DIAGNOSIS — I4891 Unspecified atrial fibrillation: Secondary | ICD-10-CM

## 2015-01-28 DIAGNOSIS — M25551 Pain in right hip: Secondary | ICD-10-CM | POA: Diagnosis not present

## 2015-01-28 DIAGNOSIS — M6281 Muscle weakness (generalized): Secondary | ICD-10-CM | POA: Diagnosis not present

## 2015-01-28 DIAGNOSIS — Z96641 Presence of right artificial hip joint: Secondary | ICD-10-CM | POA: Diagnosis not present

## 2015-01-28 DIAGNOSIS — I480 Paroxysmal atrial fibrillation: Secondary | ICD-10-CM

## 2015-01-28 DIAGNOSIS — R262 Difficulty in walking, not elsewhere classified: Secondary | ICD-10-CM | POA: Diagnosis not present

## 2015-01-28 LAB — POCT INR: INR: 2.2

## 2015-01-28 MED ORDER — WARFARIN SODIUM 4 MG PO TABS: ORAL_TABLET | ORAL | Status: DC

## 2015-02-02 DIAGNOSIS — Z96641 Presence of right artificial hip joint: Secondary | ICD-10-CM | POA: Diagnosis not present

## 2015-02-02 DIAGNOSIS — R262 Difficulty in walking, not elsewhere classified: Secondary | ICD-10-CM | POA: Diagnosis not present

## 2015-02-02 DIAGNOSIS — M6281 Muscle weakness (generalized): Secondary | ICD-10-CM | POA: Diagnosis not present

## 2015-02-02 DIAGNOSIS — M25551 Pain in right hip: Secondary | ICD-10-CM | POA: Diagnosis not present

## 2015-02-04 DIAGNOSIS — M6281 Muscle weakness (generalized): Secondary | ICD-10-CM | POA: Diagnosis not present

## 2015-02-04 DIAGNOSIS — Z96641 Presence of right artificial hip joint: Secondary | ICD-10-CM | POA: Diagnosis not present

## 2015-02-04 DIAGNOSIS — R262 Difficulty in walking, not elsewhere classified: Secondary | ICD-10-CM | POA: Diagnosis not present

## 2015-02-04 DIAGNOSIS — M25551 Pain in right hip: Secondary | ICD-10-CM | POA: Diagnosis not present

## 2015-02-09 DIAGNOSIS — Z96641 Presence of right artificial hip joint: Secondary | ICD-10-CM | POA: Diagnosis not present

## 2015-02-09 DIAGNOSIS — R262 Difficulty in walking, not elsewhere classified: Secondary | ICD-10-CM | POA: Diagnosis not present

## 2015-02-09 DIAGNOSIS — M6281 Muscle weakness (generalized): Secondary | ICD-10-CM | POA: Diagnosis not present

## 2015-02-09 DIAGNOSIS — M25551 Pain in right hip: Secondary | ICD-10-CM | POA: Diagnosis not present

## 2015-02-11 ENCOUNTER — Other Ambulatory Visit: Payer: Self-pay | Admitting: *Deleted

## 2015-02-11 ENCOUNTER — Ambulatory Visit (INDEPENDENT_AMBULATORY_CARE_PROVIDER_SITE_OTHER): Payer: Medicare Other | Admitting: *Deleted

## 2015-02-11 ENCOUNTER — Other Ambulatory Visit: Payer: Self-pay | Admitting: Cardiology

## 2015-02-11 DIAGNOSIS — Z7901 Long term (current) use of anticoagulants: Secondary | ICD-10-CM

## 2015-02-11 DIAGNOSIS — Z5181 Encounter for therapeutic drug level monitoring: Secondary | ICD-10-CM

## 2015-02-11 DIAGNOSIS — I480 Paroxysmal atrial fibrillation: Secondary | ICD-10-CM | POA: Diagnosis not present

## 2015-02-11 DIAGNOSIS — M25411 Effusion, right shoulder: Secondary | ICD-10-CM | POA: Diagnosis not present

## 2015-02-11 DIAGNOSIS — R262 Difficulty in walking, not elsewhere classified: Secondary | ICD-10-CM | POA: Diagnosis not present

## 2015-02-11 DIAGNOSIS — I4891 Unspecified atrial fibrillation: Secondary | ICD-10-CM | POA: Diagnosis not present

## 2015-02-11 DIAGNOSIS — M6281 Muscle weakness (generalized): Secondary | ICD-10-CM | POA: Diagnosis not present

## 2015-02-11 DIAGNOSIS — M25551 Pain in right hip: Secondary | ICD-10-CM | POA: Diagnosis not present

## 2015-02-11 DIAGNOSIS — Z96641 Presence of right artificial hip joint: Secondary | ICD-10-CM | POA: Diagnosis not present

## 2015-02-11 LAB — POCT INR: INR: 1.8

## 2015-02-16 DIAGNOSIS — M6281 Muscle weakness (generalized): Secondary | ICD-10-CM | POA: Diagnosis not present

## 2015-02-16 DIAGNOSIS — Z96641 Presence of right artificial hip joint: Secondary | ICD-10-CM | POA: Diagnosis not present

## 2015-02-16 DIAGNOSIS — R262 Difficulty in walking, not elsewhere classified: Secondary | ICD-10-CM | POA: Diagnosis not present

## 2015-02-16 DIAGNOSIS — M25551 Pain in right hip: Secondary | ICD-10-CM | POA: Diagnosis not present

## 2015-02-18 ENCOUNTER — Other Ambulatory Visit: Payer: Self-pay | Admitting: Adult Health

## 2015-02-19 DIAGNOSIS — M6281 Muscle weakness (generalized): Secondary | ICD-10-CM | POA: Diagnosis not present

## 2015-02-19 DIAGNOSIS — M25551 Pain in right hip: Secondary | ICD-10-CM | POA: Diagnosis not present

## 2015-02-19 DIAGNOSIS — R262 Difficulty in walking, not elsewhere classified: Secondary | ICD-10-CM | POA: Diagnosis not present

## 2015-02-19 DIAGNOSIS — Z96641 Presence of right artificial hip joint: Secondary | ICD-10-CM | POA: Diagnosis not present

## 2015-02-25 ENCOUNTER — Ambulatory Visit (INDEPENDENT_AMBULATORY_CARE_PROVIDER_SITE_OTHER): Payer: Medicare Other | Admitting: Surgery

## 2015-02-25 DIAGNOSIS — Z7901 Long term (current) use of anticoagulants: Secondary | ICD-10-CM

## 2015-02-25 DIAGNOSIS — H3532 Exudative age-related macular degeneration: Secondary | ICD-10-CM | POA: Diagnosis not present

## 2015-02-25 DIAGNOSIS — Z5181 Encounter for therapeutic drug level monitoring: Secondary | ICD-10-CM | POA: Diagnosis not present

## 2015-02-25 DIAGNOSIS — H43813 Vitreous degeneration, bilateral: Secondary | ICD-10-CM | POA: Diagnosis not present

## 2015-02-25 DIAGNOSIS — I4891 Unspecified atrial fibrillation: Secondary | ICD-10-CM | POA: Diagnosis not present

## 2015-02-25 DIAGNOSIS — I480 Paroxysmal atrial fibrillation: Secondary | ICD-10-CM

## 2015-02-25 LAB — POCT INR: INR: 1.6

## 2015-03-02 DIAGNOSIS — M6281 Muscle weakness (generalized): Secondary | ICD-10-CM | POA: Diagnosis not present

## 2015-03-02 DIAGNOSIS — M25551 Pain in right hip: Secondary | ICD-10-CM | POA: Diagnosis not present

## 2015-03-02 DIAGNOSIS — R262 Difficulty in walking, not elsewhere classified: Secondary | ICD-10-CM | POA: Diagnosis not present

## 2015-03-02 DIAGNOSIS — Z96641 Presence of right artificial hip joint: Secondary | ICD-10-CM | POA: Diagnosis not present

## 2015-03-04 DIAGNOSIS — M25551 Pain in right hip: Secondary | ICD-10-CM | POA: Diagnosis not present

## 2015-03-04 DIAGNOSIS — M6281 Muscle weakness (generalized): Secondary | ICD-10-CM | POA: Diagnosis not present

## 2015-03-04 DIAGNOSIS — Z96641 Presence of right artificial hip joint: Secondary | ICD-10-CM | POA: Diagnosis not present

## 2015-03-04 DIAGNOSIS — R262 Difficulty in walking, not elsewhere classified: Secondary | ICD-10-CM | POA: Diagnosis not present

## 2015-03-08 NOTE — Progress Notes (Signed)
Patient ID: Patricia Holt, female   DOB: 07-01-1924, 81 y.o.   MRN: [id number]    [facility]  Chief Complaint  Patient presents with  . New Admit To SNF   No Known Allergies   HPI 81 y/o female patient is here for STR post hospital admission from 12-16-14 to 12-19-14 for right hip OA. She underwent right hip arthroplasty. Her pain is under control. Denies any concerns.  She has pmh of afib, HTN, OA among others.  Review of Systems  Constitutional: Negative for fever, chills, malaise/fatigue and diaphoresis.  HENT: Negative for congestion, hearing loss and sore throat.   Eyes: Negative for blurred vision, double vision and discharge.  Respiratory: Negative for cough, sputum production, shortness of breath and wheezing.   Cardiovascular: Negative for chest pain, palpitations, leg swelling.   Gastrointestinal: Negative for heartburn, nausea, vomiting, abdominal pain. Has been constipated  Genitourinary: Negative for dysuria.  Musculoskeletal: Negative for back pain, falls Skin: Negative for itching and rash.  Neurological: Negative for dizziness and headaches.  Psychiatric/Behavioral: Negative for depression  Past Medical History  Diagnosis Date  . HTN (hypertension)   . Hyperlipidemia   . Hypothyroidism   . Sinoatrial node dysfunction   . Aortic valve disorder   . Osteoporosis     , severe, hypercalciuria, Boniva since 2005, T10 compression fracture 2009  . Goiter   . Polymyalgia rheumatica   . Carpal tunnel syndrome   . Other disorder of calcium metabolism   . Macular degeneration   . DJD (degenerative joint disease)   . Atrial fibrillation     paroxysmal, status post successful vessel cardioversion 5/09, skains,transient left ventricular systolic dysfunction, likely secondary to cardioversion/stunning of the myocardium, no evidence significant CAD, echo 7/09 shows normal ejection fraction  . Mitral regurgitation   . Chronic  anticoagulation   . Presence of permanent cardiac pacemaker   . Dysrhythmia   . Sinus drainage     in the morning, clear drainage  . GERD (gastroesophageal reflux disease)     OTC occasionally   Past Surgical History  Procedure Laterality Date  . Pacemaker insertion      Dual chamber permanent pacer insertion for tachy brady syndrome, 01/2009  . Femur fracture surgery    . Dilation and curettage of uterus    . Tonsillectomy and adenoidectomy    . Carpal tunnel release    . Total knee arthroplasty    . Eye surgery Bilateral     cataracts with lens   . Total hip arthroplasty Right 12/16/2014    Procedure: RIGHT TOTAL HIP ARTHROPLASTY ANTERIOR APPROACH;  Surgeon: Hessie Dibble, MD;  Location: [place];  Service: Orthopedics;  Laterality: Right;   Medication reviewed. See MAR  Family History  Problem Relation Age of Onset  . Cancer Sister   . Diabetes Sister   . Hypertension Mother   . Hypertension Father    History   Social History  . Marital Status: Widowed    Spouse Name: N/A  . Number of Children: N/A  . Years of Education: N/A   Occupational History  . Not on file.   Social History Main Topics  . Smoking status: Never Smoker   . Smokeless tobacco: Never Used  . Alcohol Use: Yes     Comment: wine and beer occasionally  . Drug Use: No  . Sexual Activity: Not on file   Other Topics Concern  . Not on file   Social History  Narrative    Physical exam BP 123/78 mmHg  Pulse 88  Temp(Src) 97 F (36.1 C)  Resp 16  SpO2 96%  General- elderly female in no acute distress Head- atraumatic, normocephalic Eyes- PERRLA, EOMI, no pallor, no icterus Neck- no lymphadenopathy Cardiovascular- normal s1,s2 Respiratory- bilateral clear to auscultation, no wheeze, no rhonchi, no crackles Abdomen- bowel sounds present, soft, non tender Musculoskeletal- able to move all 4 extremities, right leg rom limited at hip, right leg 1+ edema, ted hose + Skin- right hip surgical  incision with dressing in place, clean and dry Neurological- no focal deficit Psychiatry- alert and oriented to person, place and time, normal mood and affect  Labs reviewed   Assessment/plan  Right hip Osteoarthritis  S/P right total hip arthroplasty. Will have her work with physical therapy and occupational therapy team to help with gait training and muscle strengthening exercises.fall precautions. Skin care. Encourage to be out of bed. Continue Norco 5/325 mg 1-2 tabs bevery 4 hours prn pain and Robaxin 500 mg every 6 hours prn muscle spasm. Continue coumadin for dvt prophylaxis. Has f/u with orthopedics.  Constipation Add miralax 17 g daily with colace 100 mg bid and monitor. Hydration encouraged  Blood loss anemia Post op, monitor h&h  afib Rate controlled. Continue metoprolol 100 mg daily and coumadin for anticoagulation  HTN Controlled bp. Continue cozaar 5 mg daily with metoprolol 100 mg daily  Hypothyroidism continue levothyroxine 25 mcg daily   Goals of care- short term rehabilitation  Family/ staff Communication: reviewed care plan with patient and nursing supervisor  Blanchie Serve, MD  [place] [phone] (Monday-Friday 8 am - 5 pm) [phone] (afterhours)

## 2015-03-09 DIAGNOSIS — M6281 Muscle weakness (generalized): Secondary | ICD-10-CM | POA: Diagnosis not present

## 2015-03-09 DIAGNOSIS — Z96641 Presence of right artificial hip joint: Secondary | ICD-10-CM | POA: Diagnosis not present

## 2015-03-09 DIAGNOSIS — R262 Difficulty in walking, not elsewhere classified: Secondary | ICD-10-CM | POA: Diagnosis not present

## 2015-03-09 DIAGNOSIS — M25511 Pain in right shoulder: Secondary | ICD-10-CM | POA: Diagnosis not present

## 2015-03-10 ENCOUNTER — Ambulatory Visit (INDEPENDENT_AMBULATORY_CARE_PROVIDER_SITE_OTHER): Payer: Medicare Other | Admitting: *Deleted

## 2015-03-10 DIAGNOSIS — I495 Sick sinus syndrome: Secondary | ICD-10-CM

## 2015-03-11 ENCOUNTER — Ambulatory Visit (INDEPENDENT_AMBULATORY_CARE_PROVIDER_SITE_OTHER): Payer: Medicare Other | Admitting: *Deleted

## 2015-03-11 DIAGNOSIS — Z5181 Encounter for therapeutic drug level monitoring: Secondary | ICD-10-CM

## 2015-03-11 DIAGNOSIS — R262 Difficulty in walking, not elsewhere classified: Secondary | ICD-10-CM | POA: Diagnosis not present

## 2015-03-11 DIAGNOSIS — I4891 Unspecified atrial fibrillation: Secondary | ICD-10-CM | POA: Diagnosis not present

## 2015-03-11 DIAGNOSIS — I495 Sick sinus syndrome: Secondary | ICD-10-CM

## 2015-03-11 DIAGNOSIS — M25551 Pain in right hip: Secondary | ICD-10-CM | POA: Diagnosis not present

## 2015-03-11 DIAGNOSIS — Z7901 Long term (current) use of anticoagulants: Secondary | ICD-10-CM | POA: Diagnosis not present

## 2015-03-11 DIAGNOSIS — I480 Paroxysmal atrial fibrillation: Secondary | ICD-10-CM

## 2015-03-11 DIAGNOSIS — Z96641 Presence of right artificial hip joint: Secondary | ICD-10-CM | POA: Diagnosis not present

## 2015-03-11 DIAGNOSIS — M6281 Muscle weakness (generalized): Secondary | ICD-10-CM | POA: Diagnosis not present

## 2015-03-11 DIAGNOSIS — M1712 Unilateral primary osteoarthritis, left knee: Secondary | ICD-10-CM | POA: Diagnosis not present

## 2015-03-11 LAB — POCT INR: INR: 2

## 2015-03-11 NOTE — Progress Notes (Signed)
Remote pacemaker transmission.   

## 2015-03-12 LAB — CUP PACEART REMOTE DEVICE CHECK
Battery Impedance: 1413 Ohm
Battery Remaining Longevity: 53 mo
Battery Voltage: 2.77 V
Brady Statistic RV Percent Paced: 4 %
Date Time Interrogation Session: 20160511154931
Lead Channel Impedance Value: 606 Ohm
Lead Channel Impedance Value: 67 Ohm
Lead Channel Pacing Threshold Amplitude: 0.625 V
Lead Channel Pacing Threshold Pulse Width: 0.4 ms
Lead Channel Sensing Intrinsic Amplitude: 22.4 mV
Lead Channel Setting Pacing Amplitude: 2 V
Lead Channel Setting Pacing Pulse Width: 0.4 ms
Lead Channel Setting Sensing Sensitivity: 5.6 mV

## 2015-03-16 DIAGNOSIS — R262 Difficulty in walking, not elsewhere classified: Secondary | ICD-10-CM | POA: Diagnosis not present

## 2015-03-16 DIAGNOSIS — Z96641 Presence of right artificial hip joint: Secondary | ICD-10-CM | POA: Diagnosis not present

## 2015-03-16 DIAGNOSIS — M25551 Pain in right hip: Secondary | ICD-10-CM | POA: Diagnosis not present

## 2015-03-16 DIAGNOSIS — M6281 Muscle weakness (generalized): Secondary | ICD-10-CM | POA: Diagnosis not present

## 2015-03-18 DIAGNOSIS — M6281 Muscle weakness (generalized): Secondary | ICD-10-CM | POA: Diagnosis not present

## 2015-03-18 DIAGNOSIS — M25551 Pain in right hip: Secondary | ICD-10-CM | POA: Diagnosis not present

## 2015-03-18 DIAGNOSIS — Z96641 Presence of right artificial hip joint: Secondary | ICD-10-CM | POA: Diagnosis not present

## 2015-03-18 DIAGNOSIS — H3532 Exudative age-related macular degeneration: Secondary | ICD-10-CM | POA: Diagnosis not present

## 2015-03-18 DIAGNOSIS — R262 Difficulty in walking, not elsewhere classified: Secondary | ICD-10-CM | POA: Diagnosis not present

## 2015-03-20 ENCOUNTER — Encounter: Payer: Self-pay | Admitting: Cardiology

## 2015-03-23 DIAGNOSIS — R262 Difficulty in walking, not elsewhere classified: Secondary | ICD-10-CM | POA: Diagnosis not present

## 2015-03-23 DIAGNOSIS — M6281 Muscle weakness (generalized): Secondary | ICD-10-CM | POA: Diagnosis not present

## 2015-03-23 DIAGNOSIS — M25551 Pain in right hip: Secondary | ICD-10-CM | POA: Diagnosis not present

## 2015-03-23 DIAGNOSIS — Z96641 Presence of right artificial hip joint: Secondary | ICD-10-CM | POA: Diagnosis not present

## 2015-03-24 ENCOUNTER — Encounter: Payer: Self-pay | Admitting: Internal Medicine

## 2015-03-25 DIAGNOSIS — Z96641 Presence of right artificial hip joint: Secondary | ICD-10-CM | POA: Diagnosis not present

## 2015-03-25 DIAGNOSIS — R262 Difficulty in walking, not elsewhere classified: Secondary | ICD-10-CM | POA: Diagnosis not present

## 2015-03-25 DIAGNOSIS — M25551 Pain in right hip: Secondary | ICD-10-CM | POA: Diagnosis not present

## 2015-03-25 DIAGNOSIS — M6281 Muscle weakness (generalized): Secondary | ICD-10-CM | POA: Diagnosis not present

## 2015-03-29 ENCOUNTER — Other Ambulatory Visit: Payer: Self-pay | Admitting: Cardiology

## 2015-04-01 ENCOUNTER — Ambulatory Visit (INDEPENDENT_AMBULATORY_CARE_PROVIDER_SITE_OTHER): Payer: Medicare Other | Admitting: *Deleted

## 2015-04-01 ENCOUNTER — Other Ambulatory Visit: Payer: Self-pay | Admitting: Internal Medicine

## 2015-04-01 ENCOUNTER — Ambulatory Visit
Admission: RE | Admit: 2015-04-01 | Discharge: 2015-04-01 | Disposition: A | Discharge status: Home / Self Care | Payer: Medicare Other | Source: Ambulatory Visit | Attending: Internal Medicine | Admitting: Internal Medicine

## 2015-04-01 DIAGNOSIS — Z7901 Long term (current) use of anticoagulants: Secondary | ICD-10-CM | POA: Diagnosis not present

## 2015-04-01 DIAGNOSIS — R9389 Abnormal findings on diagnostic imaging of other specified body structures: Secondary | ICD-10-CM

## 2015-04-01 DIAGNOSIS — I4891 Unspecified atrial fibrillation: Secondary | ICD-10-CM

## 2015-04-01 DIAGNOSIS — J309 Allergic rhinitis, unspecified: Secondary | ICD-10-CM | POA: Diagnosis not present

## 2015-04-01 DIAGNOSIS — I10 Essential (primary) hypertension: Secondary | ICD-10-CM | POA: Diagnosis not present

## 2015-04-01 DIAGNOSIS — Z5181 Encounter for therapeutic drug level monitoring: Secondary | ICD-10-CM | POA: Diagnosis not present

## 2015-04-01 DIAGNOSIS — E039 Hypothyroidism, unspecified: Secondary | ICD-10-CM | POA: Diagnosis not present

## 2015-04-01 DIAGNOSIS — I480 Paroxysmal atrial fibrillation: Secondary | ICD-10-CM

## 2015-04-01 DIAGNOSIS — R05 Cough: Secondary | ICD-10-CM | POA: Diagnosis not present

## 2015-04-01 DIAGNOSIS — Z1389 Encounter for screening for other disorder: Secondary | ICD-10-CM | POA: Diagnosis not present

## 2015-04-01 DIAGNOSIS — R938 Abnormal findings on diagnostic imaging of other specified body structures: Secondary | ICD-10-CM | POA: Diagnosis not present

## 2015-04-01 DIAGNOSIS — M159 Polyosteoarthritis, unspecified: Secondary | ICD-10-CM | POA: Diagnosis not present

## 2015-04-01 DIAGNOSIS — I482 Chronic atrial fibrillation: Secondary | ICD-10-CM | POA: Diagnosis not present

## 2015-04-01 LAB — POCT INR: INR: 2

## 2015-04-22 DIAGNOSIS — H3532 Exudative age-related macular degeneration: Secondary | ICD-10-CM | POA: Diagnosis not present

## 2015-04-22 DIAGNOSIS — H35052 Retinal neovascularization, unspecified, left eye: Secondary | ICD-10-CM | POA: Diagnosis not present

## 2015-04-22 DIAGNOSIS — H43813 Vitreous degeneration, bilateral: Secondary | ICD-10-CM | POA: Diagnosis not present

## 2015-04-24 DIAGNOSIS — H35052 Retinal neovascularization, unspecified, left eye: Secondary | ICD-10-CM | POA: Diagnosis not present

## 2015-04-24 DIAGNOSIS — H3532 Exudative age-related macular degeneration: Secondary | ICD-10-CM | POA: Diagnosis not present

## 2015-04-27 ENCOUNTER — Other Ambulatory Visit: Payer: Self-pay | Admitting: Cardiology

## 2015-04-27 ENCOUNTER — Other Ambulatory Visit: Payer: Self-pay

## 2015-04-27 MED ORDER — LOSARTAN POTASSIUM 25 MG PO TABS: 25.0000 mg | ORAL_TABLET | Freq: Every day | ORAL | Status: DC

## 2015-04-29 ENCOUNTER — Ambulatory Visit (INDEPENDENT_AMBULATORY_CARE_PROVIDER_SITE_OTHER): Payer: Medicare Other | Admitting: *Deleted

## 2015-04-29 DIAGNOSIS — Z7901 Long term (current) use of anticoagulants: Secondary | ICD-10-CM

## 2015-04-29 DIAGNOSIS — I480 Paroxysmal atrial fibrillation: Secondary | ICD-10-CM | POA: Diagnosis not present

## 2015-04-29 DIAGNOSIS — I4891 Unspecified atrial fibrillation: Secondary | ICD-10-CM | POA: Diagnosis not present

## 2015-04-29 DIAGNOSIS — Z5181 Encounter for therapeutic drug level monitoring: Secondary | ICD-10-CM | POA: Diagnosis not present

## 2015-04-29 LAB — POCT INR: INR: 2

## 2015-06-02 ENCOUNTER — Other Ambulatory Visit: Payer: Self-pay | Admitting: Cardiology

## 2015-06-03 ENCOUNTER — Ambulatory Visit (INDEPENDENT_AMBULATORY_CARE_PROVIDER_SITE_OTHER): Payer: Medicare Other | Admitting: *Deleted

## 2015-06-03 DIAGNOSIS — Z5181 Encounter for therapeutic drug level monitoring: Secondary | ICD-10-CM

## 2015-06-03 DIAGNOSIS — Z7901 Long term (current) use of anticoagulants: Secondary | ICD-10-CM | POA: Diagnosis not present

## 2015-06-03 DIAGNOSIS — I4891 Unspecified atrial fibrillation: Secondary | ICD-10-CM | POA: Diagnosis not present

## 2015-06-03 DIAGNOSIS — I480 Paroxysmal atrial fibrillation: Secondary | ICD-10-CM

## 2015-06-03 LAB — POCT INR: INR: 1.7

## 2015-06-09 ENCOUNTER — Other Ambulatory Visit: Payer: Self-pay | Admitting: Nurse Practitioner

## 2015-06-10 DIAGNOSIS — M1712 Unilateral primary osteoarthritis, left knee: Secondary | ICD-10-CM | POA: Diagnosis not present

## 2015-06-11 ENCOUNTER — Telehealth: Payer: Self-pay | Admitting: Cardiology

## 2015-06-11 ENCOUNTER — Ambulatory Visit (INDEPENDENT_AMBULATORY_CARE_PROVIDER_SITE_OTHER): Payer: Medicare Other | Admitting: *Deleted

## 2015-06-11 DIAGNOSIS — I495 Sick sinus syndrome: Secondary | ICD-10-CM | POA: Diagnosis not present

## 2015-06-11 NOTE — Telephone Encounter (Signed)
LMOVM reminding pt to send remote transmission.   

## 2015-06-11 NOTE — Progress Notes (Signed)
Remote pacemaker transmission.   

## 2015-06-17 ENCOUNTER — Ambulatory Visit (INDEPENDENT_AMBULATORY_CARE_PROVIDER_SITE_OTHER): Payer: Medicare Other | Admitting: *Deleted

## 2015-06-17 DIAGNOSIS — I4891 Unspecified atrial fibrillation: Secondary | ICD-10-CM

## 2015-06-17 DIAGNOSIS — I480 Paroxysmal atrial fibrillation: Secondary | ICD-10-CM

## 2015-06-17 DIAGNOSIS — Z7901 Long term (current) use of anticoagulants: Secondary | ICD-10-CM | POA: Diagnosis not present

## 2015-06-17 DIAGNOSIS — Z5181 Encounter for therapeutic drug level monitoring: Secondary | ICD-10-CM

## 2015-06-17 LAB — CUP PACEART REMOTE DEVICE CHECK
Battery Impedance: 1497 Ohm
Battery Remaining Longevity: 51 mo
Battery Voltage: 2.76 V
Brady Statistic RV Percent Paced: 5 %
Date Time Interrogation Session: 20160811164221
Lead Channel Impedance Value: 619 Ohm
Lead Channel Impedance Value: 67 Ohm
Lead Channel Pacing Threshold Amplitude: 0.75 V
Lead Channel Pacing Threshold Pulse Width: 0.4 ms
Lead Channel Sensing Intrinsic Amplitude: 11.2 mV
Lead Channel Setting Pacing Amplitude: 2 V
Lead Channel Setting Pacing Pulse Width: 0.4 ms
Lead Channel Setting Sensing Sensitivity: 5.6 mV

## 2015-06-17 LAB — POCT INR: INR: 2.4

## 2015-06-19 ENCOUNTER — Other Ambulatory Visit: Payer: Self-pay | Admitting: Cardiology

## 2015-06-22 DIAGNOSIS — R300 Dysuria: Secondary | ICD-10-CM | POA: Diagnosis not present

## 2015-06-22 DIAGNOSIS — R5383 Other fatigue: Secondary | ICD-10-CM | POA: Diagnosis not present

## 2015-06-22 DIAGNOSIS — N390 Urinary tract infection, site not specified: Secondary | ICD-10-CM | POA: Diagnosis not present

## 2015-06-22 DIAGNOSIS — E871 Hypo-osmolality and hyponatremia: Secondary | ICD-10-CM | POA: Diagnosis not present

## 2015-06-26 DIAGNOSIS — N390 Urinary tract infection, site not specified: Secondary | ICD-10-CM | POA: Diagnosis not present

## 2015-06-26 DIAGNOSIS — E871 Hypo-osmolality and hyponatremia: Secondary | ICD-10-CM | POA: Diagnosis not present

## 2015-06-26 DIAGNOSIS — R609 Edema, unspecified: Secondary | ICD-10-CM | POA: Diagnosis not present

## 2015-06-26 DIAGNOSIS — I4891 Unspecified atrial fibrillation: Secondary | ICD-10-CM | POA: Diagnosis not present

## 2015-06-28 ENCOUNTER — Other Ambulatory Visit: Payer: Self-pay | Admitting: Adult Health

## 2015-07-02 ENCOUNTER — Encounter: Payer: Self-pay | Admitting: Cardiology

## 2015-07-12 ENCOUNTER — Other Ambulatory Visit: Payer: Self-pay | Admitting: Cardiology

## 2015-07-13 ENCOUNTER — Encounter: Payer: Self-pay | Admitting: Internal Medicine

## 2015-07-15 ENCOUNTER — Ambulatory Visit (INDEPENDENT_AMBULATORY_CARE_PROVIDER_SITE_OTHER): Payer: Medicare Other | Admitting: *Deleted

## 2015-07-15 DIAGNOSIS — I480 Paroxysmal atrial fibrillation: Secondary | ICD-10-CM

## 2015-07-15 DIAGNOSIS — Z5181 Encounter for therapeutic drug level monitoring: Secondary | ICD-10-CM

## 2015-07-15 DIAGNOSIS — Z7901 Long term (current) use of anticoagulants: Secondary | ICD-10-CM | POA: Diagnosis not present

## 2015-07-15 DIAGNOSIS — M1712 Unilateral primary osteoarthritis, left knee: Secondary | ICD-10-CM | POA: Diagnosis not present

## 2015-07-15 DIAGNOSIS — I4891 Unspecified atrial fibrillation: Secondary | ICD-10-CM

## 2015-07-15 DIAGNOSIS — H35053 Retinal neovascularization, unspecified, bilateral: Secondary | ICD-10-CM | POA: Diagnosis not present

## 2015-07-15 DIAGNOSIS — M25562 Pain in left knee: Secondary | ICD-10-CM | POA: Diagnosis not present

## 2015-07-15 DIAGNOSIS — H3532 Exudative age-related macular degeneration: Secondary | ICD-10-CM | POA: Diagnosis not present

## 2015-07-15 DIAGNOSIS — H43813 Vitreous degeneration, bilateral: Secondary | ICD-10-CM | POA: Diagnosis not present

## 2015-07-15 LAB — POCT INR: INR: 4.2

## 2015-07-22 DIAGNOSIS — M1712 Unilateral primary osteoarthritis, left knee: Secondary | ICD-10-CM | POA: Diagnosis not present

## 2015-07-24 ENCOUNTER — Other Ambulatory Visit: Payer: Self-pay | Admitting: Cardiology

## 2015-07-29 ENCOUNTER — Ambulatory Visit (INDEPENDENT_AMBULATORY_CARE_PROVIDER_SITE_OTHER): Payer: Medicare Other | Admitting: *Deleted

## 2015-07-29 DIAGNOSIS — I480 Paroxysmal atrial fibrillation: Secondary | ICD-10-CM

## 2015-07-29 DIAGNOSIS — I4891 Unspecified atrial fibrillation: Secondary | ICD-10-CM | POA: Diagnosis not present

## 2015-07-29 DIAGNOSIS — Z5181 Encounter for therapeutic drug level monitoring: Secondary | ICD-10-CM

## 2015-07-29 DIAGNOSIS — Z7901 Long term (current) use of anticoagulants: Secondary | ICD-10-CM | POA: Diagnosis not present

## 2015-07-29 DIAGNOSIS — M1712 Unilateral primary osteoarthritis, left knee: Secondary | ICD-10-CM | POA: Diagnosis not present

## 2015-07-29 LAB — POCT INR: INR: 3.1

## 2015-08-05 DIAGNOSIS — M1712 Unilateral primary osteoarthritis, left knee: Secondary | ICD-10-CM | POA: Diagnosis not present

## 2015-08-07 ENCOUNTER — Other Ambulatory Visit: Payer: Self-pay | Admitting: Cardiology

## 2015-08-12 ENCOUNTER — Ambulatory Visit (INDEPENDENT_AMBULATORY_CARE_PROVIDER_SITE_OTHER): Payer: Medicare Other | Admitting: *Deleted

## 2015-08-12 DIAGNOSIS — M1712 Unilateral primary osteoarthritis, left knee: Secondary | ICD-10-CM | POA: Diagnosis not present

## 2015-08-12 DIAGNOSIS — Z5181 Encounter for therapeutic drug level monitoring: Secondary | ICD-10-CM

## 2015-08-12 DIAGNOSIS — I4891 Unspecified atrial fibrillation: Secondary | ICD-10-CM

## 2015-08-12 DIAGNOSIS — Z7901 Long term (current) use of anticoagulants: Secondary | ICD-10-CM

## 2015-08-12 DIAGNOSIS — I480 Paroxysmal atrial fibrillation: Secondary | ICD-10-CM | POA: Diagnosis not present

## 2015-08-12 LAB — POCT INR: INR: 2

## 2015-08-14 ENCOUNTER — Other Ambulatory Visit: Payer: Self-pay | Admitting: Cardiology

## 2015-08-14 NOTE — Telephone Encounter (Signed)
Jerline Pain, MD at 09/17/2014 10:57 AM  losartan (COZAAR) 25 MG tabletTAKE 1 TABLET BY MOUTH DAILY Patient Instructions     The current medical regimen is effective; continue present plan and medications.

## 2015-08-24 ENCOUNTER — Telehealth: Payer: Self-pay | Admitting: *Deleted

## 2015-08-24 MED ORDER — WARFARIN SODIUM 4 MG PO TABS: ORAL_TABLET | ORAL | Status: DC

## 2015-08-24 NOTE — Telephone Encounter (Signed)
Rx sent per pt request 

## 2015-08-26 ENCOUNTER — Other Ambulatory Visit: Payer: Self-pay | Admitting: Cardiology

## 2015-08-28 DIAGNOSIS — Z23 Encounter for immunization: Secondary | ICD-10-CM | POA: Diagnosis not present

## 2015-08-28 DIAGNOSIS — I872 Venous insufficiency (chronic) (peripheral): Secondary | ICD-10-CM | POA: Diagnosis not present

## 2015-09-02 ENCOUNTER — Ambulatory Visit (INDEPENDENT_AMBULATORY_CARE_PROVIDER_SITE_OTHER): Payer: Medicare Other | Admitting: *Deleted

## 2015-09-02 DIAGNOSIS — I480 Paroxysmal atrial fibrillation: Secondary | ICD-10-CM | POA: Diagnosis not present

## 2015-09-02 DIAGNOSIS — Z7901 Long term (current) use of anticoagulants: Secondary | ICD-10-CM

## 2015-09-02 DIAGNOSIS — Z5181 Encounter for therapeutic drug level monitoring: Secondary | ICD-10-CM | POA: Diagnosis not present

## 2015-09-02 DIAGNOSIS — I4891 Unspecified atrial fibrillation: Secondary | ICD-10-CM | POA: Diagnosis not present

## 2015-09-02 LAB — POCT INR: INR: 1.7

## 2015-09-07 ENCOUNTER — Other Ambulatory Visit: Payer: Self-pay | Admitting: Cardiology

## 2015-09-09 ENCOUNTER — Ambulatory Visit (INDEPENDENT_AMBULATORY_CARE_PROVIDER_SITE_OTHER): Payer: Medicare Other | Admitting: *Deleted

## 2015-09-09 ENCOUNTER — Encounter: Payer: Self-pay | Admitting: Cardiology

## 2015-09-09 ENCOUNTER — Ambulatory Visit (INDEPENDENT_AMBULATORY_CARE_PROVIDER_SITE_OTHER): Payer: Medicare Other | Admitting: Cardiology

## 2015-09-09 VITALS — BP 122/78 | HR 95 | Ht 67.0 in | Wt 155.4 lb

## 2015-09-09 DIAGNOSIS — I10 Essential (primary) hypertension: Secondary | ICD-10-CM | POA: Diagnosis not present

## 2015-09-09 DIAGNOSIS — I4891 Unspecified atrial fibrillation: Secondary | ICD-10-CM

## 2015-09-09 DIAGNOSIS — Z7901 Long term (current) use of anticoagulants: Secondary | ICD-10-CM

## 2015-09-09 DIAGNOSIS — I359 Nonrheumatic aortic valve disorder, unspecified: Secondary | ICD-10-CM

## 2015-09-09 DIAGNOSIS — Z5181 Encounter for therapeutic drug level monitoring: Secondary | ICD-10-CM

## 2015-09-09 DIAGNOSIS — I481 Persistent atrial fibrillation: Secondary | ICD-10-CM

## 2015-09-09 DIAGNOSIS — I4819 Other persistent atrial fibrillation: Secondary | ICD-10-CM

## 2015-09-09 DIAGNOSIS — Z950 Presence of cardiac pacemaker: Secondary | ICD-10-CM

## 2015-09-09 LAB — POCT INR: INR: 2

## 2015-09-09 MED ORDER — METOPROLOL SUCCINATE ER 100 MG PO TB24: 100.0000 mg | ORAL_TABLET | Freq: Every day | ORAL | Status: DC

## 2015-09-09 MED ORDER — LOSARTAN POTASSIUM 25 MG PO TABS: 25.0000 mg | ORAL_TABLET | Freq: Every day | ORAL | Status: DC

## 2015-09-09 NOTE — Patient Instructions (Signed)

## 2015-09-09 NOTE — Progress Notes (Signed)
[place]. [address], [place], [place]  [address] Phone: [phone] Fax:  [phone]  Date:  09/09/2015   ID:  Patricia Holt, Patricia Holt [birth year], MRN [id number]  PCP:  Patricia Shelling, MD   History of Present Illness: Patricia Holt is a 81 y.o. female persistent atrial fibrillation, pacemaker dual chamber because of prior tachycardia bradycardia syndrome in April of 2010, chronic anticoagulation with recent bout of cardiomyopathy following cardioversion that spontaneously resolved here for followup.  On 11/30/11-her lisinopril was stopped because of chronic cough. Losartan was started. Occasional cough still persisted. Aortic valve showed moderate aortic insufficiency. 8/11 echocardiogram. Furosemide 40 mg was started at last clinic visit to be taken on as-needed basis if weight increased.  Loosing balance so stopped golf. Walking daily. No changes with breathing.   09/17/14 - has occasional feeling of dizziness. Overall asymptomatic. No shortness of breath, no chest pain. Doing quite well. About to undergo right hip operation in January.  09/09/15 - right hip replacement, Left knee replacement Patricia Holt, her son, birthday party, 80. She is having some lower extremity edema post hip surgery, knee injection. Otherwise doing well. No chest pain, no shortness of breath. Equilibrium issues prohibit her from playing golf. She still goes out and plays bunco.   Wt Readings from Last 3 Encounters:  09/09/15 155 lb 6.4 oz (70.489 kg)  12/31/14 155 lb 9.6 oz (70.58 kg)  12/16/14 150 lb (68.04 kg)     Past Medical History  Diagnosis Date  . HTN (hypertension)   . Hyperlipidemia   . Hypothyroidism   . Sinoatrial node dysfunction (HCC)   . Aortic valve disorder   . Osteoporosis     , severe, hypercalciuria, Boniva since 2005, T10 compression fracture 2009  . Goiter   . Polymyalgia rheumatica ([place])   . Carpal tunnel syndrome   . Other disorder of calcium metabolism   .  Macular degeneration   . DJD (degenerative joint disease)   . Atrial fibrillation (HCC)     paroxysmal, status post successful vessel cardioversion 5/09, Patricia Holt,transient left ventricular systolic dysfunction, likely secondary to cardioversion/stunning of the myocardium, no evidence significant CAD, echo 7/09 shows normal ejection fraction  . Mitral regurgitation   . Chronic anticoagulation   . Presence of permanent cardiac pacemaker   . Dysrhythmia   . Sinus drainage     in the morning, clear drainage  . GERD (gastroesophageal reflux disease)     OTC occasionally    Past Surgical History  Procedure Laterality Date  . Pacemaker insertion      Dual chamber permanent pacer insertion for tachy brady syndrome, 01/2009  . Femur fracture surgery    . Dilation and curettage of uterus    . Tonsillectomy and adenoidectomy    . Carpal tunnel release    . Total knee arthroplasty    . Eye surgery Bilateral     cataracts with lens   . Total hip arthroplasty Right 12/16/2014    Procedure: RIGHT TOTAL HIP ARTHROPLASTY ANTERIOR APPROACH;  Surgeon: Patricia Dibble, MD;  Location: [place];  Service: Orthopedics;  Laterality: Right;    Current Outpatient Prescriptions  Medication Sig Dispense Refill  . acetaminophen (TYLENOL) 500 MG tablet Take 500 mg by mouth every 8 (eight) hours as needed (pain).    . CALCIUM PO Take 500 mg by mouth 2 (two) times daily.    . Cholecalciferol (VITAMIN D PO) Take 1 tablet by mouth daily.    Marland Kitchen  Dextromethorphan-Guaifenesin 10-200 MG CAPS Take 1 tablet by mouth 2 (two) times daily as needed (cold symptoms).    . docusate sodium (COLACE) 50 MG capsule Take by mouth daily as needed (constipation).     . fluticasone (FLONASE) 50 MCG/ACT nasal spray Place 2 sprays into the nose daily as needed for allergies (nasal drip).     . furosemide (LASIX) 40 MG tablet Take 20 mg by mouth as needed for fluid.     Marland Kitchen HYDROcodone-acetaminophen (NORCO/VICODIN) 5-325 MG per tablet Take 1-2  tablets by mouth every 4 (four) hours as needed (breakthrough pain). 50 tablet 0  . levothyroxine (SYNTHROID, LEVOTHROID) 25 MCG tablet TAKE 1 TABLET BY MOUTH DAILY 30 tablet 3  . losartan (COZAAR) 25 MG tablet TAKE 1 TABLET BY MOUTH EVERY DAY. NEED APPOINTMENT FOR FUTURE REFILLS. 30 tablet 0  . Menthol, Topical Analgesic, (BENGAY EX) Apply 1 application topically 2 (two) times daily as needed (leg pain).    . metoprolol succinate (TOPROL-XL) 100 MG 24 hr tablet TAKE 1 TABLET BY MOUTH DAILY WITH OR IMMEDIATELY FOLLOWING A MEAL(PLEASE CALL AND SCHEDULE AN APPOINTMENT) 30 tablet 0  . Multiple Vitamins-Minerals (ICAPS PO) Take 1 capsule by mouth 2 (two) times daily.     Marland Kitchen OVER THE COUNTER MEDICATION Take by mouth daily as needed (nasal drip). Tylenol Cold - Mucous Severe (dextromethorphan, acetaminophen, phenylephrine, guaifenesin) - takes early morning if she wakes up with a nasal drip (1 swig)    . warfarin (COUMADIN) 4 MG tablet USE AS DIRECTED BY COUMADIN CLINIC 30 tablet 3   No current facility-administered medications for this visit.    Allergies:   No Known Allergies  Social History:  The patient  reports that she has never smoked. She has never used smokeless tobacco. She reports that she drinks alcohol. She reports that she does not use illicit drugs.   ROS:  Please see the history of present illness.   Knee pain when walking. Using cane. Has some balance issues so she does not play golf. No chest pain, no shortness of breath. No bleeding with Coumadin. No strokelike symptoms.   All other systems reviewed and negative.   PHYSICAL EXAM: VS:  BP 122/78 mmHg  Pulse 95  Ht 5\' 7"  (1.702 m)  Wt 155 lb 6.4 oz (70.489 kg)  BMI 24.33 kg/m2 Well nourished, well developed, in no acute distress HEENT: normal Neck: no JVD Cardiac:  Irregularly irregular, normal rate; 1/6 diastolic murmur right upper sternal border Lungs:  clear to auscultation bilaterally, no wheezing, rhonchi or rales Abd:  soft, nontender, no hepatomegaly Ext: no edema Skin: warm and dry Neuro: no focal abnormalities noted  EKG: Today 09/09/15-atrial fibrillation heart rate 95 bpm, isolated ventricular paced beat. 09/17/14- Atrial fibrillation 94, left axis deviation,no ST segment changes, no significant change from prior  ASSESSMENT AND PLAN:  1. Post hip replacement. Edema noted.  2. Atrial fibrillation-persistent, doing well. Well rate controlled on current medications. No changes made. CHADS-VASc - 4 (hypertension, age, female)  3. Chronic anticoagulation-no bleeding. Continue to monitor closely. Monitored here. 4. Hypertension-currently on losartan. Doing well. Prior ACE-I cough.  5. We will see her back in 6 months for followup. 6. Aortic regurgitation-mild to moderate. No change clinically. No need for repeat echocardiogram at this time.  Signed, Candee Furbish, MD [facility]  09/09/2015 1:32 PM

## 2015-09-10 ENCOUNTER — Ambulatory Visit (INDEPENDENT_AMBULATORY_CARE_PROVIDER_SITE_OTHER): Payer: Medicare Other | Admitting: *Deleted

## 2015-09-10 DIAGNOSIS — I495 Sick sinus syndrome: Secondary | ICD-10-CM | POA: Diagnosis not present

## 2015-09-10 NOTE — Progress Notes (Signed)
Remote pacemaker transmission.   

## 2015-09-11 ENCOUNTER — Encounter: Payer: Self-pay | Admitting: Cardiology

## 2015-09-11 LAB — CUP PACEART REMOTE DEVICE CHECK
Battery Impedance: 1696 Ohm
Battery Remaining Longevity: 47 mo
Battery Voltage: 2.76 V
Brady Statistic RV Percent Paced: 5 %
Date Time Interrogation Session: 20161110135315
Implantable Lead Implant Date: 20100409
Implantable Lead Implant Date: 20100409
Implantable Lead Location: 753859
Implantable Lead Location: 753860
Implantable Lead Model: 5076
Implantable Lead Model: 5092
Lead Channel Impedance Value: 630 Ohm
Lead Channel Impedance Value: 67 Ohm
Lead Channel Pacing Threshold Amplitude: 0.625 V
Lead Channel Pacing Threshold Pulse Width: 0.4 ms
Lead Channel Sensing Intrinsic Amplitude: 11.2 mV
Lead Channel Setting Pacing Amplitude: 2 V
Lead Channel Setting Pacing Pulse Width: 0.4 ms
Lead Channel Setting Sensing Sensitivity: 5.6 mV

## 2015-09-16 DIAGNOSIS — H353231 Exudative age-related macular degeneration, bilateral, with active choroidal neovascularization: Secondary | ICD-10-CM | POA: Diagnosis not present

## 2015-09-16 DIAGNOSIS — H43813 Vitreous degeneration, bilateral: Secondary | ICD-10-CM | POA: Diagnosis not present

## 2015-10-07 ENCOUNTER — Ambulatory Visit (INDEPENDENT_AMBULATORY_CARE_PROVIDER_SITE_OTHER): Payer: Medicare Other | Admitting: Pharmacist

## 2015-10-07 DIAGNOSIS — I481 Persistent atrial fibrillation: Secondary | ICD-10-CM | POA: Diagnosis not present

## 2015-10-07 DIAGNOSIS — I4819 Other persistent atrial fibrillation: Secondary | ICD-10-CM

## 2015-10-07 DIAGNOSIS — Z5181 Encounter for therapeutic drug level monitoring: Secondary | ICD-10-CM

## 2015-10-07 DIAGNOSIS — Z7901 Long term (current) use of anticoagulants: Secondary | ICD-10-CM | POA: Diagnosis not present

## 2015-10-07 DIAGNOSIS — I4891 Unspecified atrial fibrillation: Secondary | ICD-10-CM

## 2015-10-07 LAB — POCT INR: INR: 2

## 2015-10-14 DIAGNOSIS — I482 Chronic atrial fibrillation: Secondary | ICD-10-CM | POA: Diagnosis not present

## 2015-10-14 DIAGNOSIS — R300 Dysuria: Secondary | ICD-10-CM | POA: Diagnosis not present

## 2015-10-14 DIAGNOSIS — I10 Essential (primary) hypertension: Secondary | ICD-10-CM | POA: Diagnosis not present

## 2015-10-16 DIAGNOSIS — I482 Chronic atrial fibrillation: Secondary | ICD-10-CM | POA: Diagnosis not present

## 2015-10-16 DIAGNOSIS — Z5181 Encounter for therapeutic drug level monitoring: Secondary | ICD-10-CM | POA: Diagnosis not present

## 2015-10-21 DIAGNOSIS — R609 Edema, unspecified: Secondary | ICD-10-CM | POA: Diagnosis not present

## 2015-10-21 DIAGNOSIS — I482 Chronic atrial fibrillation: Secondary | ICD-10-CM | POA: Diagnosis not present

## 2015-11-04 ENCOUNTER — Ambulatory Visit (INDEPENDENT_AMBULATORY_CARE_PROVIDER_SITE_OTHER): Payer: Medicare Other | Admitting: *Deleted

## 2015-11-04 DIAGNOSIS — I4819 Other persistent atrial fibrillation: Secondary | ICD-10-CM

## 2015-11-04 DIAGNOSIS — I4891 Unspecified atrial fibrillation: Secondary | ICD-10-CM | POA: Diagnosis not present

## 2015-11-04 DIAGNOSIS — Z5181 Encounter for therapeutic drug level monitoring: Secondary | ICD-10-CM | POA: Diagnosis not present

## 2015-11-04 DIAGNOSIS — I481 Persistent atrial fibrillation: Secondary | ICD-10-CM | POA: Diagnosis not present

## 2015-11-04 DIAGNOSIS — Z7901 Long term (current) use of anticoagulants: Secondary | ICD-10-CM | POA: Diagnosis not present

## 2015-11-04 LAB — POCT INR: INR: 2.8

## 2015-11-18 DIAGNOSIS — H35421 Microcystoid degeneration of retina, right eye: Secondary | ICD-10-CM | POA: Diagnosis not present

## 2015-11-18 DIAGNOSIS — H353231 Exudative age-related macular degeneration, bilateral, with active choroidal neovascularization: Secondary | ICD-10-CM | POA: Diagnosis not present

## 2015-11-18 DIAGNOSIS — H43813 Vitreous degeneration, bilateral: Secondary | ICD-10-CM | POA: Diagnosis not present

## 2015-11-24 DIAGNOSIS — R609 Edema, unspecified: Secondary | ICD-10-CM | POA: Diagnosis not present

## 2015-11-24 DIAGNOSIS — I519 Heart disease, unspecified: Secondary | ICD-10-CM | POA: Diagnosis not present

## 2015-11-24 DIAGNOSIS — Z5181 Encounter for therapeutic drug level monitoring: Secondary | ICD-10-CM | POA: Diagnosis not present

## 2015-12-09 ENCOUNTER — Ambulatory Visit (INDEPENDENT_AMBULATORY_CARE_PROVIDER_SITE_OTHER): Payer: Medicare Other | Admitting: *Deleted

## 2015-12-09 DIAGNOSIS — Z5181 Encounter for therapeutic drug level monitoring: Secondary | ICD-10-CM

## 2015-12-09 DIAGNOSIS — I4891 Unspecified atrial fibrillation: Secondary | ICD-10-CM | POA: Diagnosis not present

## 2015-12-09 DIAGNOSIS — I4819 Other persistent atrial fibrillation: Secondary | ICD-10-CM

## 2015-12-09 DIAGNOSIS — I481 Persistent atrial fibrillation: Secondary | ICD-10-CM

## 2015-12-09 DIAGNOSIS — Z7901 Long term (current) use of anticoagulants: Secondary | ICD-10-CM | POA: Diagnosis not present

## 2015-12-09 LAB — POCT INR: INR: 1.9

## 2015-12-14 ENCOUNTER — Other Ambulatory Visit: Payer: Self-pay | Admitting: Cardiology

## 2015-12-15 DIAGNOSIS — M549 Dorsalgia, unspecified: Secondary | ICD-10-CM | POA: Diagnosis not present

## 2015-12-15 DIAGNOSIS — R05 Cough: Secondary | ICD-10-CM | POA: Diagnosis not present

## 2015-12-15 DIAGNOSIS — R609 Edema, unspecified: Secondary | ICD-10-CM | POA: Diagnosis not present

## 2015-12-31 ENCOUNTER — Ambulatory Visit (INDEPENDENT_AMBULATORY_CARE_PROVIDER_SITE_OTHER): Payer: Medicare Other | Admitting: Pharmacist

## 2015-12-31 ENCOUNTER — Encounter: Payer: Self-pay | Admitting: Internal Medicine

## 2015-12-31 ENCOUNTER — Ambulatory Visit (INDEPENDENT_AMBULATORY_CARE_PROVIDER_SITE_OTHER): Payer: Medicare Other | Admitting: Internal Medicine

## 2015-12-31 VITALS — BP 128/66 | HR 84 | Ht 67.0 in | Wt 142.3 lb

## 2015-12-31 DIAGNOSIS — I4891 Unspecified atrial fibrillation: Secondary | ICD-10-CM | POA: Diagnosis not present

## 2015-12-31 DIAGNOSIS — Z7901 Long term (current) use of anticoagulants: Secondary | ICD-10-CM

## 2015-12-31 DIAGNOSIS — I4819 Other persistent atrial fibrillation: Secondary | ICD-10-CM

## 2015-12-31 DIAGNOSIS — I481 Persistent atrial fibrillation: Secondary | ICD-10-CM

## 2015-12-31 DIAGNOSIS — Z5181 Encounter for therapeutic drug level monitoring: Secondary | ICD-10-CM

## 2015-12-31 LAB — CUP PACEART INCLINIC DEVICE CHECK
Battery Impedance: 1812 Ohm
Battery Remaining Longevity: 44 mo
Battery Voltage: 2.76 V
Brady Statistic RV Percent Paced: 9 %
Date Time Interrogation Session: 20170302130625
Implantable Lead Implant Date: 20100409
Implantable Lead Implant Date: 20100409
Implantable Lead Location: 753859
Implantable Lead Location: 753860
Implantable Lead Model: 5076
Implantable Lead Model: 5092
Lead Channel Impedance Value: 658 Ohm
Lead Channel Impedance Value: 67 Ohm
Lead Channel Pacing Threshold Amplitude: 0.625 V
Lead Channel Pacing Threshold Amplitude: 0.75 V
Lead Channel Pacing Threshold Pulse Width: 0.4 ms
Lead Channel Pacing Threshold Pulse Width: 0.4 ms
Lead Channel Sensing Intrinsic Amplitude: 15.67 mV
Lead Channel Setting Pacing Amplitude: 2 V
Lead Channel Setting Pacing Pulse Width: 0.4 ms
Lead Channel Setting Sensing Sensitivity: 5.6 mV

## 2015-12-31 LAB — POCT INR: INR: 2.2

## 2015-12-31 NOTE — Progress Notes (Signed)
HPI Patricia Holt returns today for ongoing evaluation and management of her PPM and now chronic atrial fibrillation. She is a very pleasant 82 yo woman who developed symptomatic bradycardia and underwent PPM in 2010. She initially developed atrial fibrillation in 2009. She has now had chronic atrial fibrillation and been treated with a strategy of rate control. Despite her advanced age, she has remained active. She has known mild aortic stenosis. No syncope. No edema. She does admit to becoming more sedentary in the past year. No Known Allergies   Current Outpatient Prescriptions  Medication Sig Dispense Refill  . acetaminophen (TYLENOL) 500 MG tablet Take 500 mg by mouth every 8 (eight) hours as needed (pain).    . CALCIUM PO Take 500 mg by mouth 2 (two) times daily.    . Cholecalciferol (VITAMIN D PO) Take 1 tablet by mouth daily.    Marland Kitchen Dextromethorphan-Guaifenesin 10-200 MG CAPS Take 1 tablet by mouth 2 (two) times daily as needed (cold symptoms).    Marland Kitchen diltiazem (TIAZAC) 240 MG 24 hr capsule Take 240 mg by mouth daily.    Marland Kitchen docusate sodium (COLACE) 50 MG capsule Take by mouth daily as needed (constipation).     . fluticasone (FLONASE) 50 MCG/ACT nasal spray Place 2 sprays into the nose daily as needed for allergies (nasal drip).     . furosemide (LASIX) 40 MG tablet Take 20 mg by mouth as needed for fluid.     Marland Kitchen HYDROcodone-acetaminophen (NORCO/VICODIN) 5-325 MG per tablet Take 1-2 tablets by mouth every 4 (four) hours as needed (breakthrough pain). 50 tablet 0  . levothyroxine (SYNTHROID, LEVOTHROID) 25 MCG tablet TAKE 1 TABLET BY MOUTH DAILY 30 tablet 3  . Menthol, Topical Analgesic, (BENGAY EX) Apply 1 application topically 2 (two) times daily as needed (leg pain).    . metoprolol succinate (TOPROL-XL) 100 MG 24 hr tablet Take 1 tablet (100 mg total) by mouth daily. Take with or immediately following a meal. 90 tablet 3  . Multiple Vitamins-Minerals (ICAPS PO) Take 1 capsule by  mouth 2 (two) times daily.     Marland Kitchen OVER THE COUNTER MEDICATION Take by mouth daily as needed (nasal drip). Tylenol Cold - Mucous Severe (dextromethorphan, acetaminophen, phenylephrine, guaifenesin) - takes early morning if she wakes up with a nasal drip (1 swig)    . warfarin (COUMADIN) 4 MG tablet USE AS DIRECTED BY COUMADIN CLINIC 30 tablet 3   No current facility-administered medications for this visit.     Past Medical History  Diagnosis Date  . HTN (hypertension)   . Hyperlipidemia   . Hypothyroidism   . Sinoatrial node dysfunction (HCC)   . Aortic valve disorder   . Osteoporosis     , severe, hypercalciuria, Boniva since 2005, T10 compression fracture 2009  . Goiter   . Polymyalgia rheumatica ([place])   . Carpal tunnel syndrome   . Other disorder of calcium metabolism   . Macular degeneration   . DJD (degenerative joint disease)   . Atrial fibrillation (HCC)     paroxysmal, status post successful vessel cardioversion 5/09, skains,transient left ventricular systolic dysfunction, likely secondary to cardioversion/stunning of the myocardium, no evidence significant CAD, echo 7/09 shows normal ejection fraction  . Mitral regurgitation   . Chronic anticoagulation   . Presence of permanent cardiac pacemaker   . Dysrhythmia   . Sinus drainage     in the morning, clear drainage  . GERD (gastroesophageal reflux disease)     OTC  occasionally    ROS:   All systems reviewed and negative except as noted in the HPI.   Past Surgical History  Procedure Laterality Date  . Pacemaker insertion      Dual chamber permanent pacer insertion for tachy brady syndrome, 01/2009  . Femur fracture surgery    . Dilation and curettage of uterus    . Tonsillectomy and adenoidectomy    . Carpal tunnel release    . Total knee arthroplasty    . Eye surgery Bilateral     cataracts with lens   . Total hip arthroplasty Right 12/16/2014    Procedure: RIGHT TOTAL HIP ARTHROPLASTY ANTERIOR APPROACH;   Surgeon: Hessie Dibble, MD;  Location: [facility];  Service: Orthopedics;  Laterality: Right;     Family History  Problem Relation Age of Onset  . Cancer Sister   . Diabetes Sister   . Hypertension Mother   . Hypertension Father      Social History   Social History  . Marital Status: Widowed    Spouse Name: N/A  . Number of Children: N/A  . Years of Education: N/A   Occupational History  . Not on file.   Social History Main Topics  . Smoking status: Never Smoker   . Smokeless tobacco: Never Used  . Alcohol Use: Yes     Comment: wine and beer occasionally  . Drug Use: No  . Sexual Activity: Not on file   Other Topics Concern  . Not on file   Social History Narrative     BP 128/66 mmHg  Pulse 84  Ht 5\' 7"  (1.702 m)  Wt 142 lb 4.8 oz (64.547 kg)  BMI 22.28 kg/m2  Physical Exam:  Well appearing elderly woman, NAD HEENT: Unremarkable Neck:  No JVD, no thyromegally Back:  No CVA tenderness Lungs:  Clear with no wheezes, rales, or rhonchi HEART:  IRegular rate rhythm, grade 1/6 systolic murmur, no rubs, no clicks Abd:  soft, positive bowel sounds, no organomegally, no rebound, no guarding Ext:  2 plus pulses, no edema, no cyanosis, no clubbing Skin:  No rashes no nodules Neuro:  CN II through XII intact, motor grossly intact    DEVICE  Normal device function.  See PaceArt for details.   Assess/Plan: 1. Atrial fib - her ventricular rate is well controlled. Will follow. 2. HTN - her blood pressure is well controlled. Will follow. 3. Sinus node dysfunction - she is s/p PPM and her device is working normally and she is asymptomatic.  Mikle Bosworth.D.

## 2015-12-31 NOTE — Patient Instructions (Signed)
Medication Instructions:  Your physician recommends that you continue on your current medications as directed. Please refer to the Current Medication list given to you today.   Labwork: None ordered   Testing/Procedures: None ordered   Follow-Up: Your physician wants you to follow-up in: 12 months with Dr Knox Saliva will receive a reminder letter in the mail two months in advance. If you don't receive a letter, please call our office to schedule the follow-up appointment.  Remote monitoring is used to monitor your Pacemaker  from home. This monitoring reduces the number of office visits required to check your device to one time per year. It allows [place] to keep an eye on the functioning of your device to ensure it is working properly. You are scheduled for a device check from home on 03/31/16. You may send your transmission at any time that day. If you have a wireless device, the transmission will be sent automatically. After your physician reviews your transmission, you will receive a postcard with your next transmission date.     Any Other Special Instructions Will Be Listed Below (If Applicable).     If you need a refill on your cardiac medications before your next appointment, please call your pharmacy.

## 2016-01-27 DIAGNOSIS — H35421 Microcystoid degeneration of retina, right eye: Secondary | ICD-10-CM | POA: Diagnosis not present

## 2016-01-27 DIAGNOSIS — H43813 Vitreous degeneration, bilateral: Secondary | ICD-10-CM | POA: Diagnosis not present

## 2016-01-27 DIAGNOSIS — H353231 Exudative age-related macular degeneration, bilateral, with active choroidal neovascularization: Secondary | ICD-10-CM | POA: Diagnosis not present

## 2016-02-10 ENCOUNTER — Ambulatory Visit (INDEPENDENT_AMBULATORY_CARE_PROVIDER_SITE_OTHER): Payer: Medicare Other | Admitting: Pharmacist

## 2016-02-10 DIAGNOSIS — Z7901 Long term (current) use of anticoagulants: Secondary | ICD-10-CM | POA: Diagnosis not present

## 2016-02-10 DIAGNOSIS — Z5181 Encounter for therapeutic drug level monitoring: Secondary | ICD-10-CM | POA: Diagnosis not present

## 2016-02-10 DIAGNOSIS — I4891 Unspecified atrial fibrillation: Secondary | ICD-10-CM | POA: Diagnosis not present

## 2016-02-10 DIAGNOSIS — I481 Persistent atrial fibrillation: Secondary | ICD-10-CM | POA: Diagnosis not present

## 2016-02-10 DIAGNOSIS — I4819 Other persistent atrial fibrillation: Secondary | ICD-10-CM

## 2016-02-10 LAB — POCT INR: INR: 2.1

## 2016-02-22 ENCOUNTER — Encounter: Payer: Self-pay | Admitting: Cardiology

## 2016-02-22 ENCOUNTER — Ambulatory Visit (INDEPENDENT_AMBULATORY_CARE_PROVIDER_SITE_OTHER): Payer: Medicare Other | Admitting: Cardiology

## 2016-02-22 VITALS — BP 118/72 | HR 89 | Ht 66.0 in | Wt 146.1 lb

## 2016-02-22 DIAGNOSIS — Z7901 Long term (current) use of anticoagulants: Secondary | ICD-10-CM | POA: Diagnosis not present

## 2016-02-22 DIAGNOSIS — I482 Chronic atrial fibrillation, unspecified: Secondary | ICD-10-CM

## 2016-02-22 DIAGNOSIS — Z950 Presence of cardiac pacemaker: Secondary | ICD-10-CM | POA: Diagnosis not present

## 2016-02-22 DIAGNOSIS — I10 Essential (primary) hypertension: Secondary | ICD-10-CM

## 2016-02-22 DIAGNOSIS — I359 Nonrheumatic aortic valve disorder, unspecified: Secondary | ICD-10-CM

## 2016-02-22 NOTE — Patient Instructions (Signed)

## 2016-02-22 NOTE — Progress Notes (Signed)
[name]. [address] [place],   [address] Phone: [phone] Fax:  [phone]  Date:  02/22/2016   ID:  Patricia, Holt Jul 28, 1924, MRN [id number]  PCP:  Irven Shelling, MD   History of Present Illness: Patricia Holt is a 82 y.o. female persistent atrial fibrillation, pacemaker dual chamber because of prior tachycardia bradycardia syndrome in April of 2010, chronic anticoagulation with recent bout of cardiomyopathy following cardioversion that spontaneously resolved here for followup.  On 11/30/11-her lisinopril was stopped because of chronic cough. Losartan was started. Occasional cough still persisted. Aortic valve showed moderate aortic insufficiency. 8/11 echocardiogram. Furosemide 40 mg was started at last clinic visit to be taken on as-needed basis if weight increased.  Loosing balance so stopped golf. Walking daily. No changes with breathing.   09/17/14 - has occasional feeling of dizziness. Overall asymptomatic. No shortness of breath, no chest pain. Doing quite well. About to undergo right hip operation in January.  09/09/15 - right hip replacement, Left knee replacement  Patricia Holt, her son, birthday party, 49. She is having some lower extremity edema post hip surgery, knee injection. Otherwise doing well. No chest pain, no shortness of breath. Equilibrium issues prohibit her from playing golf. She still goes out and plays bunco.   02/22/16 - Dr. Lovena Le visit reviewed - PACER, AFIB chronic. Pacer placed  2010. AFIB started in 2009. Mild aortic regurgitation. No CP, no SOB. Mucus, nasal drip. Edema in feet.   Wedding with her grandson in [place] going to go soon.   Wt Readings from Last 3 Encounters:  02/22/16 146 lb 1.9 oz (66.28 kg)  12/31/15 142 lb 4.8 oz (64.547 kg)  09/09/15 155 lb 6.4 oz (70.489 kg)     Past Medical History  Diagnosis Date  . HTN (hypertension)   . Hyperlipidemia   . Hypothyroidism   . Sinoatrial node dysfunction  (HCC)   . Aortic valve disorder   . Osteoporosis     , severe, hypercalciuria, Boniva since 2005, T10 compression fracture 2009  . Goiter   . Polymyalgia rheumatica ([place])   . Carpal tunnel syndrome   . Other disorder of calcium metabolism   . Macular degeneration   . DJD (degenerative joint disease)   . Atrial fibrillation (HCC)     paroxysmal, status post successful vessel cardioversion 5/09, Aliceson Dolbow,transient left ventricular systolic dysfunction, likely secondary to cardioversion/stunning of the myocardium, no evidence significant CAD, echo 7/09 shows normal ejection fraction  . Mitral regurgitation   . Chronic anticoagulation   . Presence of permanent cardiac pacemaker   . Dysrhythmia   . Sinus drainage     in the morning, clear drainage  . GERD (gastroesophageal reflux disease)     OTC occasionally    Past Surgical History  Procedure Laterality Date  . Pacemaker insertion      Dual chamber permanent pacer insertion for tachy brady syndrome, 01/2009  . Femur fracture surgery    . Dilation and curettage of uterus    . Tonsillectomy and adenoidectomy    . Carpal tunnel release    . Total knee arthroplasty    . Eye surgery Bilateral     cataracts with lens   . Total hip arthroplasty Right 12/16/2014    Procedure: RIGHT TOTAL HIP ARTHROPLASTY ANTERIOR APPROACH;  Surgeon: Hessie Dibble, MD;  Location: [facility];  Service: Orthopedics;  Laterality: Right;    Current Outpatient Prescriptions  Medication Sig Dispense Refill  .  acetaminophen (TYLENOL) 500 MG tablet Take 500 mg by mouth every 8 (eight) hours as needed (pain).    . CALCIUM PO Take 500 mg by mouth 2 (two) times daily.    . Cholecalciferol (VITAMIN D PO) Take 1 tablet by mouth daily.    Marland Kitchen Dextromethorphan-Guaifenesin 10-200 MG CAPS Take 1 tablet by mouth 2 (two) times daily as needed (cold symptoms).    Marland Kitchen diltiazem (TIAZAC) 240 MG 24 hr capsule Take 240 mg by mouth daily.    Marland Kitchen docusate sodium (COLACE) 50 MG capsule  Take by mouth daily as needed (constipation).     . fluticasone (FLONASE) 50 MCG/ACT nasal spray Place 2 sprays into the nose daily as needed for allergies (nasal drip).     . furosemide (LASIX) 40 MG tablet Take 20 mg by mouth as needed for fluid.     Marland Kitchen HYDROcodone-acetaminophen (NORCO/VICODIN) 5-325 MG per tablet Take 1-2 tablets by mouth every 4 (four) hours as needed (breakthrough pain). 50 tablet 0  . levothyroxine (SYNTHROID, LEVOTHROID) 25 MCG tablet TAKE 1 TABLET BY MOUTH DAILY 30 tablet 3  . Menthol, Topical Analgesic, (BENGAY EX) Apply 1 application topically 2 (two) times daily as needed (leg pain).    . metoprolol succinate (TOPROL-XL) 100 MG 24 hr tablet Take 1 tablet (100 mg total) by mouth daily. Take with or immediately following a meal. 90 tablet 3  . Multiple Vitamins-Minerals (ICAPS PO) Take 1 capsule by mouth 2 (two) times daily.     Marland Kitchen OVER THE COUNTER MEDICATION Take by mouth daily as needed (nasal drip). Tylenol Cold - Mucous Severe (dextromethorphan, acetaminophen, phenylephrine, guaifenesin) - takes early morning if she wakes up with a nasal drip (1 swig)    . warfarin (COUMADIN) 4 MG tablet USE AS DIRECTED BY COUMADIN CLINIC 30 tablet 3   No current facility-administered medications for this visit.    Allergies:   No Known Allergies  Social History:  The patient  reports that she has never smoked. She has never used smokeless tobacco. She reports that she drinks alcohol. She reports that she does not use illicit drugs.   ROS:  Please see the history of present illness.   Knee pain when walking. Using cane. Has some balance issues so she does not play golf. No chest pain, no shortness of breath. No bleeding with Coumadin. No strokelike symptoms.   All other systems reviewed and negative.   PHYSICAL EXAM: VS:  BP 118/72 mmHg  Pulse 89  Ht 5\' 6"  (1.676 m)  Wt 146 lb 1.9 oz (66.28 kg)  BMI 23.60 kg/m2 Well nourished, well developed, in no acute distress HEENT:  normal Neck: no JVD Cardiac:  Irregularly irregular, normal rate; 1/6 diastolic murmur right upper sternal border Lungs:  clear to auscultation bilaterally, no wheezing, rhonchi or rales Abd: soft, nontender, no hepatomegaly Ext: no edema Skin: warm and dry Neuro: no focal abnormalities noted  EKG: Today 09/09/15-atrial fibrillation heart rate 95 bpm, isolated ventricular paced beat. 09/17/14- Atrial fibrillation 94, left axis deviation,no ST segment changes, no significant change from prior  ASSESSMENT AND PLAN:  1. Pacemaker - Dr. Lovena Le, 2011. Stable. 3 years till ERI 2. Atrial fibrillation-persistent, chronic. Doing well. Well rate controlled on current medications. No changes made. CHADS-VASc - 4 (hypertension, age, female) no symptoms. Contemplated decreasing her diltiazem to maybe help improve her lower extremity edema however she has had ventricular high rates on pacemaker. We will continue. 3. Chronic anticoagulation-no bleeding. Continue to monitor closely. Monitored  here. 4. Hypertension-currently on losartan. Doing well. Prior ACE-I cough.  5. Aortic regurgitation-mild to moderate. No change clinically. No need for repeat echocardiogram at this time. 6. Pedal edema-she is taking furosemide, Dr. Laurann Montana. Support hose recommended, she does have them. Elevate legs. In the morning, nasal congestion, mucus.  Signed, Candee Furbish, MD [facility]  02/22/2016 9:28 AM

## 2016-03-30 ENCOUNTER — Ambulatory Visit (INDEPENDENT_AMBULATORY_CARE_PROVIDER_SITE_OTHER): Payer: Medicare Other | Admitting: Pharmacist

## 2016-03-30 DIAGNOSIS — Z5181 Encounter for therapeutic drug level monitoring: Secondary | ICD-10-CM

## 2016-03-30 DIAGNOSIS — Z7901 Long term (current) use of anticoagulants: Secondary | ICD-10-CM

## 2016-03-30 DIAGNOSIS — I4891 Unspecified atrial fibrillation: Secondary | ICD-10-CM

## 2016-03-30 LAB — POCT INR: INR: 1.6

## 2016-03-31 ENCOUNTER — Ambulatory Visit (INDEPENDENT_AMBULATORY_CARE_PROVIDER_SITE_OTHER): Payer: Medicare Other | Admitting: *Deleted

## 2016-03-31 DIAGNOSIS — I495 Sick sinus syndrome: Secondary | ICD-10-CM

## 2016-04-01 NOTE — Progress Notes (Signed)
Remote pacemaker transmission.   

## 2016-04-04 DIAGNOSIS — Z961 Presence of intraocular lens: Secondary | ICD-10-CM | POA: Diagnosis not present

## 2016-04-04 DIAGNOSIS — H353131 Nonexudative age-related macular degeneration, bilateral, early dry stage: Secondary | ICD-10-CM | POA: Diagnosis not present

## 2016-04-14 ENCOUNTER — Other Ambulatory Visit: Payer: Self-pay | Admitting: Cardiology

## 2016-04-14 LAB — CUP PACEART REMOTE DEVICE CHECK
Battery Impedance: 2142 Ohm
Battery Remaining Longevity: 37 mo
Battery Voltage: 2.75 V
Brady Statistic RV Percent Paced: 30 %
Date Time Interrogation Session: 20170601115501
Implantable Lead Implant Date: 20100409
Implantable Lead Implant Date: 20100409
Implantable Lead Location: 753859
Implantable Lead Location: 753860
Implantable Lead Model: 5076
Implantable Lead Model: 5092
Lead Channel Impedance Value: 67 Ohm
Lead Channel Impedance Value: 700 Ohm
Lead Channel Setting Pacing Amplitude: 2 V
Lead Channel Setting Pacing Pulse Width: 0.4 ms
Lead Channel Setting Sensing Sensitivity: 5.6 mV

## 2016-04-20 ENCOUNTER — Encounter: Payer: Self-pay | Admitting: Cardiology

## 2016-04-20 DIAGNOSIS — H353231 Exudative age-related macular degeneration, bilateral, with active choroidal neovascularization: Secondary | ICD-10-CM | POA: Diagnosis not present

## 2016-04-20 DIAGNOSIS — H43813 Vitreous degeneration, bilateral: Secondary | ICD-10-CM | POA: Diagnosis not present

## 2016-04-20 DIAGNOSIS — H35421 Microcystoid degeneration of retina, right eye: Secondary | ICD-10-CM | POA: Diagnosis not present

## 2016-04-27 ENCOUNTER — Ambulatory Visit (INDEPENDENT_AMBULATORY_CARE_PROVIDER_SITE_OTHER): Payer: Medicare Other | Admitting: *Deleted

## 2016-04-27 DIAGNOSIS — Z7901 Long term (current) use of anticoagulants: Secondary | ICD-10-CM

## 2016-04-27 DIAGNOSIS — Z5181 Encounter for therapeutic drug level monitoring: Secondary | ICD-10-CM

## 2016-04-27 DIAGNOSIS — I4891 Unspecified atrial fibrillation: Secondary | ICD-10-CM | POA: Diagnosis not present

## 2016-04-27 LAB — POCT INR: INR: 2

## 2016-05-11 DIAGNOSIS — H5371 Glare sensitivity: Secondary | ICD-10-CM | POA: Diagnosis not present

## 2016-05-11 DIAGNOSIS — H53419 Scotoma involving central area, unspecified eye: Secondary | ICD-10-CM | POA: Diagnosis not present

## 2016-05-11 DIAGNOSIS — H35421 Microcystoid degeneration of retina, right eye: Secondary | ICD-10-CM | POA: Diagnosis not present

## 2016-05-11 DIAGNOSIS — H353231 Exudative age-related macular degeneration, bilateral, with active choroidal neovascularization: Secondary | ICD-10-CM | POA: Diagnosis not present

## 2016-05-11 DIAGNOSIS — H542 Low vision, both eyes: Secondary | ICD-10-CM | POA: Diagnosis not present

## 2016-05-17 DIAGNOSIS — H353231 Exudative age-related macular degeneration, bilateral, with active choroidal neovascularization: Secondary | ICD-10-CM | POA: Diagnosis not present

## 2016-05-17 DIAGNOSIS — H5371 Glare sensitivity: Secondary | ICD-10-CM | POA: Diagnosis not present

## 2016-05-17 DIAGNOSIS — H53419 Scotoma involving central area, unspecified eye: Secondary | ICD-10-CM | POA: Diagnosis not present

## 2016-05-17 DIAGNOSIS — H542 Low vision, both eyes: Secondary | ICD-10-CM | POA: Diagnosis not present

## 2016-05-17 DIAGNOSIS — H35421 Microcystoid degeneration of retina, right eye: Secondary | ICD-10-CM | POA: Diagnosis not present

## 2016-05-24 DIAGNOSIS — H35421 Microcystoid degeneration of retina, right eye: Secondary | ICD-10-CM | POA: Diagnosis not present

## 2016-05-24 DIAGNOSIS — H5371 Glare sensitivity: Secondary | ICD-10-CM | POA: Diagnosis not present

## 2016-05-24 DIAGNOSIS — H53419 Scotoma involving central area, unspecified eye: Secondary | ICD-10-CM | POA: Diagnosis not present

## 2016-05-24 DIAGNOSIS — H353231 Exudative age-related macular degeneration, bilateral, with active choroidal neovascularization: Secondary | ICD-10-CM | POA: Diagnosis not present

## 2016-05-24 DIAGNOSIS — H542 Low vision, both eyes: Secondary | ICD-10-CM | POA: Diagnosis not present

## 2016-05-25 ENCOUNTER — Ambulatory Visit (INDEPENDENT_AMBULATORY_CARE_PROVIDER_SITE_OTHER): Payer: Medicare Other | Admitting: *Deleted

## 2016-05-25 ENCOUNTER — Encounter (INDEPENDENT_AMBULATORY_CARE_PROVIDER_SITE_OTHER): Payer: Self-pay

## 2016-05-25 DIAGNOSIS — I4891 Unspecified atrial fibrillation: Secondary | ICD-10-CM

## 2016-05-25 DIAGNOSIS — Z5181 Encounter for therapeutic drug level monitoring: Secondary | ICD-10-CM

## 2016-05-25 DIAGNOSIS — Z7901 Long term (current) use of anticoagulants: Secondary | ICD-10-CM

## 2016-05-25 LAB — POCT INR: INR: 1.6

## 2016-06-01 DIAGNOSIS — I482 Chronic atrial fibrillation: Secondary | ICD-10-CM | POA: Diagnosis not present

## 2016-06-01 DIAGNOSIS — I10 Essential (primary) hypertension: Secondary | ICD-10-CM | POA: Diagnosis not present

## 2016-06-01 DIAGNOSIS — M810 Age-related osteoporosis without current pathological fracture: Secondary | ICD-10-CM | POA: Diagnosis not present

## 2016-06-01 DIAGNOSIS — Z1389 Encounter for screening for other disorder: Secondary | ICD-10-CM | POA: Diagnosis not present

## 2016-06-07 DIAGNOSIS — H53419 Scotoma involving central area, unspecified eye: Secondary | ICD-10-CM | POA: Diagnosis not present

## 2016-06-07 DIAGNOSIS — H542 Low vision, both eyes: Secondary | ICD-10-CM | POA: Diagnosis not present

## 2016-06-07 DIAGNOSIS — H5371 Glare sensitivity: Secondary | ICD-10-CM | POA: Diagnosis not present

## 2016-06-07 DIAGNOSIS — H353231 Exudative age-related macular degeneration, bilateral, with active choroidal neovascularization: Secondary | ICD-10-CM | POA: Diagnosis not present

## 2016-06-07 DIAGNOSIS — H35421 Microcystoid degeneration of retina, right eye: Secondary | ICD-10-CM | POA: Diagnosis not present

## 2016-06-14 DIAGNOSIS — H53419 Scotoma involving central area, unspecified eye: Secondary | ICD-10-CM | POA: Diagnosis not present

## 2016-06-14 DIAGNOSIS — H542 Low vision, both eyes: Secondary | ICD-10-CM | POA: Diagnosis not present

## 2016-06-14 DIAGNOSIS — H353231 Exudative age-related macular degeneration, bilateral, with active choroidal neovascularization: Secondary | ICD-10-CM | POA: Diagnosis not present

## 2016-06-14 DIAGNOSIS — H5371 Glare sensitivity: Secondary | ICD-10-CM | POA: Diagnosis not present

## 2016-06-14 DIAGNOSIS — H35421 Microcystoid degeneration of retina, right eye: Secondary | ICD-10-CM | POA: Diagnosis not present

## 2016-06-22 ENCOUNTER — Ambulatory Visit (INDEPENDENT_AMBULATORY_CARE_PROVIDER_SITE_OTHER): Payer: Medicare Other | Admitting: Pharmacist

## 2016-06-22 DIAGNOSIS — Z5181 Encounter for therapeutic drug level monitoring: Secondary | ICD-10-CM

## 2016-06-22 DIAGNOSIS — Z7901 Long term (current) use of anticoagulants: Secondary | ICD-10-CM

## 2016-06-22 DIAGNOSIS — I4891 Unspecified atrial fibrillation: Secondary | ICD-10-CM | POA: Diagnosis not present

## 2016-06-22 LAB — POCT INR: INR: 2.4

## 2016-06-24 DIAGNOSIS — H5371 Glare sensitivity: Secondary | ICD-10-CM | POA: Diagnosis not present

## 2016-06-24 DIAGNOSIS — H542 Low vision, both eyes: Secondary | ICD-10-CM | POA: Diagnosis not present

## 2016-06-24 DIAGNOSIS — H35421 Microcystoid degeneration of retina, right eye: Secondary | ICD-10-CM | POA: Diagnosis not present

## 2016-06-24 DIAGNOSIS — H353231 Exudative age-related macular degeneration, bilateral, with active choroidal neovascularization: Secondary | ICD-10-CM | POA: Diagnosis not present

## 2016-06-24 DIAGNOSIS — H53419 Scotoma involving central area, unspecified eye: Secondary | ICD-10-CM | POA: Diagnosis not present

## 2016-06-30 ENCOUNTER — Ambulatory Visit (INDEPENDENT_AMBULATORY_CARE_PROVIDER_SITE_OTHER): Payer: Medicare Other | Admitting: *Deleted

## 2016-06-30 ENCOUNTER — Telehealth: Payer: Self-pay | Admitting: Cardiology

## 2016-06-30 DIAGNOSIS — I495 Sick sinus syndrome: Secondary | ICD-10-CM

## 2016-06-30 NOTE — Telephone Encounter (Signed)
LMOVM reminding pt to send remote transmission.   

## 2016-07-01 ENCOUNTER — Encounter: Payer: Self-pay | Admitting: Cardiology

## 2016-07-01 NOTE — Progress Notes (Signed)
Remote pacemaker transmission.   

## 2016-07-05 LAB — CUP PACEART REMOTE DEVICE CHECK
Battery Impedance: 2284 Ohm
Battery Remaining Longevity: 34 mo
Battery Voltage: 2.75 V
Brady Statistic RV Percent Paced: 29 %
Date Time Interrogation Session: 20170901125412
Implantable Lead Implant Date: 20100409
Implantable Lead Implant Date: 20100409
Implantable Lead Location: 753859
Implantable Lead Location: 753860
Implantable Lead Model: 5076
Implantable Lead Model: 5092
Lead Channel Impedance Value: 67 Ohm
Lead Channel Impedance Value: 758 Ohm
Lead Channel Pacing Threshold Amplitude: 1.125 V
Lead Channel Pacing Threshold Pulse Width: 0.4 ms
Lead Channel Setting Pacing Amplitude: 2.25 V
Lead Channel Setting Pacing Pulse Width: 0.4 ms
Lead Channel Setting Sensing Sensitivity: 5.6 mV

## 2016-07-20 ENCOUNTER — Ambulatory Visit (INDEPENDENT_AMBULATORY_CARE_PROVIDER_SITE_OTHER): Payer: Medicare Other | Admitting: *Deleted

## 2016-07-20 ENCOUNTER — Encounter (INDEPENDENT_AMBULATORY_CARE_PROVIDER_SITE_OTHER): Payer: Self-pay

## 2016-07-20 DIAGNOSIS — Z7901 Long term (current) use of anticoagulants: Secondary | ICD-10-CM

## 2016-07-20 DIAGNOSIS — I4891 Unspecified atrial fibrillation: Secondary | ICD-10-CM

## 2016-07-20 DIAGNOSIS — Z5181 Encounter for therapeutic drug level monitoring: Secondary | ICD-10-CM

## 2016-07-20 LAB — POCT INR: INR: 2.1

## 2016-08-06 ENCOUNTER — Other Ambulatory Visit: Payer: Self-pay | Admitting: Cardiology

## 2016-08-17 DIAGNOSIS — H35421 Microcystoid degeneration of retina, right eye: Secondary | ICD-10-CM | POA: Diagnosis not present

## 2016-08-17 DIAGNOSIS — H43813 Vitreous degeneration, bilateral: Secondary | ICD-10-CM | POA: Diagnosis not present

## 2016-08-17 DIAGNOSIS — H353232 Exudative age-related macular degeneration, bilateral, with inactive choroidal neovascularization: Secondary | ICD-10-CM | POA: Diagnosis not present

## 2016-08-24 ENCOUNTER — Ambulatory Visit (INDEPENDENT_AMBULATORY_CARE_PROVIDER_SITE_OTHER): Payer: Medicare Other | Admitting: *Deleted

## 2016-08-24 DIAGNOSIS — I4891 Unspecified atrial fibrillation: Secondary | ICD-10-CM | POA: Diagnosis not present

## 2016-08-24 DIAGNOSIS — Z7901 Long term (current) use of anticoagulants: Secondary | ICD-10-CM | POA: Diagnosis not present

## 2016-08-24 DIAGNOSIS — Z5181 Encounter for therapeutic drug level monitoring: Secondary | ICD-10-CM

## 2016-08-24 LAB — POCT INR: INR: 1.6

## 2016-09-07 ENCOUNTER — Encounter: Payer: Self-pay | Admitting: Cardiology

## 2016-09-07 ENCOUNTER — Ambulatory Visit (INDEPENDENT_AMBULATORY_CARE_PROVIDER_SITE_OTHER): Payer: Medicare Other | Admitting: Cardiology

## 2016-09-07 ENCOUNTER — Ambulatory Visit (INDEPENDENT_AMBULATORY_CARE_PROVIDER_SITE_OTHER): Payer: Medicare Other | Admitting: *Deleted

## 2016-09-07 ENCOUNTER — Encounter (INDEPENDENT_AMBULATORY_CARE_PROVIDER_SITE_OTHER): Payer: Self-pay

## 2016-09-07 VITALS — BP 128/72 | HR 79 | Ht 67.0 in | Wt 153.0 lb

## 2016-09-07 DIAGNOSIS — I481 Persistent atrial fibrillation: Secondary | ICD-10-CM

## 2016-09-07 DIAGNOSIS — I4891 Unspecified atrial fibrillation: Secondary | ICD-10-CM

## 2016-09-07 DIAGNOSIS — Z7901 Long term (current) use of anticoagulants: Secondary | ICD-10-CM

## 2016-09-07 DIAGNOSIS — I359 Nonrheumatic aortic valve disorder, unspecified: Secondary | ICD-10-CM | POA: Diagnosis not present

## 2016-09-07 DIAGNOSIS — Z5181 Encounter for therapeutic drug level monitoring: Secondary | ICD-10-CM

## 2016-09-07 DIAGNOSIS — I4819 Other persistent atrial fibrillation: Secondary | ICD-10-CM

## 2016-09-07 LAB — POCT INR: INR: 1.6

## 2016-09-07 NOTE — Patient Instructions (Signed)

## 2016-09-07 NOTE — Progress Notes (Signed)
[facility]. [address] [place], [place]  [address] Phone: [phone] Fax:  [phone]  Date:  09/07/2016   ID:  Patricia, Holt 23-Feb-1924, MRN [id number]  PCP:  Irven Shelling, MD   History of Present Illness: Patricia Holt is a 82 y.o. female persistent atrial fibrillation, pacemaker dual chamber because of prior tachycardia bradycardia syndrome in April of 2010, chronic anticoagulation with bout of cardiomyopathy following cardioversion that spontaneously resolved here for followup.  On 11/30/11-her lisinopril was stopped because of chronic cough. Occasional cough still persisted. Aortic valve showed moderate aortic insufficiency. 8/11 echocardiogram. Furosemide 40 mg to be taken on as-needed basis if weight increased.  Loosing balance so stopped golf. Walking daily. No changes with breathing.   Sometimes when she wakes up in the morning she may have some mild wheezing.  She enjoyed going to [employer], [employer], weding of her grandson.   Wt Readings from Last 3 Encounters:  09/07/16 153 lb (69.4 kg)  02/22/16 146 lb 1.9 oz (66.3 kg)  12/31/15 142 lb 4.8 oz (64.5 kg)     Past Medical History:  Diagnosis Date  . Aortic valve disorder   . Atrial fibrillation (HCC)    paroxysmal, status post successful vessel cardioversion 5/09, Hamdi Kley,transient left ventricular systolic dysfunction, likely secondary to cardioversion/stunning of the myocardium, no evidence significant CAD, echo 7/09 shows normal ejection fraction  . Carpal tunnel syndrome   . Chronic anticoagulation   . DJD (degenerative joint disease)   . Dysrhythmia   . GERD (gastroesophageal reflux disease)    OTC occasionally  . Goiter   . HTN (hypertension)   . Hyperlipidemia   . Hypothyroidism   . Macular degeneration   . Mitral regurgitation   . Osteoporosis    , severe, hypercalciuria, Boniva since 2005, T10 compression fracture 2009  . Other disorder of calcium  metabolism   . Polymyalgia rheumatica ([place])   . Presence of permanent cardiac pacemaker   . Sinoatrial node dysfunction (HCC)   . Sinus drainage    in the morning, clear drainage    Past Surgical History:  Procedure Laterality Date  . CARPAL TUNNEL RELEASE    . DILATION AND CURETTAGE OF UTERUS    . EYE SURGERY Bilateral    cataracts with lens   . FEMUR FRACTURE SURGERY    . PACEMAKER INSERTION     Dual chamber permanent pacer insertion for tachy brady syndrome, 01/2009  . TONSILLECTOMY AND ADENOIDECTOMY    . TOTAL HIP ARTHROPLASTY Right 12/16/2014   Procedure: RIGHT TOTAL HIP ARTHROPLASTY ANTERIOR APPROACH;  Surgeon: Hessie Dibble, MD;  Location: [facility];  Service: Orthopedics;  Laterality: Right;  . TOTAL KNEE ARTHROPLASTY      Current Outpatient Prescriptions  Medication Sig Dispense Refill  . acetaminophen (TYLENOL) 500 MG tablet Take 500 mg by mouth every 8 (eight) hours as needed (pain).    . CALCIUM PO Take 500 mg by mouth 2 (two) times daily.    . Cholecalciferol (VITAMIN D PO) Take 1 tablet by mouth daily.    Marland Kitchen Dextromethorphan-Guaifenesin 10-200 MG CAPS Take 1 tablet by mouth 2 (two) times daily as needed (cold symptoms).    Marland Kitchen diltiazem (TIAZAC) 240 MG 24 hr capsule Take 240 mg by mouth daily.    Marland Kitchen docusate sodium (COLACE) 50 MG capsule Take by mouth daily as needed (constipation).     . fluticasone (FLONASE) 50 MCG/ACT nasal spray Place 2 sprays  into the nose daily as needed for allergies (nasal drip).     . furosemide (LASIX) 40 MG tablet Take 20 mg by mouth as needed for fluid.     Marland Kitchen HYDROcodone-acetaminophen (NORCO/VICODIN) 5-325 MG per tablet Take 1-2 tablets by mouth every 4 (four) hours as needed (breakthrough pain). 50 tablet 0  . levothyroxine (SYNTHROID, LEVOTHROID) 25 MCG tablet TAKE 1 TABLET BY MOUTH DAILY 30 tablet 3  . Menthol, Topical Analgesic, (BENGAY EX) Apply 1 application topically 2 (two) times daily as needed (leg pain).    . metoprolol succinate  (TOPROL-XL) 100 MG 24 hr tablet Take 1 tablet (100 mg total) by mouth daily. Take with or immediately following a meal. 90 tablet 3  . Multiple Vitamins-Minerals (ICAPS PO) Take 1 capsule by mouth 2 (two) times daily.     Marland Kitchen OVER THE COUNTER MEDICATION Take by mouth daily as needed (nasal drip). Tylenol Cold - Mucous Severe (dextromethorphan, acetaminophen, phenylephrine, guaifenesin) - takes early morning if she wakes up with a nasal drip (1 swig)    . warfarin (COUMADIN) 4 MG tablet USE AS DIRECTED BY COUMADIN CLINIC 40 tablet 3   No current facility-administered medications for this visit.     Allergies:   No Known Allergies  Social History:  The patient  reports that she has never smoked. She has never used smokeless tobacco. She reports that she drinks alcohol. She reports that she does not use drugs.   ROS:  Please see the history of present illness.   Knee pain when walking. Using cane. Has some balance issues so she does not play golf. No chest pain, no shortness of breath. No bleeding with Coumadin. No strokelike symptoms.   All other systems reviewed and negative.   PHYSICAL EXAM: VS:  BP 128/72   Pulse 79   Ht 5\' 7"  (1.702 m)   Wt 153 lb (69.4 kg)   LMP  (LMP Unknown)   BMI 23.96 kg/m  Well nourished, well developed, in no acute distress HEENT: normal Neck: no JVD Cardiac:  Irregularly irregular, normal rate; 1/6 diastolic murmur right upper sternal border Lungs:  clear to auscultation bilaterally, no wheezing, rhonchi or rales Abd: soft, nontender, no hepatomegaly Ext: no edema Skin: warm and dry Neuro: no focal abnormalities noted  EKG: Today 09/07/16-heart rate 79 bpm atrial fibrillation underlying, paced rhythm. 09/09/15-atrial fibrillation heart rate 95 bpm, isolated ventricular paced beat. 09/17/14- Atrial fibrillation 94, left axis deviation,no ST segment changes, no significant change from prior  ASSESSMENT AND PLAN:  1. Pacemaker - Dr. Lovena Le, 2011. Stable. 2  years until ERI 2. Atrial fibrillation-persistent, chronic. Doing well. Well rate controlled on current medications. No changes made. CHADS-VASc - 4 (hypertension, age, female) no symptoms. Contemplated decreasing her diltiazem in the past to maybe help improve her lower extremity edema however she has had ventricular high rates on pacemaker. We will continue. 3. Chronic anticoagulation-no bleeding. Continue to monitor closely. Monitored here. 4. Hypertension- Doing well. Prior ACE-I cough. However her cough persisted. She does have rhinorrhea. Potentially allergies. Dr. Laurann Montana has her on Flonase.  5. Aortic regurgitation-mild to moderate. No change clinically. No need for repeat echocardiogram at this time. 6. Pedal edema-she is taking furosemide, Dr. Laurann Montana. Support hose recommended, she does have them. Elevate legs. In the morning, nasal congestion, mucus. 7. Six-month follow-up  Signed, Candee Furbish, MD [facility]  09/07/2016 2:02 PM

## 2016-09-20 ENCOUNTER — Ambulatory Visit (INDEPENDENT_AMBULATORY_CARE_PROVIDER_SITE_OTHER): Payer: Medicare Other

## 2016-09-20 DIAGNOSIS — Z5181 Encounter for therapeutic drug level monitoring: Secondary | ICD-10-CM

## 2016-09-20 DIAGNOSIS — Z7901 Long term (current) use of anticoagulants: Secondary | ICD-10-CM | POA: Diagnosis not present

## 2016-09-20 DIAGNOSIS — I4891 Unspecified atrial fibrillation: Secondary | ICD-10-CM | POA: Diagnosis not present

## 2016-09-20 LAB — POCT INR: INR: 2.3

## 2016-09-26 ENCOUNTER — Other Ambulatory Visit: Payer: Self-pay | Admitting: Cardiology

## 2016-09-29 ENCOUNTER — Ambulatory Visit (INDEPENDENT_AMBULATORY_CARE_PROVIDER_SITE_OTHER): Payer: Medicare Other | Admitting: *Deleted

## 2016-09-29 DIAGNOSIS — I495 Sick sinus syndrome: Secondary | ICD-10-CM | POA: Diagnosis not present

## 2016-09-29 NOTE — Progress Notes (Signed)
Remote pacemaker transmission.   

## 2016-10-04 DIAGNOSIS — Z23 Encounter for immunization: Secondary | ICD-10-CM | POA: Diagnosis not present

## 2016-10-11 ENCOUNTER — Ambulatory Visit (INDEPENDENT_AMBULATORY_CARE_PROVIDER_SITE_OTHER): Payer: Medicare Other | Admitting: *Deleted

## 2016-10-11 ENCOUNTER — Encounter: Payer: Self-pay | Admitting: Cardiology

## 2016-10-11 DIAGNOSIS — Z5181 Encounter for therapeutic drug level monitoring: Secondary | ICD-10-CM | POA: Diagnosis not present

## 2016-10-11 DIAGNOSIS — I4891 Unspecified atrial fibrillation: Secondary | ICD-10-CM

## 2016-10-11 DIAGNOSIS — Z7901 Long term (current) use of anticoagulants: Secondary | ICD-10-CM | POA: Diagnosis not present

## 2016-10-11 LAB — POCT INR: INR: 2.1

## 2016-10-12 DIAGNOSIS — M810 Age-related osteoporosis without current pathological fracture: Secondary | ICD-10-CM | POA: Diagnosis not present

## 2016-11-01 LAB — CUP PACEART REMOTE DEVICE CHECK
Battery Impedance: 2418 Ohm
Battery Remaining Longevity: 33 mo
Battery Voltage: 2.74 V
Brady Statistic RV Percent Paced: 29 %
Date Time Interrogation Session: 20171130132326
Implantable Lead Implant Date: 20100409
Implantable Lead Implant Date: 20100409
Implantable Lead Location: 753859
Implantable Lead Location: 753860
Implantable Lead Model: 5076
Implantable Lead Model: 5092
Implantable Pulse Generator Implant Date: 20100409
Lead Channel Impedance Value: 67 Ohm
Lead Channel Impedance Value: 735 Ohm
Lead Channel Pacing Threshold Amplitude: 0.875 V
Lead Channel Pacing Threshold Pulse Width: 0.4 ms
Lead Channel Setting Pacing Amplitude: 2 V
Lead Channel Setting Pacing Pulse Width: 0.4 ms
Lead Channel Setting Sensing Sensitivity: 5.6 mV

## 2016-11-02 DIAGNOSIS — M810 Age-related osteoporosis without current pathological fracture: Secondary | ICD-10-CM | POA: Diagnosis not present

## 2016-11-02 DIAGNOSIS — J310 Chronic rhinitis: Secondary | ICD-10-CM | POA: Diagnosis not present

## 2016-11-02 DIAGNOSIS — I10 Essential (primary) hypertension: Secondary | ICD-10-CM | POA: Diagnosis not present

## 2016-11-02 DIAGNOSIS — Z1389 Encounter for screening for other disorder: Secondary | ICD-10-CM | POA: Diagnosis not present

## 2016-11-02 DIAGNOSIS — E039 Hypothyroidism, unspecified: Secondary | ICD-10-CM | POA: Diagnosis not present

## 2016-11-02 DIAGNOSIS — I482 Chronic atrial fibrillation: Secondary | ICD-10-CM | POA: Diagnosis not present

## 2016-11-02 DIAGNOSIS — Z0000 Encounter for general adult medical examination without abnormal findings: Secondary | ICD-10-CM | POA: Diagnosis not present

## 2016-11-30 ENCOUNTER — Ambulatory Visit (INDEPENDENT_AMBULATORY_CARE_PROVIDER_SITE_OTHER): Payer: Medicare Other | Admitting: *Deleted

## 2016-11-30 DIAGNOSIS — Z5181 Encounter for therapeutic drug level monitoring: Secondary | ICD-10-CM

## 2016-11-30 DIAGNOSIS — Z7901 Long term (current) use of anticoagulants: Secondary | ICD-10-CM | POA: Diagnosis not present

## 2016-11-30 DIAGNOSIS — H353232 Exudative age-related macular degeneration, bilateral, with inactive choroidal neovascularization: Secondary | ICD-10-CM | POA: Diagnosis not present

## 2016-11-30 DIAGNOSIS — I4891 Unspecified atrial fibrillation: Secondary | ICD-10-CM

## 2016-11-30 LAB — POCT INR: INR: 2

## 2016-12-08 ENCOUNTER — Other Ambulatory Visit: Payer: Self-pay | Admitting: Cardiology

## 2016-12-26 ENCOUNTER — Other Ambulatory Visit: Payer: Self-pay | Admitting: Cardiology

## 2017-01-11 ENCOUNTER — Ambulatory Visit (INDEPENDENT_AMBULATORY_CARE_PROVIDER_SITE_OTHER): Payer: Medicare Other | Admitting: *Deleted

## 2017-01-11 DIAGNOSIS — Z5181 Encounter for therapeutic drug level monitoring: Secondary | ICD-10-CM | POA: Diagnosis not present

## 2017-01-11 DIAGNOSIS — I4891 Unspecified atrial fibrillation: Secondary | ICD-10-CM

## 2017-01-11 DIAGNOSIS — Z7901 Long term (current) use of anticoagulants: Secondary | ICD-10-CM

## 2017-01-11 LAB — POCT INR: INR: 1.9

## 2017-01-25 ENCOUNTER — Ambulatory Visit (INDEPENDENT_AMBULATORY_CARE_PROVIDER_SITE_OTHER): Payer: Medicare Other | Admitting: Internal Medicine

## 2017-01-25 ENCOUNTER — Encounter: Payer: Self-pay | Admitting: Internal Medicine

## 2017-01-25 VITALS — BP 124/60 | HR 86 | Ht 67.0 in | Wt 156.8 lb

## 2017-01-25 DIAGNOSIS — I495 Sick sinus syndrome: Secondary | ICD-10-CM

## 2017-01-25 DIAGNOSIS — I482 Chronic atrial fibrillation, unspecified: Secondary | ICD-10-CM

## 2017-01-25 NOTE — Patient Instructions (Signed)
Remote monitoring is used to monitor your Pacemaker of ICD from home. This monitoring reduces the number of office visits required to check your device to one time per year. It allows us to keep an eye on the functioning of your device to ensure it is working properly. You are scheduled for a device check from home on 04/26/17. You may send your transmission at any time that day. If you have a wireless device, the transmission will be sent automatically. After your physician reviews your transmission, you will receive a postcard with your next transmission date.  Your physician wants you to follow-up in: 12 months with Dr. Taylor.  You will receive a reminder letter in the mail two months in advance. If you don't receive a letter, please call our office to schedule the follow-up appointment.  

## 2017-01-25 NOTE — Progress Notes (Signed)
HPI [name] returns today for ongoing evaluation and management of her PPM and now chronic atrial fibrillation. She is a very pleasant 83 yo woman who developed symptomatic bradycardia and underwent PPM in 2010. She initially developed atrial fibrillation in 2009. She has had chronic atrial fibrillation and been treated with a strategy of rate control. Despite her advanced age, she has remained active. She has known mild aortic stenosis. No syncope. No edema. She does admit to becoming more sedentary in the past year.  No Known Allergies   Current Outpatient Prescriptions  Medication Sig Dispense Refill  . acetaminophen (TYLENOL) 500 MG tablet Take 500 mg by mouth every 8 (eight) hours as needed (pain).    . CALCIUM PO Take 500 mg by mouth 2 (two) times daily.    . Cholecalciferol (VITAMIN D PO) Take 1 tablet by mouth daily.    Marland Kitchen Dextromethorphan-Guaifenesin 10-200 MG CAPS Take 1 tablet by mouth 2 (two) times daily as needed (cold symptoms).    Marland Kitchen diltiazem (TIAZAC) 240 MG 24 hr capsule Take 240 mg by mouth daily.    Marland Kitchen docusate sodium (COLACE) 50 MG capsule Take by mouth daily as needed (constipation).     . fluticasone (FLONASE) 50 MCG/ACT nasal spray Place 2 sprays into the nose daily as needed for allergies (nasal drip).     . furosemide (LASIX) 40 MG tablet Take 20 mg by mouth as needed for fluid.     Marland Kitchen HYDROcodone-acetaminophen (NORCO/VICODIN) 5-325 MG per tablet Take 1-2 tablets by mouth every 4 (four) hours as needed (breakthrough pain). 50 tablet 0  . levothyroxine (SYNTHROID, LEVOTHROID) 25 MCG tablet TAKE 1 TABLET BY MOUTH DAILY 30 tablet 3  . Menthol, Topical Analgesic, (BENGAY EX) Apply 1 application topically 2 (two) times daily as needed (leg pain).    . metoprolol succinate (TOPROL-XL) 100 MG 24 hr tablet TAKE 1 TABLET BY MOUTH EVERY DAY WITH OR IMMEDIATELY FOLLOWING A MEAL 90 tablet 2  . Multiple Vitamins-Minerals (ICAPS PO) Take 1 capsule by mouth 2 (two) times  daily.     Marland Kitchen OVER THE COUNTER MEDICATION Take by mouth daily as needed (nasal drip). Tylenol Cold - Mucous Severe (dextromethorphan, acetaminophen, phenylephrine, guaifenesin) - takes early morning if she wakes up with a nasal drip (1 swig)    . warfarin (COUMADIN) 4 MG tablet USE AS DIRECTED BY COUMADIN CLINIC 40 tablet 3   No current facility-administered medications for this visit.      Past Medical History:  Diagnosis Date  . Aortic valve disorder   . Atrial fibrillation (HCC)    paroxysmal, status post successful vessel cardioversion 5/09, skains,transient left ventricular systolic dysfunction, likely secondary to cardioversion/stunning of the myocardium, no evidence significant CAD, echo 7/09 shows normal ejection fraction  . Carpal tunnel syndrome   . Chronic anticoagulation   . DJD (degenerative joint disease)   . Dysrhythmia   . GERD (gastroesophageal reflux disease)    OTC occasionally  . Goiter   . HTN (hypertension)   . Hyperlipidemia   . Hypothyroidism   . Macular degeneration   . Mitral regurgitation   . Osteoporosis    , severe, hypercalciuria, Boniva since 2005, T10 compression fracture 2009  . Other disorder of calcium metabolism   . Polymyalgia rheumatica ([place])   . Presence of permanent cardiac pacemaker   . Sinoatrial node dysfunction (HCC)   . Sinus drainage    in the morning, clear drainage    ROS:  All systems reviewed and negative except as noted in the HPI.   Past Surgical History:  Procedure Laterality Date  . CARPAL TUNNEL RELEASE    . DILATION AND CURETTAGE OF UTERUS    . EYE SURGERY Bilateral    cataracts with lens   . FEMUR FRACTURE SURGERY    . PACEMAKER INSERTION     Dual chamber permanent pacer insertion for tachy brady syndrome, 01/2009  . TONSILLECTOMY AND ADENOIDECTOMY    . TOTAL HIP ARTHROPLASTY Right 12/16/2014   Procedure: RIGHT TOTAL HIP ARTHROPLASTY ANTERIOR APPROACH;  Surgeon: Hessie Dibble, MD;  Location: [facility];  Service:  Orthopedics;  Laterality: Right;  . TOTAL KNEE ARTHROPLASTY       Family History  Problem Relation Age of Onset  . Hypertension Mother   . Hypertension Father   . Cancer Sister   . Diabetes Sister      Social History   Social History  . Marital status: Widowed    Spouse name: N/A  . Number of children: N/A  . Years of education: N/A   Occupational History  . Not on file.   Social History Main Topics  . Smoking status: Never Smoker  . Smokeless tobacco: Never Used  . Alcohol use Yes     Comment: wine and beer occasionally  . Drug use: No  . Sexual activity: Not on file   Other Topics Concern  . Not on file   Social History Narrative  . No narrative on file     BP 124/60   Pulse 86   Ht 5\' 7"  (1.702 m)   Wt 156 lb 12.8 oz (71.1 kg)   LMP  (LMP Unknown)   SpO2 91%   BMI 24.56 kg/m   Physical Exam:  Well appearing elderly woman, NAD HEENT: Unremarkable Neck:  6 cm JVD, no thyromegally Back:  No CVA tenderness Lungs:  Clear with no wheezes, rales, or rhonchi HEART:  IRegular rate rhythm, grade 1/6 systolic murmur, no rubs, no clicks Abd:  soft, positive bowel sounds, no organomegally, no rebound, no guarding Ext:  2 plus pulses, no edema, no cyanosis, no clubbing Skin:  No rashes no nodules Neuro:  CN II through XII intact, motor grossly intact    DEVICE  Normal device function.  See PaceArt for details.   Assess/Plan: 1. Atrial fib - her ventricular rate is well controlled. Will follow. She will continue her warfarin. She is not interested in considering a NOAC. 2. HTN - her blood pressure is well controlled. Will follow. 3. Sinus node dysfunction - she is s/p PPM and her device is working normally and she is asymptomatic.  Mikle Bosworth.D.

## 2017-02-03 DIAGNOSIS — D2239 Melanocytic nevi of other parts of face: Secondary | ICD-10-CM | POA: Diagnosis not present

## 2017-02-03 DIAGNOSIS — D485 Neoplasm of uncertain behavior of skin: Secondary | ICD-10-CM | POA: Diagnosis not present

## 2017-02-03 DIAGNOSIS — L570 Actinic keratosis: Secondary | ICD-10-CM | POA: Diagnosis not present

## 2017-02-15 DIAGNOSIS — H43813 Vitreous degeneration, bilateral: Secondary | ICD-10-CM | POA: Diagnosis not present

## 2017-02-15 DIAGNOSIS — H353232 Exudative age-related macular degeneration, bilateral, with inactive choroidal neovascularization: Secondary | ICD-10-CM | POA: Diagnosis not present

## 2017-02-15 DIAGNOSIS — H35421 Microcystoid degeneration of retina, right eye: Secondary | ICD-10-CM | POA: Diagnosis not present

## 2017-02-22 ENCOUNTER — Ambulatory Visit (INDEPENDENT_AMBULATORY_CARE_PROVIDER_SITE_OTHER): Payer: Medicare Other | Admitting: *Deleted

## 2017-02-22 DIAGNOSIS — I4891 Unspecified atrial fibrillation: Secondary | ICD-10-CM | POA: Diagnosis not present

## 2017-02-22 DIAGNOSIS — Z7901 Long term (current) use of anticoagulants: Secondary | ICD-10-CM | POA: Diagnosis not present

## 2017-02-22 DIAGNOSIS — Z5181 Encounter for therapeutic drug level monitoring: Secondary | ICD-10-CM | POA: Diagnosis not present

## 2017-02-22 LAB — POCT INR: INR: 3

## 2017-03-08 ENCOUNTER — Encounter (INDEPENDENT_AMBULATORY_CARE_PROVIDER_SITE_OTHER): Payer: Self-pay

## 2017-03-08 ENCOUNTER — Encounter: Payer: Self-pay | Admitting: Cardiology

## 2017-03-08 ENCOUNTER — Ambulatory Visit (INDEPENDENT_AMBULATORY_CARE_PROVIDER_SITE_OTHER): Payer: Medicare Other | Admitting: Cardiology

## 2017-03-08 VITALS — BP 130/80 | HR 88 | Ht 67.0 in | Wt 152.4 lb

## 2017-03-08 DIAGNOSIS — I481 Persistent atrial fibrillation: Secondary | ICD-10-CM | POA: Diagnosis not present

## 2017-03-08 DIAGNOSIS — I4819 Other persistent atrial fibrillation: Secondary | ICD-10-CM

## 2017-03-08 DIAGNOSIS — Z7901 Long term (current) use of anticoagulants: Secondary | ICD-10-CM | POA: Diagnosis not present

## 2017-03-08 DIAGNOSIS — I10 Essential (primary) hypertension: Secondary | ICD-10-CM

## 2017-03-08 DIAGNOSIS — I359 Nonrheumatic aortic valve disorder, unspecified: Secondary | ICD-10-CM

## 2017-03-08 DIAGNOSIS — Z950 Presence of cardiac pacemaker: Secondary | ICD-10-CM

## 2017-03-08 NOTE — Progress Notes (Signed)
[name] [address] [place], [place]  [address] Phone: [phone] Fax:  [phone]  Date:  03/08/2017   ID:  Mayo, Owczarzak 21-Sep-1924, MRN [id number]  PCP:  Lavone Orn, MD   History of Present Illness: Patricia Holt is a 83 y.o. female persistent atrial fibrillation, pacemaker dual chamber because of prior tachycardia bradycardia syndrome in April of 2010, chronic anticoagulation with bout of cardiomyopathy following cardioversion that spontaneously resolved here for followup.  On 11/30/11-her lisinopril was stopped because of chronic cough. Occasional cough still persisted. Aortic valve showed moderate aortic insufficiency. 8/11 echocardiogram. Furosemide 40 mg to be taken on as-needed basis if weight increased.  Loosing balance so stopped golf. Walking daily. No changes with breathing.   Sometimes when she wakes up in the morning she may have some mild wheezing.  She enjoyed going to [employer], [employer], [place] of her grandson.   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 This resulted in vertigo. At one point they thought that she had a brain tumor but they decided not to operate. She had an abscess in her posterior right cranium. She states that these were the days before penicillin.  Overall she's having no chest pain, no shortness of breath. She has been battling with severe allergies. No spray sometimes give her nosebleeds. She sits up sometimes at night because of the mucus production.   Wt Readings from Last 3 Encounters:  03/08/17 152 lb 6.4 oz (69.1 kg)  01/25/17 156 lb 12.8 oz (71.1 kg)  09/07/16 153 lb (69.4 kg)     Past Medical History:  Diagnosis Date  . Aortic valve disorder   . Atrial fibrillation (HCC)    paroxysmal, status post successful vessel cardioversion 5/09, Patricia Holt,transient left ventricular systolic dysfunction, likely secondary to cardioversion/stunning of the  myocardium, no evidence significant CAD, echo 7/09 shows normal ejection fraction  . Carpal tunnel syndrome   . Chronic anticoagulation   . DJD (degenerative joint disease)   . Dysrhythmia   . GERD (gastroesophageal reflux disease)    OTC occasionally  . Goiter   . HTN (hypertension)   . Hyperlipidemia   . Hypothyroidism   . Macular degeneration   . Mitral regurgitation   . Osteoporosis    , severe, hypercalciuria, Boniva since 2005, T10 compression fracture 2009  . Other disorder of calcium metabolism   . Polymyalgia rheumatica ([place])   . Presence of permanent cardiac pacemaker   . Sinoatrial node dysfunction (HCC)   . Sinus drainage    in the morning, clear drainage    Past Surgical History:  Procedure Laterality Date  . CARPAL TUNNEL RELEASE    . DILATION AND CURETTAGE OF UTERUS    . EYE SURGERY Bilateral    cataracts with lens   . FEMUR FRACTURE SURGERY    . PACEMAKER INSERTION     Dual chamber permanent pacer insertion for tachy brady syndrome, 01/2009  . TONSILLECTOMY AND ADENOIDECTOMY    . TOTAL HIP ARTHROPLASTY Right 12/16/2014   Procedure: RIGHT TOTAL HIP ARTHROPLASTY ANTERIOR APPROACH;  Surgeon: Hessie Dibble, MD;  Location: [facility];  Service: Orthopedics;  Laterality: Right;  . TOTAL KNEE ARTHROPLASTY      Current Outpatient Prescriptions  Medication Sig Dispense Refill  . acetaminophen (TYLENOL) 500 MG tablet Take 500 mg by mouth every 8 (eight) hours as needed (pain).    . CALCIUM  PO Take 500 mg by mouth 2 (two) times daily.    . Cholecalciferol (VITAMIN D PO) Take 1 tablet by mouth daily.    Marland Kitchen Dextromethorphan-Guaifenesin 10-200 MG CAPS Take 1 tablet by mouth 2 (two) times daily as needed (cold symptoms).    Marland Kitchen diltiazem (TIAZAC) 240 MG 24 hr capsule Take 240 mg by mouth daily.    Marland Kitchen docusate sodium (COLACE) 50 MG capsule Take 50 mg by mouth daily as needed (constipation).     . fluticasone (FLONASE) 50 MCG/ACT nasal spray Place 2 sprays into the nose daily as  needed for allergies (nasal drip).     . furosemide (LASIX) 40 MG tablet Take 20 mg by mouth as needed for fluid.     Marland Kitchen HYDROcodone-acetaminophen (NORCO/VICODIN) 5-325 MG per tablet Take 1-2 tablets by mouth every 4 (four) hours as needed (breakthrough pain). 50 tablet 0  . levothyroxine (SYNTHROID, LEVOTHROID) 25 MCG tablet TAKE 1 TABLET BY MOUTH DAILY 30 tablet 3  . Menthol, Topical Analgesic, (BENGAY EX) Apply 1 application topically 2 (two) times daily as needed (leg pain).    . metoprolol succinate (TOPROL-XL) 100 MG 24 hr tablet TAKE 1 TABLET BY MOUTH EVERY DAY WITH OR IMMEDIATELY FOLLOWING A MEAL 90 tablet 2  . Multiple Vitamins-Minerals (ICAPS PO) Take 1 capsule by mouth 2 (two) times daily.     Marland Kitchen OVER THE COUNTER MEDICATION Take by mouth daily as needed (nasal drip). Tylenol Cold - Mucous Severe (dextromethorphan, acetaminophen, phenylephrine, guaifenesin) - takes early morning if she wakes up with a nasal drip (1 swig)    . warfarin (COUMADIN) 4 MG tablet USE AS DIRECTED BY COUMADIN CLINIC 40 tablet 3   No current facility-administered medications for this visit.     Allergies:   No Known Allergies  Social History:  The patient  reports that she has never smoked. She has never used smokeless tobacco. She reports that she drinks alcohol. She reports that she does not use drugs.   ROS:  Unless specified above all other review of systems negative  PHYSICAL EXAM: VS:  BP 130/80   Pulse 88   Ht 5\' 7"  (1.702 m)   Wt 152 lb 6.4 oz (69.1 kg)   LMP  (LMP Unknown)   BMI 23.87 kg/m   GEN: Well nourished, well developed, in no acute distress  HEENT: normal  Neck: no JVD, carotid bruits, or masses Cardiac: irreg irreg; 1/6 murmur RUSB,no rubs, or gallops, 1-2+ bilateral lower extremity edema  Respiratory:  clear to auscultation bilaterally, normal work of breathing GI: soft, nontender, nondistended, + BS MS: no deformity or atrophy  Skin: warm and dry, no rash Neuro:  Alert and  Oriented x 3, Strength and sensation are intact Psych: euthymic mood, full affect  EKG: Previous 09/07/16-heart rate 79 bpm atrial fibrillation underlying, paced rhythm. 09/09/15-atrial fibrillation heart rate 95 bpm, isolated ventricular paced beat. 09/17/14- Atrial fibrillation 94, left axis deviation,no ST segment changes, no significant change from prior  ASSESSMENT AND PLAN:  1. Pacemaker - Dr. Lovena Le, 2011. Stable. Device working well. Dr. Tanna Furry note reviewed. 2. Atrial fibrillation-persistent, chronic. Doing well. Well rate controlled on current medications. No changes made. CHADS-VASc - 4 (hypertension, age, female) no symptoms. Contemplated decreasing her diltiazem in the past to maybe help improve her lower extremity edema however she has had ventricular high rates on pacemaker. We will continue. 3. Chronic anticoagulation-no bleeding. Continue to monitor closely. Monitored here. Not interested in novel oral anticoagulation 4. Hypertension- Doing  well. Prior ACE-I cough. However her cough persisted. She does have rhinorrhea. Potentially allergies. Dr. Laurann Montana has her on Flonase.  5. Aortic regurgitation-mild to moderate. No change clinically. No need for repeat echocardiogram at this time. Stable. 6. Pedal edema-she is taking furosemide as needed especially if weight increases 2 pounds, Dr. Laurann Montana. This is taken. Support hose recommended, she does have them. Elevate legs. No significant changes. Gravity playing a role. She understands. 7. Six-month follow-up  Signed, Candee Furbish, MD [facility]  03/08/2017 11:35 AM

## 2017-03-08 NOTE — Patient Instructions (Signed)

## 2017-04-04 ENCOUNTER — Encounter (HOSPITAL_COMMUNITY): Payer: Self-pay | Admitting: *Deleted

## 2017-04-04 ENCOUNTER — Emergency Department (HOSPITAL_COMMUNITY)
Admission: EM | Admit: 2017-04-04 | Discharge: 2017-04-04 | Disposition: A | Discharge status: Home / Self Care | Payer: Medicare Other | Attending: Emergency Medicine | Admitting: Emergency Medicine

## 2017-04-04 ENCOUNTER — Emergency Department (HOSPITAL_COMMUNITY): Payer: Medicare Other

## 2017-04-04 DIAGNOSIS — I10 Essential (primary) hypertension: Secondary | ICD-10-CM | POA: Insufficient documentation

## 2017-04-04 DIAGNOSIS — R0789 Other chest pain: Secondary | ICD-10-CM | POA: Diagnosis not present

## 2017-04-04 DIAGNOSIS — E039 Hypothyroidism, unspecified: Secondary | ICD-10-CM | POA: Insufficient documentation

## 2017-04-04 DIAGNOSIS — Z950 Presence of cardiac pacemaker: Secondary | ICD-10-CM | POA: Diagnosis not present

## 2017-04-04 DIAGNOSIS — R0682 Tachypnea, not elsewhere classified: Secondary | ICD-10-CM | POA: Diagnosis not present

## 2017-04-04 DIAGNOSIS — Z79899 Other long term (current) drug therapy: Secondary | ICD-10-CM | POA: Insufficient documentation

## 2017-04-04 DIAGNOSIS — Z96641 Presence of right artificial hip joint: Secondary | ICD-10-CM | POA: Diagnosis not present

## 2017-04-04 DIAGNOSIS — R0781 Pleurodynia: Secondary | ICD-10-CM | POA: Diagnosis not present

## 2017-04-04 MED ORDER — ACETAMINOPHEN 500 MG PO TABS
1000.0000 mg | ORAL_TABLET | Freq: Once | ORAL | Status: AC
  Administered 2017-04-04: 1000 mg via ORAL
  Filled 2017-04-04: qty 2

## 2017-04-04 NOTE — ED Provider Notes (Signed)
Complains of right-sided anterior chest pain onset 3 AM yesterday while riding in a car and she sneezed forcibly. Pain is worse with changing position or with deep inspiration improved with remaining still. She treated himself with Tylenol 10 PM last night with improvement of pain. On exam no distress lungs clear auscultation chest is tender anteriorly right side no crepitance or flail abdomen soft nontender   Orlie Dakin, MD 04/04/17 [phone]

## 2017-04-04 NOTE — ED Provider Notes (Signed)
[facility] DEPT Provider Note   CSN: [id number] Arrival date & time: 04/04/17  0911     History   Chief Complaint Chief Complaint  Patient presents with  . Pain    HPI Patricia Holt is a 83 y.o. female.  HPI Patient presents to the emergency department with right-sided rib pain that started last night.  Patient states that she was on a trip, and she sneezed and coughed and felt pain in the right side of her ribs.  She states that movement makes it worse.  She states that she took Tylenol with relief of her symptoms.  Patient states that she did take a shower and did not make it feel some better, but the pain got worse this morning when she moves it.  She called EMS. The patient denies chest pain, shortness of breath, headache,blurred vision, neck pain, fever, cough, weakness, numbness, dizziness, anorexia, edema, abdominal pain, nausea, vomiting, diarrhea, rash, back pain, dysuria, hematemesis, bloody stool, near syncope, or syncope. Past Medical History:  Diagnosis Date  . Aortic valve disorder   . Atrial fibrillation (HCC)    paroxysmal, status post successful vessel cardioversion 5/09, skains,transient left ventricular systolic dysfunction, likely secondary to cardioversion/stunning of the myocardium, no evidence significant CAD, echo 7/09 shows normal ejection fraction  . Carpal tunnel syndrome   . Chronic anticoagulation   . DJD (degenerative joint disease)   . Dysrhythmia   . GERD (gastroesophageal reflux disease)    OTC occasionally  . Goiter   . HTN (hypertension)   . Hyperlipidemia   . Hypothyroidism   . Macular degeneration   . Mitral regurgitation   . Osteoporosis    , severe, hypercalciuria, Boniva since 2005, T10 compression fracture 2009  . Other disorder of calcium metabolism   . Polymyalgia rheumatica ([place])   . Presence of permanent cardiac pacemaker   . Sinoatrial node dysfunction (HCC)   . Sinus drainage    in the morning, clear drainage     Patient Active Problem List   Diagnosis Date Noted  . Primary osteoarthritis of right hip 12/16/2014  . Encounter for therapeutic drug monitoring 12/04/2013  . Pacemaker 12/04/2013  . Long term current use of anticoagulant therapy 08/21/2013  . Atrial fibrillation ([place])   . HTN (hypertension)   . Aortic valve disorder   . Chronic anticoagulation     Past Surgical History:  Procedure Laterality Date  . CARPAL TUNNEL RELEASE    . DILATION AND CURETTAGE OF UTERUS    . EYE SURGERY Bilateral    cataracts with lens   . FEMUR FRACTURE SURGERY    . PACEMAKER INSERTION     Dual chamber permanent pacer insertion for tachy brady syndrome, 01/2009  . TONSILLECTOMY AND ADENOIDECTOMY    . TOTAL HIP ARTHROPLASTY Right 12/16/2014   Procedure: RIGHT TOTAL HIP ARTHROPLASTY ANTERIOR APPROACH;  Surgeon: Hessie Dibble, MD;  Location: [facility];  Service: Orthopedics;  Laterality: Right;  . TOTAL KNEE ARTHROPLASTY      OB History    No data available       Home Medications    Prior to Admission medications   Medication Sig Start Date End Date Taking? Authorizing Provider  acetaminophen (TYLENOL) 650 MG CR tablet Take 6,501,300 mg by mouth every 8 (eight) hours as needed for pain.   Yes [provider]  CALCIUM PO Take 500 mg by mouth 2 (two) times daily.   Yes [provider]  Cholecalciferol (VITAMIN D PO) Take 1  tablet by mouth daily.   Yes [provider]  DILT-XR 180 MG 24 hr capsule Take 180 mg by mouth daily. 03/02/17  Yes [provider]  docusate sodium (COLACE) 100 MG capsule Take 100 mg by mouth 2 (two) times daily.    Yes [provider]  furosemide (LASIX) 40 MG tablet Take 20 mg by mouth as needed for fluid.    Yes [provider]  levothyroxine (SYNTHROID, LEVOTHROID) 25 MCG tablet TAKE 1 TABLET BY MOUTH DAILY 02/19/15  Yes Lauree Chandler, NP  metoprolol succinate (TOPROL-XL) 100 MG 24 hr tablet TAKE 1 TABLET BY MOUTH EVERY  DAY WITH OR IMMEDIATELY FOLLOWING A MEAL 12/26/16  Yes Jerline Pain, MD  Multiple Vitamins-Minerals (ICAPS PO) Take 1 capsule by mouth daily.    Yes [provider]  OVER THE COUNTER MEDICATION Take by mouth daily as needed (nasal drip). Tylenol Cold - Mucous Severe (dextromethorphan, acetaminophen, phenylephrine, guaifenesin) - takes early morning if she wakes up with a nasal drip (1 swig)   Yes [provider]  warfarin (COUMADIN) 4 MG tablet USE AS DIRECTED BY COUMADIN CLINIC 12/08/16  Yes Jerline Pain, MD  HYDROcodone-acetaminophen (NORCO/VICODIN) 5-325 MG per tablet Take 1-2 tablets by mouth every 4 (four) hours as needed (breakthrough pain). Patient not taking: Reported on 04/04/2017 12/19/14   Loni Dolly, PA-C  Menthol, Topical Analgesic, (BENGAY EX) Apply 1 application topically 2 (two) times daily as needed (leg pain).    [provider]    Family History Family History  Problem Relation Age of Onset  . Hypertension Mother   . Hypertension Father   . Cancer Sister   . Diabetes Sister     Social History Social History  Substance Use Topics  . Smoking status: Never Smoker  . Smokeless tobacco: Never Used  . Alcohol use Yes     Comment: wine and beer occasionally     Allergies   Patient has no known allergies.   Review of Systems Review of Systems All other systems negative except as documented in the HPI. All pertinent positives and negatives as reviewed in the HPI.  Physical Exam Updated Vital Signs BP (!) 120/54   Pulse 85   Temp 98 F (36.7 C) (Oral)   Resp 14   LMP  (LMP Unknown)   SpO2 94%   Physical Exam  Constitutional: She is oriented to person, place, and time. She appears well-developed and well-nourished. No distress.  HENT:  Head: Normocephalic and atraumatic.  Mouth/Throat: Oropharynx is clear and moist.  Eyes: Pupils are equal, round, and reactive to light.  Neck: Normal range of motion. Neck supple.  Cardiovascular:  Normal rate, regular rhythm and normal heart sounds.  Exam reveals no gallop and no friction rub.   No murmur heard. Pulmonary/Chest: Effort normal and breath sounds normal. No respiratory distress. She has no wheezes. She exhibits tenderness.  Abdominal: Soft. Bowel sounds are normal. She exhibits no distension. There is no tenderness.  Neurological: She is alert and oriented to person, place, and time. She exhibits normal muscle tone. Coordination normal.  Skin: Skin is warm and dry. Capillary refill takes less than 2 seconds. No rash noted. No erythema.  Psychiatric: She has a normal mood and affect. Her behavior is normal.  Nursing note and vitals reviewed.    ED Treatments / Results  Labs (all labs ordered are listed, but only abnormal results are displayed) Labs Reviewed - No data to display  EKG  EKG Interpretation None       Radiology Dg Ribs Unilateral W/chest Right  Result Date: 04/04/2017 CLINICAL DATA:  83 year old with three-day history of right lower rib pain. No recent injuries. Pain exacerbated by coughing. Nonsmoker. EXAM: RIGHT RIBS AND CHEST - 3+ VIEW COMPARISON:  No prior rib imaging. Chest x-rays 04/01/2015, 12/03/2014 and earlier, including CTA chest 07/13/2008. FINDINGS: No right rib fractures identified. Prominent costal cartilage calcification. Osseous demineralization. Cardiac silhouette mildly to moderately enlarged, unchanged. Thoracic aorta atherosclerotic, unchanged. Hilar and mediastinal contours otherwise unremarkable. Prominent bronchovascular markings diffusely and central peribronchial thickening, a chronic finding stable dating back to 2016. No new pulmonary parenchymal abnormalities. 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 IMPRESSION: 1. No right rib fractures identified. 2. Stable cardiomegaly. Chronic bronchitis and/or asthma, unchanged dating back to 2016. No acute cardiopulmonary  disease. Electronically Signed   By: Evangeline Dakin M.D.   On: 04/04/2017 12:03    Procedures Procedures (including critical care time)  Medications Ordered in ED Medications  acetaminophen (TYLENOL) tablet 1,000 mg (1,000 mg Oral Given 04/04/17 1001)     Initial Impression / Assessment and Plan / ED Course  I have reviewed the triage vital signs and the nursing notes.  Pertinent labs & imaging results that were available during my care of the patient were reviewed by me and considered in my medical decision making (see chart for details).     Patient has what appears to be chest wall pain.  She does not have any signs of respiratory distress or abnormality.  Patient was seen by Dr. Cathleen Fears who also felt the patient did not have any signs of what appeared to be pulmonary embolus at this point.  The patient has area of specific tenderness on exam is reproducible on palpation  Final Clinical Impressions(s) / ED Diagnoses   Final diagnoses:  Chest wall pain    New Prescriptions New Prescriptions   No medications on file     Dalia Heading, Hershal Coria 04/04/17 [place], MD 04/05/17 [phone]

## 2017-04-04 NOTE — ED Notes (Signed)
Patient to xray.

## 2017-04-04 NOTE — ED Triage Notes (Signed)
Pt arrived by [facility] for right side rib pain since last night. Had long car ride yesterday, denies any injury to her ribs. Pain increases with movement and breathing. Lung sounds clear bilateral pta.

## 2017-04-04 NOTE — Discharge Instructions (Signed)
Return here as needed.  Follow-up with your primary doctor. °

## 2017-04-04 NOTE — ED Notes (Signed)
EDP at bedside to explain results.

## 2017-04-10 ENCOUNTER — Ambulatory Visit (INDEPENDENT_AMBULATORY_CARE_PROVIDER_SITE_OTHER): Payer: Medicare Other

## 2017-04-10 DIAGNOSIS — Z7901 Long term (current) use of anticoagulants: Secondary | ICD-10-CM | POA: Diagnosis not present

## 2017-04-10 DIAGNOSIS — I4891 Unspecified atrial fibrillation: Secondary | ICD-10-CM

## 2017-04-10 DIAGNOSIS — Z5181 Encounter for therapeutic drug level monitoring: Secondary | ICD-10-CM

## 2017-04-10 LAB — POCT INR: INR: 3.9

## 2017-04-26 ENCOUNTER — Ambulatory Visit (INDEPENDENT_AMBULATORY_CARE_PROVIDER_SITE_OTHER): Payer: Medicare Other | Admitting: *Deleted

## 2017-04-26 DIAGNOSIS — I495 Sick sinus syndrome: Secondary | ICD-10-CM

## 2017-04-27 ENCOUNTER — Encounter: Payer: Self-pay | Admitting: Cardiology

## 2017-04-27 NOTE — Progress Notes (Signed)
Remote pacemaker transmission.   

## 2017-05-01 ENCOUNTER — Ambulatory Visit (INDEPENDENT_AMBULATORY_CARE_PROVIDER_SITE_OTHER): Payer: Medicare Other | Admitting: Pharmacist

## 2017-05-01 DIAGNOSIS — Z7901 Long term (current) use of anticoagulants: Secondary | ICD-10-CM

## 2017-05-01 DIAGNOSIS — I4891 Unspecified atrial fibrillation: Secondary | ICD-10-CM | POA: Diagnosis not present

## 2017-05-01 DIAGNOSIS — Z5181 Encounter for therapeutic drug level monitoring: Secondary | ICD-10-CM | POA: Diagnosis not present

## 2017-05-01 LAB — POCT INR: INR: 2.4

## 2017-05-02 ENCOUNTER — Other Ambulatory Visit: Payer: Self-pay | Admitting: Cardiology

## 2017-05-09 LAB — CUP PACEART REMOTE DEVICE CHECK
Battery Impedance: 2841 Ohm
Battery Remaining Longevity: 27 mo
Battery Voltage: 2.73 V
Brady Statistic RV Percent Paced: 35 %
Date Time Interrogation Session: 20180627142544
Implantable Lead Implant Date: 20100409
Implantable Lead Implant Date: 20100409
Implantable Lead Location: 753859
Implantable Lead Location: 753860
Implantable Lead Model: 5076
Implantable Lead Model: 5092
Implantable Pulse Generator Implant Date: 20100409
Lead Channel Impedance Value: 67 Ohm
Lead Channel Impedance Value: 687 Ohm
Lead Channel Pacing Threshold Amplitude: 0.875 V
Lead Channel Pacing Threshold Pulse Width: 0.4 ms
Lead Channel Setting Pacing Amplitude: 2 V
Lead Channel Setting Pacing Pulse Width: 0.4 ms
Lead Channel Setting Sensing Sensitivity: 2 mV

## 2017-05-10 DIAGNOSIS — L578 Other skin changes due to chronic exposure to nonionizing radiation: Secondary | ICD-10-CM | POA: Diagnosis not present

## 2017-05-10 DIAGNOSIS — L821 Other seborrheic keratosis: Secondary | ICD-10-CM | POA: Diagnosis not present

## 2017-05-10 DIAGNOSIS — I788 Other diseases of capillaries: Secondary | ICD-10-CM | POA: Diagnosis not present

## 2017-05-17 DIAGNOSIS — H353232 Exudative age-related macular degeneration, bilateral, with inactive choroidal neovascularization: Secondary | ICD-10-CM | POA: Diagnosis not present

## 2017-05-24 DIAGNOSIS — M810 Age-related osteoporosis without current pathological fracture: Secondary | ICD-10-CM | POA: Diagnosis not present

## 2017-05-24 DIAGNOSIS — I480 Paroxysmal atrial fibrillation: Secondary | ICD-10-CM | POA: Diagnosis not present

## 2017-05-24 DIAGNOSIS — I351 Nonrheumatic aortic (valve) insufficiency: Secondary | ICD-10-CM | POA: Diagnosis not present

## 2017-05-24 DIAGNOSIS — I10 Essential (primary) hypertension: Secondary | ICD-10-CM | POA: Diagnosis not present

## 2017-05-24 DIAGNOSIS — I872 Venous insufficiency (chronic) (peripheral): Secondary | ICD-10-CM | POA: Diagnosis not present

## 2017-05-25 DIAGNOSIS — H353232 Exudative age-related macular degeneration, bilateral, with inactive choroidal neovascularization: Secondary | ICD-10-CM | POA: Diagnosis not present

## 2017-05-25 DIAGNOSIS — Z961 Presence of intraocular lens: Secondary | ICD-10-CM | POA: Diagnosis not present

## 2017-05-31 ENCOUNTER — Ambulatory Visit (INDEPENDENT_AMBULATORY_CARE_PROVIDER_SITE_OTHER): Payer: Medicare Other | Admitting: *Deleted

## 2017-05-31 DIAGNOSIS — Z7901 Long term (current) use of anticoagulants: Secondary | ICD-10-CM

## 2017-05-31 DIAGNOSIS — I4891 Unspecified atrial fibrillation: Secondary | ICD-10-CM

## 2017-05-31 DIAGNOSIS — Z5181 Encounter for therapeutic drug level monitoring: Secondary | ICD-10-CM

## 2017-05-31 LAB — POCT INR: INR: 2.7

## 2017-06-08 ENCOUNTER — Telehealth: Payer: Self-pay | Admitting: Cardiology

## 2017-06-08 NOTE — Telephone Encounter (Signed)
New message     Pt c/o BP issue: STAT if pt c/o blurred vision, one-sided weakness or slurred speech  1. What are your last 5 BP readings? 84/42 yesterday  2. Are you having any other symptoms (ex. Dizziness, headache, blurred vision, passed out)?  Lightheaded and fatigued   3. What is your BP issue?  [employer] called this in they would like you to call her and do a well check

## 2017-06-08 NOTE — Telephone Encounter (Signed)
Called and spoke to patient. Patient states that yesterday morning she was not feeling well. She states that she was nauseous, dizzy, and lightheaded, and was not able to eat very much. She states that her BP was 84/42 with HR 79. She states that she is feeling better today and that her BP this morning was 133/60 with HR 93. She states that she had a good breakfast this morning and that she took her BP meds. Patient takes dilt 180 mg QD and toprol 100 mg QD. Patient denies having any symptoms today. Patient was advised to continue to monitor BP and let us know if her BP gets too low again or if she becomes symptomatic. Patient verbalized understanding and thanked me for the call.

## 2017-06-16 NOTE — Telephone Encounter (Signed)
Glad she is feeling better. Agree with plan. If BP drops again, will need to pull back on medications for rate control.  Candee Furbish, MD

## 2017-07-05 ENCOUNTER — Ambulatory Visit (INDEPENDENT_AMBULATORY_CARE_PROVIDER_SITE_OTHER): Payer: Medicare Other | Admitting: *Deleted

## 2017-07-05 DIAGNOSIS — I4891 Unspecified atrial fibrillation: Secondary | ICD-10-CM

## 2017-07-05 DIAGNOSIS — Z5181 Encounter for therapeutic drug level monitoring: Secondary | ICD-10-CM | POA: Diagnosis not present

## 2017-07-05 DIAGNOSIS — Z7901 Long term (current) use of anticoagulants: Secondary | ICD-10-CM

## 2017-07-05 LAB — POCT INR: INR: 2.2

## 2017-07-26 ENCOUNTER — Ambulatory Visit (INDEPENDENT_AMBULATORY_CARE_PROVIDER_SITE_OTHER): Payer: Medicare Other | Admitting: *Deleted

## 2017-07-26 DIAGNOSIS — I495 Sick sinus syndrome: Secondary | ICD-10-CM | POA: Diagnosis not present

## 2017-07-26 NOTE — Progress Notes (Signed)
Remote pacemaker transmission.   

## 2017-07-27 ENCOUNTER — Encounter: Payer: Self-pay | Admitting: Cardiology

## 2017-07-27 LAB — CUP PACEART REMOTE DEVICE CHECK
Battery Impedance: 3164 Ohm
Battery Remaining Longevity: 23 mo
Battery Voltage: 2.73 V
Brady Statistic RV Percent Paced: 35 %
Date Time Interrogation Session: 20180926124836
Implantable Lead Implant Date: 20100409
Implantable Lead Implant Date: 20100409
Implantable Lead Location: 753859
Implantable Lead Location: 753860
Implantable Lead Model: 5076
Implantable Lead Model: 5092
Implantable Pulse Generator Implant Date: 20100409
Lead Channel Impedance Value: 67 Ohm
Lead Channel Impedance Value: 739 Ohm
Lead Channel Pacing Threshold Amplitude: 0.875 V
Lead Channel Pacing Threshold Pulse Width: 0.4 ms
Lead Channel Setting Pacing Amplitude: 2 V
Lead Channel Setting Pacing Pulse Width: 0.4 ms
Lead Channel Setting Sensing Sensitivity: 2 mV

## 2017-07-28 NOTE — Telephone Encounter (Signed)
Thanks for update °Atlee Kluth, MD ° °

## 2017-08-16 ENCOUNTER — Ambulatory Visit (INDEPENDENT_AMBULATORY_CARE_PROVIDER_SITE_OTHER): Payer: Medicare Other

## 2017-08-16 DIAGNOSIS — Z5181 Encounter for therapeutic drug level monitoring: Secondary | ICD-10-CM

## 2017-08-16 DIAGNOSIS — Z7901 Long term (current) use of anticoagulants: Secondary | ICD-10-CM | POA: Diagnosis not present

## 2017-08-16 DIAGNOSIS — I4891 Unspecified atrial fibrillation: Secondary | ICD-10-CM

## 2017-08-16 LAB — POCT INR: INR: 2.5

## 2017-08-23 DIAGNOSIS — H353232 Exudative age-related macular degeneration, bilateral, with inactive choroidal neovascularization: Secondary | ICD-10-CM | POA: Diagnosis not present

## 2017-08-23 DIAGNOSIS — H43813 Vitreous degeneration, bilateral: Secondary | ICD-10-CM | POA: Diagnosis not present

## 2017-08-23 DIAGNOSIS — H35421 Microcystoid degeneration of retina, right eye: Secondary | ICD-10-CM | POA: Diagnosis not present

## 2017-08-27 ENCOUNTER — Other Ambulatory Visit: Payer: Self-pay | Admitting: Cardiology

## 2017-08-30 DIAGNOSIS — Z23 Encounter for immunization: Secondary | ICD-10-CM | POA: Diagnosis not present

## 2017-09-15 ENCOUNTER — Other Ambulatory Visit: Payer: Self-pay | Admitting: Cardiology

## 2017-09-15 MED ORDER — METOPROLOL SUCCINATE ER 100 MG PO TB24: ORAL_TABLET | ORAL | 1 refills | Status: DC

## 2017-09-15 NOTE — Addendum Note (Signed)
Addended by: Derl Barrow on: 09/15/2017 09:58 AM   Modules accepted: Orders

## 2017-09-24 ENCOUNTER — Emergency Department (HOSPITAL_COMMUNITY)
Admission: EM | Admit: 2017-09-24 | Discharge: 2017-09-24 | Disposition: A | Discharge status: Home / Self Care | Payer: Medicare Other | Attending: Emergency Medicine | Admitting: Emergency Medicine

## 2017-09-24 ENCOUNTER — Emergency Department (HOSPITAL_COMMUNITY): Payer: Medicare Other

## 2017-09-24 ENCOUNTER — Encounter (HOSPITAL_COMMUNITY): Payer: Self-pay | Admitting: Emergency Medicine

## 2017-09-24 DIAGNOSIS — M25552 Pain in left hip: Secondary | ICD-10-CM | POA: Diagnosis not present

## 2017-09-24 DIAGNOSIS — I10 Essential (primary) hypertension: Secondary | ICD-10-CM | POA: Insufficient documentation

## 2017-09-24 DIAGNOSIS — Z471 Aftercare following joint replacement surgery: Secondary | ICD-10-CM | POA: Diagnosis not present

## 2017-09-24 DIAGNOSIS — M79609 Pain in unspecified limb: Secondary | ICD-10-CM | POA: Diagnosis not present

## 2017-09-24 DIAGNOSIS — Z79899 Other long term (current) drug therapy: Secondary | ICD-10-CM | POA: Insufficient documentation

## 2017-09-24 DIAGNOSIS — Z7901 Long term (current) use of anticoagulants: Secondary | ICD-10-CM | POA: Diagnosis not present

## 2017-09-24 DIAGNOSIS — Z96641 Presence of right artificial hip joint: Secondary | ICD-10-CM | POA: Diagnosis not present

## 2017-09-24 DIAGNOSIS — S8990XA Unspecified injury of unspecified lower leg, initial encounter: Secondary | ICD-10-CM | POA: Diagnosis not present

## 2017-09-24 DIAGNOSIS — E039 Hypothyroidism, unspecified: Secondary | ICD-10-CM | POA: Diagnosis not present

## 2017-09-24 DIAGNOSIS — S79911A Unspecified injury of right hip, initial encounter: Secondary | ICD-10-CM | POA: Diagnosis not present

## 2017-09-24 DIAGNOSIS — R52 Pain, unspecified: Secondary | ICD-10-CM

## 2017-09-24 DIAGNOSIS — M25551 Pain in right hip: Secondary | ICD-10-CM | POA: Diagnosis not present

## 2017-09-24 MED ORDER — CYCLOBENZAPRINE HCL 5 MG PO TABS: 5.0000 mg | ORAL_TABLET | Freq: Three times a day (TID) | ORAL | 0 refills | Status: AC | PRN

## 2017-09-24 MED ORDER — CYCLOBENZAPRINE HCL 10 MG PO TABS
5.0000 mg | ORAL_TABLET | Freq: Once | ORAL | Status: AC
  Administered 2017-09-24: 5 mg via ORAL
  Filled 2017-09-24: qty 1

## 2017-09-24 MED ORDER — HYDROCODONE-ACETAMINOPHEN 5-325 MG PO TABS
1.0000 | ORAL_TABLET | Freq: Once | ORAL | Status: AC
  Administered 2017-09-24: 1 via ORAL
  Filled 2017-09-24: qty 1

## 2017-09-24 NOTE — ED Provider Notes (Signed)
[facility] EMERGENCY DEPARTMENT Provider Note   CSN: [id number] Arrival date & time: 09/24/17  1718     History   Chief Complaint Chief Complaint  Patient presents with  . Hip Pain    HPI Patricia Holt is a 83 y.o. female.  Patient with history of atrial fibrillation, high blood pressure, osteoporosis presents with acute left hip and thigh tenderness. Patient was sitting down playing cards today and had worsening intermittent pain in the lateral muscle area. No direct injury. Patient had femur fracture with rod in the past. No weakness. No abdominal pain or back pain. No history of back pathology. Symptoms intermittent and worse with position and movement.      Past Medical History:  Diagnosis Date  . Aortic valve disorder   . Atrial fibrillation (HCC)    paroxysmal, status post successful vessel cardioversion 5/09, skains,transient left ventricular systolic dysfunction, likely secondary to cardioversion/stunning of the myocardium, no evidence significant CAD, echo 7/09 shows normal ejection fraction  . Carpal tunnel syndrome   . Chronic anticoagulation   . DJD (degenerative joint disease)   . Dysrhythmia   . GERD (gastroesophageal reflux disease)    OTC occasionally  . Goiter   . HTN (hypertension)   . Hyperlipidemia   . Hypothyroidism   . Macular degeneration   . Mitral regurgitation   . Osteoporosis    , severe, hypercalciuria, Boniva since 2005, T10 compression fracture 2009  . Other disorder of calcium metabolism   . Polymyalgia rheumatica ([place])   . Presence of permanent cardiac pacemaker   . Sinoatrial node dysfunction (HCC)   . Sinus drainage    in the morning, clear drainage    Patient Active Problem List   Diagnosis Date Noted  . Primary osteoarthritis of right hip 12/16/2014  . Encounter for therapeutic drug monitoring 12/04/2013  . Pacemaker 12/04/2013  . Long term current use of anticoagulant therapy 08/21/2013  . Atrial  fibrillation ([place])   . HTN (hypertension)   . Aortic valve disorder   . Chronic anticoagulation     Past Surgical History:  Procedure Laterality Date  . CARPAL TUNNEL RELEASE    . DILATION AND CURETTAGE OF UTERUS    . EYE SURGERY Bilateral    cataracts with lens   . FEMUR FRACTURE SURGERY    . PACEMAKER INSERTION     Dual chamber permanent pacer insertion for tachy brady syndrome, 01/2009  . TONSILLECTOMY AND ADENOIDECTOMY    . TOTAL HIP ARTHROPLASTY Right 12/16/2014   Procedure: RIGHT TOTAL HIP ARTHROPLASTY ANTERIOR APPROACH;  Surgeon: Hessie Dibble, MD;  Location: [facility];  Service: Orthopedics;  Laterality: Right;  . TOTAL KNEE ARTHROPLASTY      OB History    No data available       Home Medications    Prior to Admission medications   Medication Sig Start Date End Date Taking? Authorizing Provider  acetaminophen (TYLENOL) 650 MG CR tablet Take 6,501,300 mg by mouth every 8 (eight) hours as needed for pain.    [provider]  CALCIUM PO Take 500 mg by mouth 2 (two) times daily.    [provider]  Cholecalciferol (VITAMIN D PO) Take 1 tablet by mouth daily.    [provider]  cyclobenzaprine (FLEXERIL) 5 MG tablet Take 1 tablet (5 mg total) by mouth 3 (three) times daily as needed for muscle spasms. 09/24/17   Elnora Morrison, MD  DILT-XR 180 MG 24 hr capsule Take 180  mg by mouth daily. 03/02/17   [provider]  docusate sodium (COLACE) 100 MG capsule Take 100 mg by mouth 2 (two) times daily.     [provider]  furosemide (LASIX) 40 MG tablet Take 20 mg by mouth as needed for fluid.     [provider]  HYDROcodone-acetaminophen (NORCO/VICODIN) 5-325 MG per tablet Take 1-2 tablets by mouth every 4 (four) hours as needed (breakthrough pain). Patient not taking: Reported on 04/04/2017 12/19/14   Loni Dolly, PA-C  levothyroxine (SYNTHROID, LEVOTHROID) 25 MCG tablet TAKE 1 TABLET BY MOUTH DAILY 02/19/15   Lauree Chandler,  NP  Menthol, Topical Analgesic, (BENGAY EX) Apply 1 application topically 2 (two) times daily as needed (leg pain).    [provider]  metoprolol succinate (TOPROL-XL) 100 MG 24 hr tablet TAKE 1 TABLET BY MOUTH EVERY DAY WITH OR IMMEDIATELY FOLLOWING A MEAL 09/15/17   Jerline Pain, MD  Multiple Vitamins-Minerals (ICAPS PO) Take 1 capsule by mouth daily.     [provider]  OVER THE COUNTER MEDICATION Take by mouth daily as needed (nasal drip). Tylenol Cold - Mucous Severe (dextromethorphan, acetaminophen, phenylephrine, guaifenesin) - takes early morning if she wakes up with a nasal drip (1 swig)    [provider]  warfarin (COUMADIN) 4 MG tablet USE AS DIRECTED BY COUMADIN CLINIC 08/28/17   Jerline Pain, MD    Family History Family History  Problem Relation Age of Onset  . Hypertension Mother   . Hypertension Father   . Cancer Sister   . Diabetes Sister     Social History Social History   Tobacco Use  . Smoking status: Never Smoker  . Smokeless tobacco: Never Used  Substance Use Topics  . Alcohol use: Yes    Comment: wine and beer occasionally  . Drug use: No     Allergies   Patient has no known allergies.   Review of Systems Review of Systems  Constitutional: Negative for chills and fever.  HENT: Negative for congestion.   Eyes: Negative for visual disturbance.  Respiratory: Negative for shortness of breath.   Cardiovascular: Negative for chest pain.  Gastrointestinal: Negative for abdominal pain and vomiting.  Genitourinary: Negative for dysuria and flank pain.  Musculoskeletal: Positive for gait problem. Negative for back pain, neck pain and neck stiffness.  Skin: Negative for rash.  Neurological: Negative for light-headedness and headaches.     Physical Exam Updated Vital Signs BP 140/63   Pulse 87   LMP  (LMP Unknown)   SpO2 94%   Physical Exam  Constitutional: She is oriented to person, place, and time. She appears  well-developed and well-nourished.  HENT:  Head: Normocephalic and atraumatic.  Eyes: Conjunctivae are normal. Right eye exhibits no discharge. Left eye exhibits no discharge.  Neck: Neck supple. No tracheal deviation present.  Cardiovascular: Normal rate.  Pulmonary/Chest: Effort normal.  Abdominal: Soft. She exhibits no distension. There is no tenderness. There is no guarding.  Musculoskeletal: She exhibits tenderness. She exhibits no edema.  Patient has no focal tenderness to palpation of left lower extremity or left hip. Patient has intermittent pain with specific positions sometimes flexion or external rotation of the left hip. Mild edema left lower extremity with is chronic. Neurovascularly intact. 5+ strength with flexion extension major joints.  Neurological: She is alert and oriented to person, place, and time.  Skin: Skin is warm. No rash noted.  Psychiatric: She has a normal mood and affect.  Nursing  note and vitals reviewed.    ED Treatments / Results  Labs (all labs ordered are listed, but only abnormal results are displayed) Labs Reviewed - No data to display  EKG  EKG Interpretation None       Radiology Dg Pelvis 1-2 Views  Result Date: 09/24/2017 CLINICAL DATA:  Left hip pain EXAM: PELVIS - 1-2 VIEW COMPARISON:  None. FINDINGS: Probable calcified fibroid in the pelvis. Status post right hip replacement with normal alignment. Intramedullary rod in the left femur. No fracture or malalignment. Minimal degenerative change of the left. Pubic symphysis appears intact. IMPRESSION: 1. Status post right hip replacement and intramedullary rodding of the proximal left femur. No fracture or malalignment is seen. 2. Minimal degenerative changes of the left hip. Electronically Signed   By: Donavan Foil M.D.   On: 09/24/2017 18:47   Dg Femur Min 2 Views Left  Result Date: 09/24/2017 CLINICAL DATA:  Left hip pain EXAM: LEFT FEMUR 2 VIEWS COMPARISON:  None. FINDINGS: Status post  intramedullary rod and screw fixation of the left femur for old proximal to mid femoral fracture deformity. No acute displaced fracture or malalignment is seen. Pubic symphysis appears intact. Probable calcified fibroids in the pelvis. IMPRESSION: No acute osseous abnormality. Prior intramedullary rodding of the left femur. Electronically Signed   By: Donavan Foil M.D.   On: 09/24/2017 18:54    Procedures Procedures (including critical care time)  Medications Ordered in ED Medications  HYDROcodone-acetaminophen (NORCO/VICODIN) 5-325 MG per tablet 1 tablet (1 tablet Oral Given 09/24/17 1823)  cyclobenzaprine (FLEXERIL) tablet 5 mg (5 mg Oral Given 09/24/17 1823)     Initial Impression / Assessment and Plan / ED Course  I have reviewed the triage vital signs and the nursing notes.  Pertinent labs & imaging results that were available during my care of the patient were reviewed by me and considered in my medical decision making (see chart for details).     Patient presents with intermittent left lateral hip tenderness. Discussed likely musculoskeletal/muscle spasm. Plan for x-rays, pain meds and muscle relaxant.  Patient improved and reassessment. Patient ambulated with walker. Discussed supportive care x-rays negative.   Final Clinical Impressions(s) / ED Diagnoses   Final diagnoses:  Acute hip pain, left  Acute pain of right hip    ED Discharge Orders        Ordered    cyclobenzaprine (FLEXERIL) 5 MG tablet  3 times daily PRN     09/24/17 1942       Elnora Morrison, MD 09/24/17 1944

## 2017-09-24 NOTE — ED Notes (Signed)
Pt ambulated with assistance. Pt had steady gait with a walker.

## 2017-09-24 NOTE — ED Notes (Signed)
Patient able to ambulate independently at baseline with walker

## 2017-09-24 NOTE — ED Triage Notes (Signed)
Pt arrives via EMS from home alone with complaints of left hip and leg pain. Pt denies any trauma or falling. Reports sitting at home and pain suddenly began and not able to ambulate. Pt reports increased swelling to the left leg.

## 2017-09-24 NOTE — Discharge Instructions (Addendum)
For mild or moderate pain use Tylenol and ice.  For severe muscle spasms that do not respond to heat you can try Flexeril however be cautious as it can affect her balance/make you sleepy  If you were given medicines take as directed.  If you are on coumadin or contraceptives realize their levels and effectiveness is altered by many different medicines.  If you have any reaction (rash, tongues swelling, other) to the medicines stop taking and see a physician.    If your blood pressure was elevated in the ER make sure you follow up for management with a primary doctor or return for chest pain, shortness of breath or stroke symptoms.  Please follow up as directed and return to the ER or see a physician for new or worsening symptoms.  Thank you. Vitals:   09/24/17 1720  SpO2: 96%

## 2017-09-27 ENCOUNTER — Ambulatory Visit (INDEPENDENT_AMBULATORY_CARE_PROVIDER_SITE_OTHER): Payer: Medicare Other | Admitting: Pharmacist

## 2017-09-27 DIAGNOSIS — I4891 Unspecified atrial fibrillation: Secondary | ICD-10-CM | POA: Diagnosis not present

## 2017-09-27 DIAGNOSIS — Z5181 Encounter for therapeutic drug level monitoring: Secondary | ICD-10-CM

## 2017-09-27 LAB — POCT INR: INR: 1.8

## 2017-09-27 NOTE — Patient Instructions (Signed)
Take 1.5 tablets today, then continue taking same dosage 1 tablet every day except 1.5 tablets on Mondays and Fridays. Recheck INR in 3 weeks. Call with any questions or concerns [phone]

## 2017-10-11 ENCOUNTER — Ambulatory Visit: Payer: Medicare Other | Admitting: Cardiology

## 2017-10-16 ENCOUNTER — Encounter (HOSPITAL_COMMUNITY): Payer: Self-pay | Admitting: Emergency Medicine

## 2017-10-16 ENCOUNTER — Emergency Department (HOSPITAL_COMMUNITY)
Admission: EM | Admit: 2017-10-16 | Discharge: 2017-10-16 | Disposition: A | Discharge status: Home / Self Care | Payer: Medicare Other | Attending: Emergency Medicine | Admitting: Emergency Medicine

## 2017-10-16 DIAGNOSIS — I10 Essential (primary) hypertension: Secondary | ICD-10-CM | POA: Diagnosis not present

## 2017-10-16 DIAGNOSIS — Z950 Presence of cardiac pacemaker: Secondary | ICD-10-CM | POA: Diagnosis not present

## 2017-10-16 DIAGNOSIS — Z79899 Other long term (current) drug therapy: Secondary | ICD-10-CM | POA: Insufficient documentation

## 2017-10-16 DIAGNOSIS — I480 Paroxysmal atrial fibrillation: Secondary | ICD-10-CM | POA: Insufficient documentation

## 2017-10-16 DIAGNOSIS — E039 Hypothyroidism, unspecified: Secondary | ICD-10-CM | POA: Insufficient documentation

## 2017-10-16 DIAGNOSIS — Z96659 Presence of unspecified artificial knee joint: Secondary | ICD-10-CM | POA: Insufficient documentation

## 2017-10-16 DIAGNOSIS — R531 Weakness: Secondary | ICD-10-CM | POA: Diagnosis present

## 2017-10-16 DIAGNOSIS — Z96641 Presence of right artificial hip joint: Secondary | ICD-10-CM | POA: Insufficient documentation

## 2017-10-16 DIAGNOSIS — R0902 Hypoxemia: Secondary | ICD-10-CM | POA: Diagnosis not present

## 2017-10-16 LAB — I-STAT CHEM 8, ED
BUN: 18 mg/dL (ref 6–20)
Calcium, Ion: 1.15 mmol/L (ref 1.15–1.40)
Chloride: 98 mmol/L — ABNORMAL LOW (ref 101–111)
Creatinine, Ser: 0.8 mg/dL (ref 0.44–1.00)
Glucose, Bld: 89 mg/dL (ref 65–99)
HCT: 42 % (ref 36.0–46.0)
Hemoglobin: 14.3 g/dL (ref 12.0–15.0)
Potassium: 3.9 mmol/L (ref 3.5–5.1)
Sodium: 136 mmol/L (ref 135–145)
TCO2: 31 mmol/L (ref 22–32)

## 2017-10-16 NOTE — ED Notes (Signed)
Pt able to ambulate in hall with walker ,

## 2017-10-16 NOTE — ED Provider Notes (Signed)
[facility] EMERGENCY DEPARTMENT Provider Note   CSN: [id number] Arrival date & time: 10/16/17  1549     History   Chief Complaint Chief Complaint  Patient presents with  . Weakness    HPI Patricia Holt is a 83 y.o. female.  The history is provided by the patient, a relative and medical records.  Illness  This is a recurrent problem. The current episode started less than 1 hour ago. The problem occurs rarely. The problem has been resolved. Pertinent negatives include no chest pain, no abdominal pain, no headaches and no shortness of breath. Associated symptoms comments: Palpitations, lightheadedness, nausea. Nothing aggravates the symptoms. Relieved by: symptoms abated on own.    Past Medical History:  Diagnosis Date  . Aortic valve disorder   . Atrial fibrillation (HCC)    paroxysmal, status post successful vessel cardioversion 5/09, skains,transient left ventricular systolic dysfunction, likely secondary to cardioversion/stunning of the myocardium, no evidence significant CAD, echo 7/09 shows normal ejection fraction  . Carpal tunnel syndrome   . Chronic anticoagulation   . DJD (degenerative joint disease)   . Dysrhythmia   . GERD (gastroesophageal reflux disease)    OTC occasionally  . Goiter   . HTN (hypertension)   . Hyperlipidemia   . Hypothyroidism   . Macular degeneration   . Mitral regurgitation   . Osteoporosis    , severe, hypercalciuria, Boniva since 2005, T10 compression fracture 2009  . Other disorder of calcium metabolism   . Polymyalgia rheumatica ([place])   . Presence of permanent cardiac pacemaker   . Sinoatrial node dysfunction (HCC)   . Sinus drainage    in the morning, clear drainage    Patient Active Problem List   Diagnosis Date Noted  . Primary osteoarthritis of right hip 12/16/2014  . Encounter for therapeutic drug monitoring 12/04/2013  . Pacemaker 12/04/2013  . Long term current use of anticoagulant therapy  08/21/2013  . Atrial fibrillation ([place])   . HTN (hypertension)   . Aortic valve disorder   . Chronic anticoagulation     Past Surgical History:  Procedure Laterality Date  . CARPAL TUNNEL RELEASE    . DILATION AND CURETTAGE OF UTERUS    . EYE SURGERY Bilateral    cataracts with lens   . FEMUR FRACTURE SURGERY    . PACEMAKER INSERTION     Dual chamber permanent pacer insertion for tachy brady syndrome, 01/2009  . TONSILLECTOMY AND ADENOIDECTOMY    . TOTAL HIP ARTHROPLASTY Right 12/16/2014   Procedure: RIGHT TOTAL HIP ARTHROPLASTY ANTERIOR APPROACH;  Surgeon: Hessie Dibble, MD;  Location: [facility];  Service: Orthopedics;  Laterality: Right;  . TOTAL KNEE ARTHROPLASTY      OB History    No data available       Home Medications    Prior to Admission medications   Medication Sig Start Date End Date Taking? Authorizing Provider  acetaminophen (TYLENOL) 650 MG CR tablet Take 6,501,300 mg by mouth every 8 (eight) hours as needed for pain.    [provider]  CALCIUM PO Take 500 mg by mouth 2 (two) times daily.    [provider]  Cholecalciferol (VITAMIN D PO) Take 1 tablet by mouth daily.    [provider]  cyclobenzaprine (FLEXERIL) 5 MG tablet Take 1 tablet (5 mg total) by mouth 3 (three) times daily as needed for muscle spasms. 09/24/17   Elnora Morrison, MD  DILT-XR 180 MG 24 hr capsule Take 180 mg  by mouth daily. 03/02/17   [provider]  docusate sodium (COLACE) 100 MG capsule Take 100 mg by mouth 2 (two) times daily.     [provider]  furosemide (LASIX) 40 MG tablet Take 20 mg by mouth as needed for fluid.     [provider]  HYDROcodone-acetaminophen (NORCO/VICODIN) 5-325 MG per tablet Take 1-2 tablets by mouth every 4 (four) hours as needed (breakthrough pain). Patient not taking: Reported on 04/04/2017 12/19/14   Loni Dolly, PA-C  levothyroxine (SYNTHROID, LEVOTHROID) 25 MCG tablet TAKE 1 TABLET BY MOUTH DAILY 02/19/15    Lauree Chandler, NP  Menthol, Topical Analgesic, (BENGAY EX) Apply 1 application topically 2 (two) times daily as needed (leg pain).    [provider]  metoprolol succinate (TOPROL-XL) 100 MG 24 hr tablet TAKE 1 TABLET BY MOUTH EVERY DAY WITH OR IMMEDIATELY FOLLOWING A MEAL 09/15/17   Jerline Pain, MD  Multiple Vitamins-Minerals (ICAPS PO) Take 1 capsule by mouth daily.     [provider]  OVER THE COUNTER MEDICATION Take by mouth daily as needed (nasal drip). Tylenol Cold - Mucous Severe (dextromethorphan, acetaminophen, phenylephrine, guaifenesin) - takes early morning if she wakes up with a nasal drip (1 swig)    [provider]  warfarin (COUMADIN) 4 MG tablet USE AS DIRECTED BY COUMADIN CLINIC 08/28/17   Jerline Pain, MD    Family History Family History  Problem Relation Age of Onset  . Hypertension Mother   . Hypertension Father   . Cancer Sister   . Diabetes Sister     Social History Social History   Tobacco Use  . Smoking status: Never Smoker  . Smokeless tobacco: Never Used  Substance Use Topics  . Alcohol use: Yes    Comment: wine and beer occasionally  . Drug use: No     Allergies   Patient has no known allergies.   Review of Systems Review of Systems  Constitutional: Negative for chills and fever.  HENT: Negative for rhinorrhea and sore throat.   Eyes: Negative for pain and visual disturbance.  Respiratory: Negative for cough and shortness of breath.   Cardiovascular: Positive for palpitations. Negative for chest pain.  Gastrointestinal: Positive for nausea. Negative for abdominal pain and vomiting.  Genitourinary: Negative for dysuria and hematuria.  Musculoskeletal: Negative for back pain.  Skin: Negative for rash.  Neurological: Positive for light-headedness. Negative for seizures, syncope and headaches.  All other systems reviewed and are negative.    Physical Exam Updated Vital Signs BP 131/63   Pulse (!) 101    Temp (!) 97.5 F (36.4 C) (Oral)   Resp 18   LMP  (LMP Unknown)   SpO2 95%   Physical Exam  Constitutional: She appears well-developed and well-nourished. No distress.  HENT:  Head: Normocephalic and atraumatic.  Eyes: Conjunctivae are normal.  Neck: Neck supple.  Cardiovascular: Normal rate and regular rhythm.  No murmur heard. Afib in the 80s on exam  Pulmonary/Chest: Effort normal and breath sounds normal. No respiratory distress.  Abdominal: Soft. There is no tenderness.  Musculoskeletal: She exhibits no edema.  Neurological: She is alert.  Skin: Skin is warm and dry.  Psychiatric: She has a normal mood and affect.  Nursing note and vitals reviewed.    ED Treatments / Results  Labs (all labs ordered are listed, but only abnormal results are displayed) Labs Reviewed  I-STAT CHEM 8, ED - Abnormal; Notable for the following components:  Result Value   Chloride 98 (*)    All other components within normal limits    EKG  EKG Interpretation  Date/Time:  Monday October 16 2017 16:02:56 EST Ventricular Rate:  90 PR Interval:    QRS Duration: 160 QT Interval:  424 QTC Calculation: 519 R Axis:   -81 Text Interpretation:  regular rhythm, wide complex ?paced Confirmed by Pattricia Boss [id number]) on 10/16/2017 5:21:30 PM       Radiology No results found.  Procedures Procedures (including critical care time)  Medications Ordered in ED Medications - No data to display   Initial Impression / Assessment and Plan / ED Course  I have reviewed the triage vital signs and the nursing notes.  Pertinent labs & imaging results that were available during my care of the patient were reviewed by me and considered in my medical decision making (see chart for details).     Pt with h/o PAFib (on warfarin), HTN, PPM presents after an episode of palpitations, nausea, & lightheadedness. Pt says she has had similar episodes in the past, but today's lasted longer than normal,  so her family called 911; the Pt's symptoms resolved prior to arrival & she was asymptomatic during my exam. Denies F/C, HA, cough, diaphoresis, CP, SOB, V/D, urinary symptoms, or recent illness.  VS & exam as above. EKG w/wide-complex rhythm @ 90bpm that appears paced. Labs unremarkable. PPM interrogated & showed no significant events since 12/2 (3s of tachycardia).  On re-evaluation, Pt able to ambulate in the ED w/o difficulty.  Explained all results to the Pt & family. Will discharge the Pt home. Recommending follow-up with Cardiologist & PCP. ED return precautions provided. Pt acknowledged understanding of, and concurrence with the plan. All questions answered to her satisfaction. In stable condition at the time of discharge.  Final Clinical Impressions(s) / ED Diagnoses   Final diagnoses:  Paroxysmal atrial fibrillation [facility])    ED Discharge Orders    None       Jenny Reichmann, MD 10/16/17 [facility]ille [place]    Pattricia Boss, MD 10/17/17 [phone]

## 2017-10-16 NOTE — ED Triage Notes (Signed)
Pt here with c/o weakness . Dizziness and nausea ,. On arrival all symptoms have resolved , no chest pain

## 2017-10-18 ENCOUNTER — Ambulatory Visit (INDEPENDENT_AMBULATORY_CARE_PROVIDER_SITE_OTHER): Payer: Medicare Other

## 2017-10-18 DIAGNOSIS — Z5181 Encounter for therapeutic drug level monitoring: Secondary | ICD-10-CM

## 2017-10-18 DIAGNOSIS — I4891 Unspecified atrial fibrillation: Secondary | ICD-10-CM

## 2017-10-18 LAB — POCT INR: INR: 1.9

## 2017-10-18 NOTE — Patient Instructions (Signed)
Start taking same dosage 1 tablet every day except 1.5 tablets on Mondays, Wednesdays and Fridays. Recheck INR in 3 weeks. Call with any questions or concerns [phone]

## 2017-10-19 DIAGNOSIS — R55 Syncope and collapse: Secondary | ICD-10-CM | POA: Diagnosis not present

## 2017-10-23 ENCOUNTER — Telehealth: Payer: Self-pay

## 2017-10-23 NOTE — Telephone Encounter (Signed)
Contacted by Pt PCP on Friday 10/20/2017.  Asked to review Pt device results from 10/16/2017 between 12-1 pm.  Per review by device clinic, no abnormalities noted.  CVM for Pt, notified no abnormalities noted on device.  Left phone number for call back if any further questions.

## 2017-10-26 ENCOUNTER — Ambulatory Visit (INDEPENDENT_AMBULATORY_CARE_PROVIDER_SITE_OTHER): Payer: Medicare Other | Admitting: *Deleted

## 2017-10-26 DIAGNOSIS — I495 Sick sinus syndrome: Secondary | ICD-10-CM

## 2017-10-26 NOTE — Progress Notes (Signed)
Remote pacemaker transmission.   

## 2017-10-27 ENCOUNTER — Encounter: Payer: Self-pay | Admitting: Cardiology

## 2017-10-27 LAB — CUP PACEART REMOTE DEVICE CHECK
Battery Impedance: 3193 Ohm
Battery Remaining Longevity: 23 mo
Battery Voltage: 2.73 V
Brady Statistic RV Percent Paced: 33 %
Date Time Interrogation Session: 20181227171409
Implantable Lead Implant Date: 20100409
Implantable Lead Implant Date: 20100409
Implantable Lead Location: 753859
Implantable Lead Location: 753860
Implantable Lead Model: 5076
Implantable Lead Model: 5092
Implantable Pulse Generator Implant Date: 20100409
Lead Channel Impedance Value: 67 Ohm
Lead Channel Impedance Value: 675 Ohm
Lead Channel Pacing Threshold Amplitude: 0.875 V
Lead Channel Pacing Threshold Pulse Width: 0.4 ms
Lead Channel Sensing Intrinsic Amplitude: 11.2 mV
Lead Channel Setting Pacing Amplitude: 2 V
Lead Channel Setting Pacing Pulse Width: 0.4 ms
Lead Channel Setting Sensing Sensitivity: 5.6 mV

## 2017-11-15 DIAGNOSIS — Z0000 Encounter for general adult medical examination without abnormal findings: Secondary | ICD-10-CM | POA: Diagnosis not present

## 2017-11-15 DIAGNOSIS — Z1389 Encounter for screening for other disorder: Secondary | ICD-10-CM | POA: Diagnosis not present

## 2017-11-15 DIAGNOSIS — I10 Essential (primary) hypertension: Secondary | ICD-10-CM | POA: Diagnosis not present

## 2017-11-15 DIAGNOSIS — E039 Hypothyroidism, unspecified: Secondary | ICD-10-CM | POA: Diagnosis not present

## 2017-11-15 DIAGNOSIS — I480 Paroxysmal atrial fibrillation: Secondary | ICD-10-CM | POA: Diagnosis not present

## 2017-11-15 DIAGNOSIS — Z950 Presence of cardiac pacemaker: Secondary | ICD-10-CM | POA: Diagnosis not present

## 2017-11-15 DIAGNOSIS — M810 Age-related osteoporosis without current pathological fracture: Secondary | ICD-10-CM | POA: Diagnosis not present

## 2017-11-15 DIAGNOSIS — I495 Sick sinus syndrome: Secondary | ICD-10-CM | POA: Diagnosis not present

## 2017-11-16 ENCOUNTER — Ambulatory Visit (INDEPENDENT_AMBULATORY_CARE_PROVIDER_SITE_OTHER): Payer: Medicare Other

## 2017-11-16 DIAGNOSIS — I4891 Unspecified atrial fibrillation: Secondary | ICD-10-CM | POA: Diagnosis not present

## 2017-11-16 DIAGNOSIS — Z5181 Encounter for therapeutic drug level monitoring: Secondary | ICD-10-CM | POA: Diagnosis not present

## 2017-11-16 LAB — POCT INR: INR: 2.8

## 2017-11-16 NOTE — Patient Instructions (Signed)
Description   Continue on same dosage 1 tablet every day except 1.5 tablets on Mondays, Wednesdays and Fridays. Recheck INR in 5 weeks.  Call with any questions or concerns [phone]

## 2017-11-30 ENCOUNTER — Encounter (INDEPENDENT_AMBULATORY_CARE_PROVIDER_SITE_OTHER): Payer: Self-pay

## 2017-11-30 ENCOUNTER — Ambulatory Visit (INDEPENDENT_AMBULATORY_CARE_PROVIDER_SITE_OTHER): Payer: Medicare Other | Admitting: Cardiology

## 2017-11-30 ENCOUNTER — Encounter: Payer: Self-pay | Admitting: Cardiology

## 2017-11-30 VITALS — BP 124/62 | HR 70 | Ht 67.0 in | Wt 153.0 lb

## 2017-11-30 DIAGNOSIS — Z7901 Long term (current) use of anticoagulants: Secondary | ICD-10-CM

## 2017-11-30 DIAGNOSIS — I482 Chronic atrial fibrillation: Secondary | ICD-10-CM

## 2017-11-30 DIAGNOSIS — Z950 Presence of cardiac pacemaker: Secondary | ICD-10-CM

## 2017-11-30 DIAGNOSIS — I4821 Permanent atrial fibrillation: Secondary | ICD-10-CM

## 2017-11-30 NOTE — Patient Instructions (Signed)

## 2017-11-30 NOTE — Progress Notes (Signed)
[facility]. [address], [address] [place], [place]  [address] Phone: [phone] Fax:  [phone]  Date:  11/30/2017   ID:  Patricia Holt, Patricia Holt [birth year], MRN [id number]  PCP:  Lavone Orn, MD   History of Present Illness: Patricia Holt is a 84 y.o. female persistent atrial fibrillation, pacemaker dual chamber because of prior tachycardia bradycardia syndrome in April of 2010, chronic anticoagulation with bout of cardiomyopathy following cardioversion that spontaneously resolved here for followup.  On 11/30/11-her lisinopril was stopped because of chronic cough. Occasional cough still persisted. Aortic valve showed moderate aortic insufficiency. 8/11 echocardiogram. Furosemide 40 mg to be taken on as-needed basis if weight increased.  Loosing balance so stopped golf. Walking daily. No changes with breathing.   Sometimes when she wakes up in the morning she may have some mild wheezing.  She enjoyed going to [employer], [employer], weding of her grandson.   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 This resulted in vertigo. At one point they thought that she had a brain tumor but they decided not to operate. She had an abscess in her posterior right cranium. She states that these were the days before penicillin.  Overall she's having no chest pain, no shortness of breath. She has been battling with severe allergies. No spray sometimes give her nosebleeds. She sits up sometimes at night because of the mucus production.  11/30/17 -She was in the emergency department on 10/16/17 with atrial fibrillation sensation however EKG showed regular rhythm at 90 no acute ischemia noted. She felt pale.  She went home.  No further occurrences.  She is having no chest pain, no shortness of breath.  Occasional sinus issues.  Still using Lasix as needed if weight goes up 2 pounds.  Has some lower extremity edema.   Wt Readings from Last 3  Encounters:  11/30/17 153 lb (69.4 kg)  03/08/17 152 lb 6.4 oz (69.1 kg)  01/25/17 156 lb 12.8 oz (71.1 kg)     Past Medical History:  Diagnosis Date  . Aortic valve disorder   . Atrial fibrillation (HCC)    paroxysmal, status post successful vessel cardioversion 5/09, Janeil Schexnayder,transient left ventricular systolic dysfunction, likely secondary to cardioversion/stunning of the myocardium, no evidence significant CAD, echo 7/09 shows normal ejection fraction  . Carpal tunnel syndrome   . Chronic anticoagulation   . DJD (degenerative joint disease)   . Dysrhythmia   . GERD (gastroesophageal reflux disease)    OTC occasionally  . Goiter   . HTN (hypertension)   . Hyperlipidemia   . Hypothyroidism   . Macular degeneration   . Mitral regurgitation   . Osteoporosis    , severe, hypercalciuria, Boniva since 2005, T10 compression fracture 2009  . Other disorder of calcium metabolism   . Polymyalgia rheumatica ([place])   . Presence of permanent cardiac pacemaker   . Sinoatrial node dysfunction (HCC)   . Sinus drainage    in the morning, clear drainage    Past Surgical History:  Procedure Laterality Date  . CARPAL TUNNEL RELEASE    . DILATION AND CURETTAGE OF UTERUS    . EYE SURGERY Bilateral    cataracts with lens   . FEMUR FRACTURE SURGERY    . PACEMAKER INSERTION     Dual chamber permanent pacer insertion for tachy brady syndrome, 01/2009  . TONSILLECTOMY AND ADENOIDECTOMY    . TOTAL HIP ARTHROPLASTY  Right 12/16/2014   Procedure: RIGHT TOTAL HIP ARTHROPLASTY ANTERIOR APPROACH;  Surgeon: Hessie Dibble, MD;  Location: [facility];  Service: Orthopedics;  Laterality: Right;  . TOTAL KNEE ARTHROPLASTY      Current Outpatient Medications  Medication Sig Dispense Refill  . acetaminophen (TYLENOL) 650 MG CR tablet Take 6,501,300 mg by mouth every 8 (eight) hours as needed for pain.    Marland Kitchen CALCIUM PO Take 500 mg by mouth 2 (two) times daily.    . Cholecalciferol (VITAMIN D PO) Take 1 tablet by  mouth daily.    . cyclobenzaprine (FLEXERIL) 5 MG tablet Take 1 tablet (5 mg total) by mouth 3 (three) times daily as needed for muscle spasms. 10 tablet 0  . DILT-XR 180 MG 24 hr capsule Take 180 mg by mouth daily.  10  . docusate sodium (COLACE) 100 MG capsule Take 100 mg by mouth 2 (two) times daily.     . furosemide (LASIX) 40 MG tablet Take 20 mg by mouth as needed for fluid.     Marland Kitchen HYDROcodone-acetaminophen (NORCO/VICODIN) 5-325 MG per tablet Take 1-2 tablets by mouth every 4 (four) hours as needed (breakthrough pain). 50 tablet 0  . levothyroxine (SYNTHROID, LEVOTHROID) 25 MCG tablet TAKE 1 TABLET BY MOUTH DAILY 30 tablet 3  . Menthol, Topical Analgesic, (BENGAY EX) Apply 1 application topically 2 (two) times daily as needed (leg pain).    . metoprolol succinate (TOPROL-XL) 100 MG 24 hr tablet TAKE 1 TABLET BY MOUTH EVERY DAY WITH OR IMMEDIATELY FOLLOWING A MEAL 90 tablet 1  . Multiple Vitamins-Minerals (ICAPS PO) Take 1 capsule by mouth daily.     Marland Kitchen OVER THE COUNTER MEDICATION Take by mouth daily as needed (nasal drip). Tylenol Cold - Mucous Severe (dextromethorphan, acetaminophen, phenylephrine, guaifenesin) - takes early morning if she wakes up with a nasal drip (1 swig)    . warfarin (COUMADIN) 4 MG tablet USE AS DIRECTED BY COUMADIN CLINIC 40 tablet 3   No current facility-administered medications for this visit.     Allergies:   No Known Allergies  Social History:  The patient  reports that  has never smoked. she has never used smokeless tobacco. She reports that she drinks alcohol. She reports that she does not use drugs.   ROS:  Unless specified above all other review of systems negative  PHYSICAL EXAM: VS:  BP 124/62   Pulse 70   Ht 5\' 7"  (1.702 m)   Wt 153 lb (69.4 kg)   LMP  (LMP Unknown)   SpO2 95%   BMI 23.96 kg/m   GEN: Well nourished, well developed, in no acute distress, using walker HEENT: normal  Neck: no JVD, carotid bruits, or masses Cardiac: RRR; no  murmurs, rubs, or gallops,mild R>L LE edema  Respiratory:  clear to auscultation bilaterally, normal work of breathing GI: soft, nontender, nondistended, + BS MS: no deformity or atrophy  Skin: warm and dry, no rash Neuro:  Alert and Oriented x 3, Strength and sensation are intact Psych: euthymic mood, full affect   EKG: Previous 10/16/17 - AFIB 90 bpm paced personally viewed. 09/07/16-heart rate 79 bpm atrial fibrillation underlying, paced rhythm. 09/09/15-atrial fibrillation heart rate 95 bpm, isolated ventricular paced beat. 09/17/14- Atrial fibrillation 94, left axis deviation,no ST segment changes, no significant change from prior  ASSESSMENT AND PLAN:  1. Pacemaker - Dr. Lovena Le, 2011. Stable. Device working well. Dr. Tanna Furry note reviewed. No changes 2. Atrial fibrillation-permanent, chronic. Doing well. Well rate controlled on  current medications. No changes made. CHADS-VASc - 4 (hypertension, age, female) no symptoms. Contemplated decreasing her diltiazem in the past to maybe help improve her lower extremity edema however she has had ventricular high rates on pacemaker. We will continue. Recent ER visit with normal rate, paced.  Perhaps she had a mild vagal episode which led to her ER visit.  I told her to be careful with the Lasix as needed. 3. Chronic anticoagulation-no bleeding. Continue to monitor closely. Monitored here. Not interested in novel oral anticoagulation. Stable 4. Hypertension- Doing well. Prior ACE-I cough. However her cough persisted. She does have rhinorrhea. Potentially allergies. Dr. Laurann Montana has her on Flonase. No change 5. Aortic regurgitation-mild to moderate. No change clinically. No need for repeat echocardiogram at this time. No change 6. Pedal edema-she is taking furosemide as needed especially if weight increases 2 pounds, Dr. Laurann Montana.  Using support hose. Elevate legs. No change 7. Six-month follow-up  Signed, Candee Furbish, MD [facility]  11/30/2017 1:49 PM

## 2017-12-18 DIAGNOSIS — M1712 Unilateral primary osteoarthritis, left knee: Secondary | ICD-10-CM | POA: Diagnosis not present

## 2017-12-20 ENCOUNTER — Ambulatory Visit (INDEPENDENT_AMBULATORY_CARE_PROVIDER_SITE_OTHER): Payer: Medicare Other | Admitting: *Deleted

## 2017-12-20 DIAGNOSIS — I4891 Unspecified atrial fibrillation: Secondary | ICD-10-CM

## 2017-12-20 DIAGNOSIS — Z5181 Encounter for therapeutic drug level monitoring: Secondary | ICD-10-CM

## 2017-12-20 LAB — POCT INR
INR: 4.9
INR: 4.9

## 2017-12-20 NOTE — Patient Instructions (Signed)
Description   Do not take coumadin Feb 20th and no coumadin on Feb 21st then continue on same dosage 1 tablet every day except 1.5 tablets on Mondays, Wednesdays and Fridays. Recheck INR in 1 week.  Call with any questions or concerns [phone] Do extra serving of greens today and tomorrow then keep intake of greens consistent  Monitor for any bleeding and go to ER if seen

## 2017-12-21 ENCOUNTER — Other Ambulatory Visit: Payer: Self-pay | Admitting: Cardiology

## 2017-12-29 ENCOUNTER — Ambulatory Visit (INDEPENDENT_AMBULATORY_CARE_PROVIDER_SITE_OTHER): Payer: Medicare Other | Admitting: Pharmacist

## 2017-12-29 DIAGNOSIS — I4891 Unspecified atrial fibrillation: Secondary | ICD-10-CM | POA: Diagnosis not present

## 2017-12-29 DIAGNOSIS — Z5181 Encounter for therapeutic drug level monitoring: Secondary | ICD-10-CM

## 2017-12-29 LAB — POCT INR: INR: 3.4

## 2017-12-29 NOTE — Patient Instructions (Signed)
Description   Skip your Coumadin today then continue on same dosage 1 tablet every day except 1.5 tablets on Mondays, Wednesdays and Fridays. Recheck INR in 3 weeks per patient request.  Call with any questions or concerns [phone] Do extra serving of greens today and tomorrow then keep intake of greens consistent  Monitor for any bleeding and go to ER if seen

## 2018-01-09 DIAGNOSIS — N390 Urinary tract infection, site not specified: Secondary | ICD-10-CM | POA: Diagnosis not present

## 2018-01-19 ENCOUNTER — Ambulatory Visit (INDEPENDENT_AMBULATORY_CARE_PROVIDER_SITE_OTHER): Payer: Medicare Other | Admitting: *Deleted

## 2018-01-19 DIAGNOSIS — Z5181 Encounter for therapeutic drug level monitoring: Secondary | ICD-10-CM | POA: Diagnosis not present

## 2018-01-19 DIAGNOSIS — I4891 Unspecified atrial fibrillation: Secondary | ICD-10-CM | POA: Diagnosis not present

## 2018-01-19 LAB — POCT INR: INR: 4.4

## 2018-01-19 NOTE — Patient Instructions (Signed)
Description   Do not take coumadin today March 22nd and no coumadin on March 23rd then change dose of coumadin to 1 tablet every day except 1.5 tablets only on  Mondays and Fridays. Recheck INR in 2 weeks .  Call with any questions or concerns [phone] Do extra serving of greens today and tomorrow then keep intake of greens consistent  Monitor for any bleeding and go to ER if seen

## 2018-01-25 ENCOUNTER — Encounter: Payer: Medicare Other | Admitting: *Deleted

## 2018-01-26 ENCOUNTER — Encounter: Payer: Self-pay | Admitting: Cardiology

## 2018-02-02 ENCOUNTER — Ambulatory Visit (INDEPENDENT_AMBULATORY_CARE_PROVIDER_SITE_OTHER): Payer: Medicare Other | Admitting: *Deleted

## 2018-02-02 DIAGNOSIS — Z5181 Encounter for therapeutic drug level monitoring: Secondary | ICD-10-CM | POA: Diagnosis not present

## 2018-02-02 DIAGNOSIS — I4891 Unspecified atrial fibrillation: Secondary | ICD-10-CM | POA: Diagnosis not present

## 2018-02-02 LAB — POCT INR: INR: 1.9

## 2018-02-02 NOTE — Patient Instructions (Addendum)
Description   Today take 2 tablets then continue taking 1 tablet every day except 1.5 tablets only on  Mondays and Fridays. Recheck INR in 2 weeks.  Call with any questions or concerns [phone]

## 2018-02-05 ENCOUNTER — Ambulatory Visit (INDEPENDENT_AMBULATORY_CARE_PROVIDER_SITE_OTHER): Payer: Medicare Other | Admitting: *Deleted

## 2018-02-05 DIAGNOSIS — I482 Chronic atrial fibrillation: Secondary | ICD-10-CM | POA: Diagnosis not present

## 2018-02-05 DIAGNOSIS — I4821 Permanent atrial fibrillation: Secondary | ICD-10-CM

## 2018-02-06 NOTE — Progress Notes (Signed)
Remote pacemaker transmission.   

## 2018-02-08 ENCOUNTER — Encounter: Payer: Self-pay | Admitting: Cardiology

## 2018-02-08 NOTE — Progress Notes (Signed)
Letter  

## 2018-02-12 DIAGNOSIS — I480 Paroxysmal atrial fibrillation: Secondary | ICD-10-CM | POA: Diagnosis not present

## 2018-02-12 DIAGNOSIS — M159 Polyosteoarthritis, unspecified: Secondary | ICD-10-CM | POA: Diagnosis not present

## 2018-02-12 DIAGNOSIS — E039 Hypothyroidism, unspecified: Secondary | ICD-10-CM | POA: Diagnosis not present

## 2018-02-12 DIAGNOSIS — E782 Mixed hyperlipidemia: Secondary | ICD-10-CM | POA: Diagnosis not present

## 2018-02-12 DIAGNOSIS — I10 Essential (primary) hypertension: Secondary | ICD-10-CM | POA: Diagnosis not present

## 2018-02-12 DIAGNOSIS — M810 Age-related osteoporosis without current pathological fracture: Secondary | ICD-10-CM | POA: Diagnosis not present

## 2018-02-15 LAB — CUP PACEART REMOTE DEVICE CHECK
Battery Impedance: 3343 Ohm
Battery Remaining Longevity: 21 mo
Battery Voltage: 2.72 V
Brady Statistic RV Percent Paced: 32 %
Date Time Interrogation Session: 20190405144314
Implantable Lead Implant Date: 20100409
Implantable Lead Implant Date: 20100409
Implantable Lead Location: 753859
Implantable Lead Location: 753860
Implantable Lead Model: 5076
Implantable Lead Model: 5092
Implantable Pulse Generator Implant Date: 20100409
Lead Channel Impedance Value: 67 Ohm
Lead Channel Impedance Value: 689 Ohm
Lead Channel Pacing Threshold Amplitude: 0.75 V
Lead Channel Pacing Threshold Pulse Width: 0.4 ms
Lead Channel Sensing Intrinsic Amplitude: 5.6 mV
Lead Channel Setting Pacing Amplitude: 2 V
Lead Channel Setting Pacing Pulse Width: 0.4 ms
Lead Channel Setting Sensing Sensitivity: 4 mV

## 2018-02-19 ENCOUNTER — Ambulatory Visit (INDEPENDENT_AMBULATORY_CARE_PROVIDER_SITE_OTHER): Payer: Medicare Other | Admitting: *Deleted

## 2018-02-19 DIAGNOSIS — I4891 Unspecified atrial fibrillation: Secondary | ICD-10-CM | POA: Diagnosis not present

## 2018-02-19 DIAGNOSIS — Z5181 Encounter for therapeutic drug level monitoring: Secondary | ICD-10-CM | POA: Diagnosis not present

## 2018-02-19 LAB — POCT INR: INR: 3.6

## 2018-02-19 NOTE — Patient Instructions (Signed)
Description   Do not take any Coumadin today then continue taking 1 tablet every day except 1.5 tablets only on  Mondays and Fridays. Recheck INR in 2 weeks.  Call with any questions or concerns [phone]

## 2018-02-21 DIAGNOSIS — H43813 Vitreous degeneration, bilateral: Secondary | ICD-10-CM | POA: Diagnosis not present

## 2018-02-21 DIAGNOSIS — H35421 Microcystoid degeneration of retina, right eye: Secondary | ICD-10-CM | POA: Diagnosis not present

## 2018-02-21 DIAGNOSIS — H353232 Exudative age-related macular degeneration, bilateral, with inactive choroidal neovascularization: Secondary | ICD-10-CM | POA: Diagnosis not present

## 2018-03-20 DIAGNOSIS — M159 Polyosteoarthritis, unspecified: Secondary | ICD-10-CM | POA: Diagnosis not present

## 2018-03-20 DIAGNOSIS — M169 Osteoarthritis of hip, unspecified: Secondary | ICD-10-CM | POA: Diagnosis not present

## 2018-03-20 DIAGNOSIS — E782 Mixed hyperlipidemia: Secondary | ICD-10-CM | POA: Diagnosis not present

## 2018-03-20 DIAGNOSIS — I10 Essential (primary) hypertension: Secondary | ICD-10-CM | POA: Diagnosis not present

## 2018-03-20 DIAGNOSIS — I480 Paroxysmal atrial fibrillation: Secondary | ICD-10-CM | POA: Diagnosis not present

## 2018-03-20 DIAGNOSIS — M810 Age-related osteoporosis without current pathological fracture: Secondary | ICD-10-CM | POA: Diagnosis not present

## 2018-03-20 DIAGNOSIS — E039 Hypothyroidism, unspecified: Secondary | ICD-10-CM | POA: Diagnosis not present

## 2018-04-02 ENCOUNTER — Ambulatory Visit (INDEPENDENT_AMBULATORY_CARE_PROVIDER_SITE_OTHER): Payer: Medicare Other | Admitting: Pharmacist

## 2018-04-02 DIAGNOSIS — I4891 Unspecified atrial fibrillation: Secondary | ICD-10-CM

## 2018-04-02 DIAGNOSIS — Z5181 Encounter for therapeutic drug level monitoring: Secondary | ICD-10-CM

## 2018-04-02 LAB — POCT INR: INR: 2.6 (ref 2.0–3.0)

## 2018-04-02 NOTE — Patient Instructions (Signed)
Description   Continue taking 1 tablet every day except 1.5 tablets only on  Mondays and Fridays. Recheck INR in 6 weeks - pt will only come back in 6 weeks.  Call with any questions or concerns [phone]      

## 2018-05-07 ENCOUNTER — Ambulatory Visit (INDEPENDENT_AMBULATORY_CARE_PROVIDER_SITE_OTHER): Payer: Medicare Other | Admitting: *Deleted

## 2018-05-07 DIAGNOSIS — I495 Sick sinus syndrome: Secondary | ICD-10-CM | POA: Diagnosis not present

## 2018-05-08 NOTE — Progress Notes (Signed)
Remote pacemaker transmission.   

## 2018-05-11 LAB — CUP PACEART REMOTE DEVICE CHECK
Battery Impedance: 4000 Ohm
Battery Remaining Longevity: 15 mo
Battery Voltage: 2.7 V
Brady Statistic RV Percent Paced: 31 %
Date Time Interrogation Session: 20190708144941
Implantable Lead Implant Date: 20100409
Implantable Lead Implant Date: 20100409
Implantable Lead Location: 753859
Implantable Lead Location: 753860
Implantable Lead Model: 5076
Implantable Lead Model: 5092
Implantable Pulse Generator Implant Date: 20100409
Lead Channel Impedance Value: 67 Ohm
Lead Channel Impedance Value: 684 Ohm
Lead Channel Pacing Threshold Amplitude: 0.875 V
Lead Channel Pacing Threshold Pulse Width: 0.4 ms
Lead Channel Setting Pacing Amplitude: 2 V
Lead Channel Setting Pacing Pulse Width: 0.4 ms
Lead Channel Setting Sensing Sensitivity: 2.8 mV

## 2018-05-13 DIAGNOSIS — R05 Cough: Secondary | ICD-10-CM | POA: Diagnosis not present

## 2018-05-13 DIAGNOSIS — N390 Urinary tract infection, site not specified: Secondary | ICD-10-CM | POA: Diagnosis not present

## 2018-05-14 ENCOUNTER — Ambulatory Visit (INDEPENDENT_AMBULATORY_CARE_PROVIDER_SITE_OTHER): Payer: Medicare Other | Admitting: *Deleted

## 2018-05-14 DIAGNOSIS — Z5181 Encounter for therapeutic drug level monitoring: Secondary | ICD-10-CM

## 2018-05-14 DIAGNOSIS — I4891 Unspecified atrial fibrillation: Secondary | ICD-10-CM | POA: Diagnosis not present

## 2018-05-14 LAB — POCT INR: INR: 2.1 (ref 2.0–3.0)

## 2018-05-14 NOTE — Patient Instructions (Signed)
Description   Continue taking 1 tablet every day except 1.5 tablets only on  Mondays and Fridays. Recheck INR in 6 weeks - pt will only come back in 6 weeks.  Call with any questions or concerns [phone]

## 2018-05-15 ENCOUNTER — Encounter: Payer: Self-pay | Admitting: Cardiology

## 2018-05-15 ENCOUNTER — Ambulatory Visit (HOSPITAL_COMMUNITY): Payer: Medicare Other | Attending: Cardiovascular Disease

## 2018-05-15 ENCOUNTER — Ambulatory Visit (INDEPENDENT_AMBULATORY_CARE_PROVIDER_SITE_OTHER): Payer: Medicare Other | Admitting: Cardiology

## 2018-05-15 ENCOUNTER — Encounter (INDEPENDENT_AMBULATORY_CARE_PROVIDER_SITE_OTHER): Payer: Self-pay

## 2018-05-15 ENCOUNTER — Other Ambulatory Visit: Payer: Self-pay

## 2018-05-15 VITALS — BP 138/60 | HR 74 | Ht 67.0 in | Wt 151.0 lb

## 2018-05-15 DIAGNOSIS — R0600 Dyspnea, unspecified: Secondary | ICD-10-CM | POA: Diagnosis not present

## 2018-05-15 DIAGNOSIS — I4891 Unspecified atrial fibrillation: Secondary | ICD-10-CM | POA: Diagnosis not present

## 2018-05-15 DIAGNOSIS — I083 Combined rheumatic disorders of mitral, aortic and tricuspid valves: Secondary | ICD-10-CM | POA: Insufficient documentation

## 2018-05-15 DIAGNOSIS — I10 Essential (primary) hypertension: Secondary | ICD-10-CM | POA: Insufficient documentation

## 2018-05-15 DIAGNOSIS — I359 Nonrheumatic aortic valve disorder, unspecified: Secondary | ICD-10-CM

## 2018-05-15 LAB — ECHOCARDIOGRAM COMPLETE
Height: 67 in
Weight: 2416 oz

## 2018-05-15 MED ORDER — FUROSEMIDE 40 MG PO TABS: 40.0000 mg | ORAL_TABLET | Freq: Every day | ORAL | 3 refills | Status: DC

## 2018-05-15 NOTE — Patient Instructions (Addendum)
Medication Instructions:  Please increase Furosemide to 40 mg a day. Continue all other medications as listed.  Testing/Procedures: Your physician has requested that you have an echocardiogram. Echocardiography is a painless test that uses sound waves to create images of your heart. It provides your doctor with information about the size and shape of your heart and how well your heart's chambers and valves are working. This procedure takes approximately one hour. There are no restrictions for this procedure.  Follow-Up: Follow up in 2 weeks with Dr Marlou Porch  If you need a refill on your cardiac medications before your next appointment, please call your pharmacy.  Thank you for choosing [facility]!!

## 2018-05-15 NOTE — Progress Notes (Signed)
[facility]. [address] [place], [place]  [address] Phone: [phone] Fax:  [phone]  Date:  05/15/2018   ID:  Patricia Holt Apr 05, 1924, MRN [id number]  PCP:  Lavone Orn, MD   History of Present Illness: Patricia Holt is a 84 y.o. female persistent atrial fibrillation, pacemaker dual chamber because of prior tachycardia bradycardia syndrome in April of 2010, chronic anticoagulation with bout of cardiomyopathy following cardioversion that spontaneously resolved here for followup.  On 11/30/11-her lisinopril was stopped because of chronic cough. Occasional cough still persisted. Aortic valve showed moderate aortic insufficiency. 8/11 echocardiogram. Furosemide 40 mg to be taken on as-needed basis if weight increased.  Loosing balance so stopped golf. Walking daily. No changes with breathing.   Sometimes when she wakes up in the morning she may have some mild wheezing.  She enjoyed going to [employer], [employer], [place] of her grandson.   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 This resulted in vertigo. At one point they thought that she had a brain tumor but they decided not to operate. She had an abscess in her posterior right cranium. She states that these were the days before penicillin.  Overall she's having no chest pain, no shortness of breath. She has been battling with severe allergies. No spray sometimes give her nosebleeds. She sits up sometimes at night because of the mucus production.  11/30/17 -She was in the emergency department on 10/16/17 with atrial fibrillation sensation however EKG showed regular rhythm at 90 no acute ischemia noted. She felt pale.  She went home.  No further occurrences.  She is having no chest pain, no shortness of breath.  Occasional sinus issues.  Still using Lasix as needed if weight goes up 2 pounds.  Has some lower extremity edema.  05/15/18 - having trouble  breathing. Turned AC down. New for you. No CP. Recent UTI.  CXR was done and fine. Edema in ankles noted.  Clear mucus in AM.    Wt Readings from Last 3 Encounters:  05/15/18 151 lb (68.5 kg)  11/30/17 153 lb (69.4 kg)  03/08/17 152 lb 6.4 oz (69.1 kg)     Past Medical History:  Diagnosis Date  . Aortic valve disorder   . Atrial fibrillation (HCC)    paroxysmal, status post successful vessel cardioversion 5/09, Patricia Holt,transient left ventricular systolic dysfunction, likely secondary to cardioversion/stunning of the myocardium, no evidence significant CAD, echo 7/09 shows normal ejection fraction  . Carpal tunnel syndrome   . Chronic anticoagulation   . DJD (degenerative joint disease)   . Dysrhythmia   . GERD (gastroesophageal reflux disease)    OTC occasionally  . Goiter   . HTN (hypertension)   . Hyperlipidemia   . Hypothyroidism   . Macular degeneration   . Mitral regurgitation   . Osteoporosis    , severe, hypercalciuria, Boniva since 2005, T10 compression fracture 2009  . Other disorder of calcium metabolism   . Polymyalgia rheumatica ([place])   . Presence of permanent cardiac pacemaker   . Sinoatrial node dysfunction (HCC)   . Sinus drainage    in the morning, clear drainage    Past Surgical History:  Procedure Laterality Date  . CARPAL TUNNEL RELEASE    . DILATION AND CURETTAGE OF UTERUS    . EYE SURGERY Bilateral    cataracts with lens   . FEMUR FRACTURE SURGERY    .  PACEMAKER INSERTION     Dual chamber permanent pacer insertion for tachy brady syndrome, 01/2009  . TONSILLECTOMY AND ADENOIDECTOMY    . TOTAL HIP ARTHROPLASTY Right 12/16/2014   Procedure: RIGHT TOTAL HIP ARTHROPLASTY ANTERIOR APPROACH;  Surgeon: Hessie Dibble, MD;  Location: [facility];  Service: Orthopedics;  Laterality: Right;  . TOTAL KNEE ARTHROPLASTY      Current Outpatient Medications  Medication Sig Dispense Refill  . acetaminophen (TYLENOL) 650 MG CR tablet Take 6,501,300 mg by mouth every 8  (eight) hours as needed for pain.    Marland Kitchen azelastine (ASTELIN) 0.1 % nasal spray Place 2 sprays into both nostrils 2 (two) times daily.  5  . CALCIUM PO Take 500 mg by mouth 2 (two) times daily.    . Cholecalciferol (VITAMIN D PO) Take 1 tablet by mouth daily.    . cyclobenzaprine (FLEXERIL) 5 MG tablet Take 1 tablet (5 mg total) by mouth 3 (three) times daily as needed for muscle spasms. 10 tablet 0  . DILT-XR 180 MG 24 hr capsule Take 180 mg by mouth daily.  10  . docusate sodium (COLACE) 100 MG capsule Take 100 mg by mouth 2 (two) times daily.     . furosemide (LASIX) 40 MG tablet Take 20 mg by mouth as needed for fluid.     Marland Kitchen HYDROcodone-acetaminophen (NORCO/VICODIN) 5-325 MG per tablet Take 1-2 tablets by mouth every 4 (four) hours as needed (breakthrough pain). 50 tablet 0  . levothyroxine (SYNTHROID, LEVOTHROID) 25 MCG tablet TAKE 1 TABLET BY MOUTH DAILY 30 tablet 3  . Menthol, Topical Analgesic, (BENGAY EX) Apply 1 application topically 2 (two) times daily as needed (leg pain).    . metoprolol succinate (TOPROL-XL) 100 MG 24 hr tablet TAKE 1 TABLET BY MOUTH EVERY DAY WITH OR IMMEDIATELY FOLLOWING A MEAL 90 tablet 1  . Multiple Vitamins-Minerals (ICAPS PO) Take 1 capsule by mouth daily.     . nitrofurantoin, macrocrystal-monohydrate, (MACROBID) 100 MG capsule Take 1 capsule by mouth daily.  0  . OVER THE COUNTER MEDICATION Take by mouth daily as needed (nasal drip). Tylenol Cold - Mucous Severe (dextromethorphan, acetaminophen, phenylephrine, guaifenesin) - takes early morning if she wakes up with a nasal drip (1 swig)    . potassium chloride (K-DUR,KLOR-CON) 10 MEQ tablet Take 1 tablet by mouth daily.  1  . tiZANidine (ZANAFLEX) 2 MG tablet Take 1 tablet by mouth 2 (two) times daily.  5  . warfarin (COUMADIN) 4 MG tablet USE AS DIRECTED BY COUMADIN CLINIC 40 tablet 3   No current facility-administered medications for this visit.     Allergies:   No Known Allergies  Social History:  The  patient  reports that she has never smoked. She has never used smokeless tobacco. She reports that she drinks alcohol. She reports that she does not use drugs.   ROS:  Unless specified above all other review of systems negative  PHYSICAL EXAM: VS:  BP 138/60   Pulse 74   Ht 5\' 7"  (1.702 m)   Wt 151 lb (68.5 kg)   LMP  (LMP Unknown)   SpO2 96%   BMI 23.65 kg/m   GEN: Well nourished, well developed, in no acute distress, using walker HEENT: normal  Neck: no JVD, carotid bruits, or masses Cardiac: RRR; no murmurs, rubs, or gallops,mild R>L LE edema  Respiratory:  clear to auscultation bilaterally, normal work of breathing GI: soft, nontender, nondistended, + BS MS: no deformity or atrophy  Skin: warm  and dry, no rash Neuro:  Alert and Oriented x 3, Strength and sensation are intact Psych: euthymic mood, full affect   EKG: Previous 10/16/17 - AFIB 90 bpm paced personally viewed. 09/07/16-heart rate 79 bpm atrial fibrillation underlying, paced rhythm. 09/09/15-atrial fibrillation heart rate 95 bpm, isolated ventricular paced beat. 09/17/14- Atrial fibrillation 94, left axis deviation,no ST segment changes, no significant change from prior  ASSESSMENT AND PLAN:  1. Dyspnea on exertion-we will check an echocardiogram to ensure that she has not developed cardiomyopathy especially with her increased lower extremity swelling as well.  We will go ahead and place her on Lasix 40 mg once a day.  She used to take this as needed.  In 2 weeks we will have her come back and see me with a basic metabolic profile. 2. Pacemaker - Dr. Lovena Le, 2011. Stable. Device working well. Dr. Tanna Furry note reviewed. No changes 3. Atrial fibrillation-permanent, chronic. Doing well. Well rate controlled on current medications. No changes made. CHADS-VASc - 4 (hypertension, age, female) no symptoms. Contemplated decreasing her diltiazem in the past to maybe help improve her lower extremity edema however she has had  ventricular high rates on pacemaker. We will continue. Recent ER visit with normal rate, paced.  Perhaps she had a mild vagal episode which led to her ER visit.  I told her to be careful with the Lasix as needed. 4. Chronic anticoagulation-no bleeding. Continue to monitor closely. Monitored here. Not interested in novel oral anticoagulation. Stable 5. Hypertension- Doing well. Prior ACE-I cough. However her cough persisted. She does have rhinorrhea. Potentially allergies. Dr. Laurann Montana has her on Flonase. No change 6. Aortic regurgitation-mild to moderate. No change clinically. No need for repeat echocardiogram at this time. No change 7. Pedal edema-she is taking furosemide as needed especially if weight increases 2 pounds, Dr. Laurann Montana.  Using support hose. Elevate legs. No change 8. Six-month follow-up  Signed, Candee Furbish, MD [facility]  05/15/2018 12:19 PM

## 2018-05-18 ENCOUNTER — Telehealth: Payer: Self-pay | Admitting: Cardiology

## 2018-05-18 NOTE — Telephone Encounter (Signed)
Spoke with daughter-in-law Gwinda Passe on dpr.  Reviewed the results of pt's echo.  She states understanding and that the patient just wanted her to call to make sure she understood the results.

## 2018-05-18 NOTE — Telephone Encounter (Signed)
New Message    Patients daughter is calling to obtain her mothers lab information. She just wants to be clear as to what is recommended and how her mothers labs were. Please call to discuss.

## 2018-06-04 DIAGNOSIS — I10 Essential (primary) hypertension: Secondary | ICD-10-CM | POA: Diagnosis not present

## 2018-06-04 DIAGNOSIS — I480 Paroxysmal atrial fibrillation: Secondary | ICD-10-CM | POA: Diagnosis not present

## 2018-06-04 DIAGNOSIS — R0982 Postnasal drip: Secondary | ICD-10-CM | POA: Diagnosis not present

## 2018-06-04 DIAGNOSIS — R300 Dysuria: Secondary | ICD-10-CM | POA: Diagnosis not present

## 2018-06-04 DIAGNOSIS — Z7189 Other specified counseling: Secondary | ICD-10-CM | POA: Diagnosis not present

## 2018-06-04 DIAGNOSIS — R0609 Other forms of dyspnea: Secondary | ICD-10-CM | POA: Diagnosis not present

## 2018-06-12 ENCOUNTER — Encounter: Payer: Self-pay | Admitting: Cardiology

## 2018-06-12 ENCOUNTER — Ambulatory Visit (INDEPENDENT_AMBULATORY_CARE_PROVIDER_SITE_OTHER): Payer: Medicare Other | Admitting: Cardiology

## 2018-06-12 VITALS — BP 124/58 | HR 83 | Ht 67.0 in | Wt 150.0 lb

## 2018-06-12 DIAGNOSIS — I482 Chronic atrial fibrillation: Secondary | ICD-10-CM | POA: Diagnosis not present

## 2018-06-12 DIAGNOSIS — Z7901 Long term (current) use of anticoagulants: Secondary | ICD-10-CM | POA: Diagnosis not present

## 2018-06-12 DIAGNOSIS — I359 Nonrheumatic aortic valve disorder, unspecified: Secondary | ICD-10-CM

## 2018-06-12 DIAGNOSIS — Z950 Presence of cardiac pacemaker: Secondary | ICD-10-CM

## 2018-06-12 DIAGNOSIS — I4821 Permanent atrial fibrillation: Secondary | ICD-10-CM

## 2018-06-12 NOTE — Patient Instructions (Signed)

## 2018-06-12 NOTE — Progress Notes (Signed)
[facility]. [address], [address], [place]  [address] Phone: [phone] Fax:  [phone]  Date:  06/12/2018   ID:  Meghin, Thivierge Aug 16, 1924, MRN [id number]  PCP:  Lavone Orn, MD   History of Present Illness: Patricia Holt is a 84 y.o. female persistent atrial fibrillation, pacemaker dual chamber because of prior tachycardia bradycardia syndrome in April of 2010, chronic anticoagulation with bout of cardiomyopathy following cardioversion that spontaneously resolved here for followup.  On 11/30/11-her lisinopril was stopped because of chronic cough. Occasional cough still persisted. Aortic valve showed moderate aortic insufficiency. 8/11 echocardiogram. Furosemide 40 mg to be taken on as-needed basis if weight increased.  Loosing balance so stopped golf. Walking daily. No changes with breathing.   Sometimes when she wakes up in the morning she may have some mild wheezing.  She enjoyed going to [employer], [employer], [place] of her grandson.   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 This resulted in vertigo. At one point they thought that she had a brain tumor but they decided not to operate. She had an abscess in her posterior right cranium. She states that these were the days before penicillin.  Overall she's having no chest pain, no shortness of breath. She has been battling with severe allergies. No spray sometimes give her nosebleeds. She sits up sometimes at night because of the mucus production.  11/30/17 -She was in the emergency department on 10/16/17 with atrial fibrillation sensation however EKG showed regular rhythm at 90 no acute ischemia noted. She felt pale.  She went home.  No further occurrences.  She is having no chest pain, no shortness of breath.  Occasional sinus issues.  Still using Lasix as needed if weight goes up 2 pounds.  Has some lower extremity edema.  05/15/18 - having trouble  breathing. Turned AC down. New for you. No CP. Recent UTI.  CXR was done and fine. Edema in ankles noted.  Clear mucus in AM.   06/12/2018- was having some trouble with shortness of breath.  Echocardiogram showed moderate aortic regurgitation once again with normal ejection fraction.  We were to continue with Lasix.   Wt Readings from Last 3 Encounters:  06/12/18 150 lb (68 kg)  05/15/18 151 lb (68.5 kg)  11/30/17 153 lb (69.4 kg)     Past Medical History:  Diagnosis Date  . Aortic valve disorder   . Atrial fibrillation (HCC)    paroxysmal, status post successful vessel cardioversion 5/09, Amarys Sliwinski,transient left ventricular systolic dysfunction, likely secondary to cardioversion/stunning of the myocardium, no evidence significant CAD, echo 7/09 shows normal ejection fraction  . Carpal tunnel syndrome   . Chronic anticoagulation   . DJD (degenerative joint disease)   . Dysrhythmia   . GERD (gastroesophageal reflux disease)    OTC occasionally  . Goiter   . HTN (hypertension)   . Hyperlipidemia   . Hypothyroidism   . Macular degeneration   . Mitral regurgitation   . Osteoporosis    , severe, hypercalciuria, Boniva since 2005, T10 compression fracture 2009  . Other disorder of calcium metabolism   . Polymyalgia rheumatica ([place])   . Presence of permanent cardiac pacemaker   . Sinoatrial node dysfunction (HCC)   . Sinus drainage    in the morning, clear drainage    Past Surgical History:  Procedure Laterality Date  . CARPAL TUNNEL RELEASE    .  DILATION AND CURETTAGE OF UTERUS    . EYE SURGERY Bilateral    cataracts with lens   . FEMUR FRACTURE SURGERY    . PACEMAKER INSERTION     Dual chamber permanent pacer insertion for tachy brady syndrome, 01/2009  . TONSILLECTOMY AND ADENOIDECTOMY    . TOTAL HIP ARTHROPLASTY Right 12/16/2014   Procedure: RIGHT TOTAL HIP ARTHROPLASTY ANTERIOR APPROACH;  Surgeon: Hessie Dibble, MD;  Location: [facility];  Service: Orthopedics;  Laterality:  Right;  . TOTAL KNEE ARTHROPLASTY      Current Outpatient Medications  Medication Sig Dispense Refill  . acetaminophen (TYLENOL) 650 MG CR tablet Take 6,501,300 mg by mouth every 8 (eight) hours as needed for pain.    Marland Kitchen azelastine (ASTELIN) 0.1 % nasal spray Place 2 sprays into both nostrils 2 (two) times daily.  5  . CALCIUM PO Take 500 mg by mouth 2 (two) times daily.    . Cholecalciferol (VITAMIN D PO) Take 1 tablet by mouth daily.    . cyclobenzaprine (FLEXERIL) 5 MG tablet Take 1 tablet (5 mg total) by mouth 3 (three) times daily as needed for muscle spasms. 10 tablet 0  . DILT-XR 180 MG 24 hr capsule Take 180 mg by mouth daily.  10  . docusate sodium (COLACE) 100 MG capsule Take 100 mg by mouth 2 (two) times daily.     . furosemide (LASIX) 40 MG tablet Take 1 tablet (40 mg total) by mouth daily. 90 tablet 3  . levothyroxine (SYNTHROID, LEVOTHROID) 25 MCG tablet TAKE 1 TABLET BY MOUTH DAILY 30 tablet 3  . Menthol, Topical Analgesic, (BENGAY EX) Apply 1 application topically 2 (two) times daily as needed (leg pain).    . metoprolol succinate (TOPROL-XL) 100 MG 24 hr tablet TAKE 1 TABLET BY MOUTH EVERY DAY WITH OR IMMEDIATELY FOLLOWING A MEAL 90 tablet 1  . OVER THE COUNTER MEDICATION Take by mouth daily as needed (nasal drip). Tylenol Cold - Mucous Severe (dextromethorphan, acetaminophen, phenylephrine, guaifenesin) - takes early morning if she wakes up with a nasal drip (1 swig)    . potassium chloride (K-DUR,KLOR-CON) 10 MEQ tablet Take 1 tablet by mouth daily.  1  . tiZANidine (ZANAFLEX) 2 MG tablet Take 1 tablet by mouth 2 (two) times daily.  5  . warfarin (COUMADIN) 4 MG tablet USE AS DIRECTED BY COUMADIN CLINIC 40 tablet 3   No current facility-administered medications for this visit.     Allergies:   No Known Allergies  Social History:  The patient  reports that she has never smoked. She has never used smokeless tobacco. She reports that she drinks alcohol. She reports that she  does not use drugs.   ROS: All others neg.   PHYSICAL EXAM: VS:  BP (!) 124/58   Pulse 83   Ht 5\' 7"  (1.702 m)   Wt 150 lb (68 kg)   LMP  (LMP Unknown)   SpO2 98%   BMI 23.49 kg/m   GEN: Well nourished, well developed, in no acute distress uses walker HEENT: normal  Neck: no JVD, carotid bruits, or masses Cardiac: RRR, paced; no murmurs, rubs, or gallops, R>L chronic edema, especially pedal Respiratory:  clear to auscultation bilaterally, normal work of breathing GI: soft, nontender, nondistended, + BS MS: no deformity or atrophy  Skin: warm and dry, no rash Neuro:  Alert and Oriented x 3, Strength and sensation are intact Psych: euthymic mood, full affect    EKG: Previous 10/16/17 - AFIB 90  bpm paced personally viewed. 09/07/16-heart rate 79 bpm atrial fibrillation underlying, paced rhythm. 09/09/15-atrial fibrillation heart rate 95 bpm, isolated ventricular paced beat. 09/17/14- Atrial fibrillation 94, left axis deviation,no ST segment changes, no significant change from prior  ECHO 05/15/18: - Left ventricle: The cavity size was normal. Wall thickness was   normal. Systolic function was normal. The estimated ejection   fraction was in the range of 55% to 60%. Wall motion was normal;   there were no regional wall motion abnormalities. - Aortic valve: There was moderate regurgitation. - Mitral valve: Calcified annulus. Mildly thickened leaflets .   Systolic bowing without prolapse. There was mild to moderate   regurgitation directed centrally. - Left atrium: The atrium was moderately dilated. - Right ventricle: The cavity size was mildly dilated. Systolic   function was mildly reduced. - Right atrium: The atrium was mildly dilated. - Tricuspid valve: There was mild-moderate regurgitation. - Pulmonary arteries: Systolic pressure was moderately increased.   PA peak pressure: 61 mm Hg (S).  Impressions:  - Previously described anteroseptal wall motion abnormality is not    appreciated on this curtrent study.  ASSESSMENT AND PLAN:  1. Chronic dyspnea on exertion-Lasix 40 mg once a day.  She used to take this as needed.  Stable.  Continue to encourage use.  Daily weights.  She still has pedal edema.  Encourage use of TED hose. 2. Pacemaker - Dr. Lovena Le, 2011.  Device working well. Dr. Tanna Furry note reviewed.  Overall no changes. 3. Atrial fibrillation-permanent, chronic. Doing well. Well rate controlled on current medications. No changes made. CHADS-VASc - 4 (hypertension, age, female) no symptoms. Contemplated decreasing her diltiazem in the past to maybe help improve her lower extremity edema however she has had ventricular high rates on pacemaker. We will continue.  Overall stable.  Well rate controlled. 4. Chronic anticoagulation-no bleeding. Continue to monitor closely. Monitored here. Not interested in novel oral anticoagulation. Stable, hemoglobin 14.3 5. Hypertension- Doing well. Prior ACE-I cough. However her cough persisted. She does have rhinorrhea. Potentially allergies. Dr. Laurann Montana has her on Flonase. No change.  Currently well controlled. 6. Aortic regurgitation-moderate. No change clinically. No need for repeat echocardiogram at this time.  No need for surgery.  Continue to monitor. 7. Pedal edema- 10 you furosemide especially if weight increases 2 pounds, Dr. Laurann Montana.  Using support hose. Elevate legs. No change 8. Six-month follow-up  Signed, Candee Furbish, MD [facility]  06/12/2018 8:53 AM

## 2018-06-22 ENCOUNTER — Telehealth: Payer: Self-pay | Admitting: Cardiology

## 2018-06-22 NOTE — Telephone Encounter (Signed)
SPoke with Almyra Free re pt to  see  if pt may decrease Furosemide to  20 mg since swelling has improved . Will froward to Dr Marlou Porch for review cy

## 2018-06-22 NOTE — Telephone Encounter (Signed)
New message   Pt c/o medication issue:  1. Name of Medication: furosemide (LASIX) 40 MG tablet  2. How are you currently taking this medication (dosage and times per day)? 40 mg once daily in morning  3. Are you having a reaction (difficulty breathing--STAT)? No   4. What is your medication issue?Per Almyra Free, the patient wants this medicine can decreased because she is having issues with urinating frequently through the day.  The swelling in the patients feet has gone down tremendously.  Please contact the patient or Almyra Free.

## 2018-06-25 ENCOUNTER — Ambulatory Visit (INDEPENDENT_AMBULATORY_CARE_PROVIDER_SITE_OTHER): Payer: Medicare Other | Admitting: Pharmacist

## 2018-06-25 DIAGNOSIS — Z5181 Encounter for therapeutic drug level monitoring: Secondary | ICD-10-CM

## 2018-06-25 DIAGNOSIS — I4891 Unspecified atrial fibrillation: Secondary | ICD-10-CM

## 2018-06-25 LAB — POCT INR: INR: 2.3 (ref 2.0–3.0)

## 2018-06-25 NOTE — Telephone Encounter (Signed)
Follow up    Almyra Free is calling to check on the status of the previous note about the  furosemide (LASIX) 40 MG tablet. Please call to discuss.

## 2018-06-25 NOTE — Patient Instructions (Signed)
Description   Continue taking 1 tablet every day except 1.5 tablets only on Mondays and Fridays. Recheck INR in 6 weeks - pt will only come back in 6 weeks.  Call with any questions or concerns [phone]

## 2018-06-26 MED ORDER — FUROSEMIDE 20 MG PO TABS: 20.0000 mg | ORAL_TABLET | Freq: Every day | ORAL | Status: DC

## 2018-06-26 NOTE — Telephone Encounter (Signed)
Almyra Free aware per Dr Marlou Porch OK to decrease Furosemide to 20 mg a day.  Medication list updated.

## 2018-06-26 NOTE — Telephone Encounter (Signed)
OK to decrease to lasix 20mg .

## 2018-06-28 DIAGNOSIS — I10 Essential (primary) hypertension: Secondary | ICD-10-CM | POA: Diagnosis not present

## 2018-06-28 DIAGNOSIS — E782 Mixed hyperlipidemia: Secondary | ICD-10-CM | POA: Diagnosis not present

## 2018-06-28 DIAGNOSIS — E039 Hypothyroidism, unspecified: Secondary | ICD-10-CM | POA: Diagnosis not present

## 2018-06-28 DIAGNOSIS — M159 Polyosteoarthritis, unspecified: Secondary | ICD-10-CM | POA: Diagnosis not present

## 2018-06-28 DIAGNOSIS — M810 Age-related osteoporosis without current pathological fracture: Secondary | ICD-10-CM | POA: Diagnosis not present

## 2018-06-28 DIAGNOSIS — M169 Osteoarthritis of hip, unspecified: Secondary | ICD-10-CM | POA: Diagnosis not present

## 2018-06-28 DIAGNOSIS — I480 Paroxysmal atrial fibrillation: Secondary | ICD-10-CM | POA: Diagnosis not present

## 2018-06-29 ENCOUNTER — Other Ambulatory Visit: Payer: Self-pay | Admitting: Cardiology

## 2018-07-09 DIAGNOSIS — Z23 Encounter for immunization: Secondary | ICD-10-CM | POA: Diagnosis not present

## 2018-08-06 DIAGNOSIS — R300 Dysuria: Secondary | ICD-10-CM | POA: Diagnosis not present

## 2018-08-07 ENCOUNTER — Ambulatory Visit (INDEPENDENT_AMBULATORY_CARE_PROVIDER_SITE_OTHER): Payer: Medicare Other | Admitting: *Deleted

## 2018-08-07 ENCOUNTER — Telehealth: Payer: Self-pay | Admitting: Cardiology

## 2018-08-07 DIAGNOSIS — I495 Sick sinus syndrome: Secondary | ICD-10-CM

## 2018-08-07 NOTE — Telephone Encounter (Signed)
New message   Patient's caregiver wants you to know that patient has urinary tract infection and the patient is on ciprofloxacin 500 mg. The patient will start taking the medication today. Please call to discuss.

## 2018-08-07 NOTE — Telephone Encounter (Signed)
Attempted to call back but received a message - "system armed, enter access code"  Was unable to leave message.  Will attempt to call back.

## 2018-08-07 NOTE — Telephone Encounter (Signed)
Called # listed and pt answered the phone.  She states she doesn't need to discuss anything, that she is taking the medication twice a day for 3 days as ordered.  She has f/u appt scheduled in the [facility] soon.  She will c/b if any questions or concerns.

## 2018-08-08 NOTE — Progress Notes (Signed)
Remote pacemaker transmission.   

## 2018-08-10 ENCOUNTER — Ambulatory Visit (INDEPENDENT_AMBULATORY_CARE_PROVIDER_SITE_OTHER): Payer: Medicare Other | Admitting: Pharmacist

## 2018-08-10 DIAGNOSIS — Z5181 Encounter for therapeutic drug level monitoring: Secondary | ICD-10-CM | POA: Diagnosis not present

## 2018-08-10 DIAGNOSIS — I4891 Unspecified atrial fibrillation: Secondary | ICD-10-CM | POA: Diagnosis not present

## 2018-08-10 LAB — POCT INR: INR: 2.5 (ref 2.0–3.0)

## 2018-08-10 NOTE — Patient Instructions (Signed)
Description   Continue taking 1 tablet every day except 1.5 tablets only on Mondays and Fridays. Recheck INR in 6 weeks.  Call with any questions or concerns [phone]

## 2018-08-16 ENCOUNTER — Encounter: Payer: Self-pay | Admitting: Cardiology

## 2018-08-20 DIAGNOSIS — M1712 Unilateral primary osteoarthritis, left knee: Secondary | ICD-10-CM | POA: Diagnosis not present

## 2018-08-20 DIAGNOSIS — I872 Venous insufficiency (chronic) (peripheral): Secondary | ICD-10-CM | POA: Diagnosis not present

## 2018-08-20 DIAGNOSIS — M7989 Other specified soft tissue disorders: Secondary | ICD-10-CM | POA: Diagnosis not present

## 2018-08-22 DIAGNOSIS — H43813 Vitreous degeneration, bilateral: Secondary | ICD-10-CM | POA: Diagnosis not present

## 2018-08-22 DIAGNOSIS — H353223 Exudative age-related macular degeneration, left eye, with inactive scar: Secondary | ICD-10-CM | POA: Diagnosis not present

## 2018-08-22 DIAGNOSIS — H35421 Microcystoid degeneration of retina, right eye: Secondary | ICD-10-CM | POA: Diagnosis not present

## 2018-08-22 DIAGNOSIS — H353212 Exudative age-related macular degeneration, right eye, with inactive choroidal neovascularization: Secondary | ICD-10-CM | POA: Diagnosis not present

## 2018-08-30 LAB — CUP PACEART REMOTE DEVICE CHECK
Battery Impedance: 4288 Ohm
Battery Remaining Longevity: 14 mo
Battery Voltage: 2.7 V
Brady Statistic RV Percent Paced: 32 %
Date Time Interrogation Session: 20191008164027
Implantable Lead Implant Date: 20100409
Implantable Lead Implant Date: 20100409
Implantable Lead Location: 753859
Implantable Lead Location: 753860
Implantable Lead Model: 5076
Implantable Lead Model: 5092
Implantable Pulse Generator Implant Date: 20100409
Lead Channel Impedance Value: 67 Ohm
Lead Channel Impedance Value: 717 Ohm
Lead Channel Pacing Threshold Amplitude: 0.875 V
Lead Channel Pacing Threshold Pulse Width: 0.4 ms
Lead Channel Setting Pacing Amplitude: 2 V
Lead Channel Setting Pacing Pulse Width: 0.4 ms
Lead Channel Setting Sensing Sensitivity: 2 mV

## 2018-09-08 ENCOUNTER — Other Ambulatory Visit: Payer: Self-pay

## 2018-09-08 ENCOUNTER — Encounter (HOSPITAL_COMMUNITY): Payer: Self-pay | Admitting: Emergency Medicine

## 2018-09-08 ENCOUNTER — Emergency Department (HOSPITAL_COMMUNITY): Payer: Medicare Other

## 2018-09-08 ENCOUNTER — Inpatient Hospital Stay (HOSPITAL_COMMUNITY)
Admission: EM | Admit: 2018-09-08 | Discharge: 2018-09-12 | DRG: 552 | Disposition: A | Discharge status: Skilled Nursing Facility | Payer: Medicare Other | Attending: Internal Medicine | Admitting: Internal Medicine

## 2018-09-08 DIAGNOSIS — I10 Essential (primary) hypertension: Secondary | ICD-10-CM | POA: Diagnosis present

## 2018-09-08 DIAGNOSIS — S32000A Wedge compression fracture of unspecified lumbar vertebra, initial encounter for closed fracture: Secondary | ICD-10-CM

## 2018-09-08 DIAGNOSIS — M6281 Muscle weakness (generalized): Secondary | ICD-10-CM | POA: Diagnosis not present

## 2018-09-08 DIAGNOSIS — I482 Chronic atrial fibrillation, unspecified: Secondary | ICD-10-CM | POA: Diagnosis present

## 2018-09-08 DIAGNOSIS — R2681 Unsteadiness on feet: Secondary | ICD-10-CM | POA: Diagnosis not present

## 2018-09-08 DIAGNOSIS — M25552 Pain in left hip: Secondary | ICD-10-CM | POA: Diagnosis not present

## 2018-09-08 DIAGNOSIS — Z7901 Long term (current) use of anticoagulants: Secondary | ICD-10-CM | POA: Diagnosis not present

## 2018-09-08 DIAGNOSIS — J188 Other pneumonia, unspecified organism: Secondary | ICD-10-CM | POA: Diagnosis not present

## 2018-09-08 DIAGNOSIS — W19XXXA Unspecified fall, initial encounter: Secondary | ICD-10-CM | POA: Diagnosis not present

## 2018-09-08 DIAGNOSIS — E785 Hyperlipidemia, unspecified: Secondary | ICD-10-CM | POA: Diagnosis present

## 2018-09-08 DIAGNOSIS — Z950 Presence of cardiac pacemaker: Secondary | ICD-10-CM

## 2018-09-08 DIAGNOSIS — M1611 Unilateral primary osteoarthritis, right hip: Secondary | ICD-10-CM | POA: Diagnosis not present

## 2018-09-08 DIAGNOSIS — G5600 Carpal tunnel syndrome, unspecified upper limb: Secondary | ICD-10-CM | POA: Diagnosis not present

## 2018-09-08 DIAGNOSIS — Z96651 Presence of right artificial knee joint: Secondary | ICD-10-CM | POA: Diagnosis present

## 2018-09-08 DIAGNOSIS — I358 Other nonrheumatic aortic valve disorders: Secondary | ICD-10-CM | POA: Diagnosis not present

## 2018-09-08 DIAGNOSIS — S3992XA Unspecified injury of lower back, initial encounter: Secondary | ICD-10-CM | POA: Diagnosis not present

## 2018-09-08 DIAGNOSIS — Z96641 Presence of right artificial hip joint: Secondary | ICD-10-CM | POA: Diagnosis present

## 2018-09-08 DIAGNOSIS — I080 Rheumatic disorders of both mitral and aortic valves: Secondary | ICD-10-CM | POA: Diagnosis present

## 2018-09-08 DIAGNOSIS — W1839XA Other fall on same level, initial encounter: Secondary | ICD-10-CM | POA: Diagnosis present

## 2018-09-08 DIAGNOSIS — S0990XA Unspecified injury of head, initial encounter: Secondary | ICD-10-CM | POA: Diagnosis not present

## 2018-09-08 DIAGNOSIS — M818 Other osteoporosis without current pathological fracture: Secondary | ICD-10-CM | POA: Diagnosis not present

## 2018-09-08 DIAGNOSIS — W19XXXD Unspecified fall, subsequent encounter: Secondary | ICD-10-CM | POA: Diagnosis not present

## 2018-09-08 DIAGNOSIS — H3530 Unspecified macular degeneration: Secondary | ICD-10-CM | POA: Diagnosis present

## 2018-09-08 DIAGNOSIS — Z79890 Hormone replacement therapy (postmenopausal): Secondary | ICD-10-CM

## 2018-09-08 DIAGNOSIS — M353 Polymyalgia rheumatica: Secondary | ICD-10-CM | POA: Diagnosis present

## 2018-09-08 DIAGNOSIS — R791 Abnormal coagulation profile: Secondary | ICD-10-CM | POA: Diagnosis present

## 2018-09-08 DIAGNOSIS — Z5181 Encounter for therapeutic drug level monitoring: Secondary | ICD-10-CM | POA: Diagnosis not present

## 2018-09-08 DIAGNOSIS — E440 Moderate protein-calorie malnutrition: Secondary | ICD-10-CM | POA: Diagnosis not present

## 2018-09-08 DIAGNOSIS — Y92002 Bathroom of unspecified non-institutional (private) residence single-family (private) house as the place of occurrence of the external cause: Secondary | ICD-10-CM

## 2018-09-08 DIAGNOSIS — R0902 Hypoxemia: Secondary | ICD-10-CM | POA: Diagnosis not present

## 2018-09-08 DIAGNOSIS — R296 Repeated falls: Secondary | ICD-10-CM | POA: Diagnosis not present

## 2018-09-08 DIAGNOSIS — E079 Disorder of thyroid, unspecified: Secondary | ICD-10-CM | POA: Diagnosis not present

## 2018-09-08 DIAGNOSIS — Z7401 Bed confinement status: Secondary | ICD-10-CM | POA: Diagnosis not present

## 2018-09-08 DIAGNOSIS — R52 Pain, unspecified: Secondary | ICD-10-CM | POA: Diagnosis not present

## 2018-09-08 DIAGNOSIS — Z66 Do not resuscitate: Secondary | ICD-10-CM | POA: Diagnosis present

## 2018-09-08 DIAGNOSIS — M25572 Pain in left ankle and joints of left foot: Secondary | ICD-10-CM | POA: Diagnosis not present

## 2018-09-08 DIAGNOSIS — Z8249 Family history of ischemic heart disease and other diseases of the circulatory system: Secondary | ICD-10-CM | POA: Diagnosis not present

## 2018-09-08 DIAGNOSIS — M545 Low back pain: Principal | ICD-10-CM | POA: Diagnosis present

## 2018-09-08 DIAGNOSIS — K219 Gastro-esophageal reflux disease without esophagitis: Secondary | ICD-10-CM | POA: Diagnosis present

## 2018-09-08 DIAGNOSIS — E039 Hypothyroidism, unspecified: Secondary | ICD-10-CM | POA: Diagnosis present

## 2018-09-08 DIAGNOSIS — R278 Other lack of coordination: Secondary | ICD-10-CM | POA: Diagnosis not present

## 2018-09-08 DIAGNOSIS — M2550 Pain in unspecified joint: Secondary | ICD-10-CM | POA: Diagnosis not present

## 2018-09-08 DIAGNOSIS — I495 Sick sinus syndrome: Secondary | ICD-10-CM | POA: Diagnosis present

## 2018-09-08 DIAGNOSIS — M25551 Pain in right hip: Secondary | ICD-10-CM | POA: Diagnosis not present

## 2018-09-08 DIAGNOSIS — S32000D Wedge compression fracture of unspecified lumbar vertebra, subsequent encounter for fracture with routine healing: Secondary | ICD-10-CM | POA: Diagnosis not present

## 2018-09-08 DIAGNOSIS — Z79899 Other long term (current) drug therapy: Secondary | ICD-10-CM

## 2018-09-08 DIAGNOSIS — I4891 Unspecified atrial fibrillation: Secondary | ICD-10-CM | POA: Diagnosis not present

## 2018-09-08 DIAGNOSIS — R2689 Other abnormalities of gait and mobility: Secondary | ICD-10-CM | POA: Diagnosis not present

## 2018-09-08 DIAGNOSIS — S32010A Wedge compression fracture of first lumbar vertebra, initial encounter for closed fracture: Secondary | ICD-10-CM | POA: Diagnosis not present

## 2018-09-08 DIAGNOSIS — R531 Weakness: Secondary | ICD-10-CM | POA: Diagnosis not present

## 2018-09-08 DIAGNOSIS — M810 Age-related osteoporosis without current pathological fracture: Secondary | ICD-10-CM | POA: Diagnosis present

## 2018-09-08 DIAGNOSIS — R110 Nausea: Secondary | ICD-10-CM | POA: Diagnosis not present

## 2018-09-08 HISTORY — DX: Rheumatoid arthritis, unspecified: M06.9

## 2018-09-08 HISTORY — DX: Unspecified malignant neoplasm of skin, unspecified: C44.90

## 2018-09-08 HISTORY — DX: Anxiety disorder, unspecified: F41.9

## 2018-09-08 HISTORY — DX: Exudative age-related macular degeneration, right eye, stage unspecified: H35.3210

## 2018-09-08 HISTORY — DX: Nonexudative age-related macular degeneration, left eye, stage unspecified: H35.3120

## 2018-09-08 HISTORY — DX: Personal history of other medical treatment: Z92.89

## 2018-09-08 HISTORY — DX: Cardiac murmur, unspecified: R01.1

## 2018-09-08 LAB — CBC WITH DIFFERENTIAL/PLATELET
Abs Immature Granulocytes: 0.14 10*3/uL — ABNORMAL HIGH (ref 0.00–0.07)
Basophils Absolute: 0.1 10*3/uL (ref 0.0–0.1)
Basophils Relative: 1 %
Eosinophils Absolute: 0.1 10*3/uL (ref 0.0–0.5)
Eosinophils Relative: 1 %
HCT: 45.3 % (ref 36.0–46.0)
Hemoglobin: 14 g/dL (ref 12.0–15.0)
Immature Granulocytes: 2 %
Lymphocytes Relative: 9 %
Lymphs Abs: 0.8 10*3/uL (ref 0.7–4.0)
MCH: 28.8 pg (ref 26.0–34.0)
MCHC: 30.9 g/dL (ref 30.0–36.0)
MCV: 93.2 fL (ref 80.0–100.0)
Monocytes Absolute: 0.7 10*3/uL (ref 0.1–1.0)
Monocytes Relative: 7 %
Neutro Abs: 7.8 10*3/uL — ABNORMAL HIGH (ref 1.7–7.7)
Neutrophils Relative %: 80 %
Platelets: 388 10*3/uL (ref 150–400)
RBC: 4.86 MIL/uL (ref 3.87–5.11)
RDW: 14.9 % (ref 11.5–15.5)
WBC: 9.6 10*3/uL (ref 4.0–10.5)
nRBC: 0 % (ref 0.0–0.2)

## 2018-09-08 LAB — I-STAT CHEM 8, ED
BUN: 18 mg/dL (ref 8–23)
Calcium, Ion: 1.23 mmol/L (ref 1.15–1.40)
Chloride: 98 mmol/L (ref 98–111)
Creatinine, Ser: 0.8 mg/dL (ref 0.44–1.00)
Glucose, Bld: 107 mg/dL — ABNORMAL HIGH (ref 70–99)
HCT: 46 % (ref 36.0–46.0)
Hemoglobin: 15.6 g/dL — ABNORMAL HIGH (ref 12.0–15.0)
Potassium: 4.4 mmol/L (ref 3.5–5.1)
Sodium: 138 mmol/L (ref 135–145)
TCO2: 35 mmol/L — ABNORMAL HIGH (ref 22–32)

## 2018-09-08 LAB — BASIC METABOLIC PANEL
Anion gap: 7 (ref 5–15)
BUN: 14 mg/dL (ref 8–23)
CO2: 30 mmol/L (ref 22–32)
Calcium: 9.8 mg/dL (ref 8.9–10.3)
Chloride: 100 mmol/L (ref 98–111)
Creatinine, Ser: 0.77 mg/dL (ref 0.44–1.00)
GFR calc Af Amer: >60 mL/min (ref >60)
GFR calc non Af Amer: >60 mL/min (ref >60)
Glucose, Bld: 108 mg/dL — ABNORMAL HIGH (ref 70–99)
Potassium: 4.4 mmol/L (ref 3.5–5.1)
Sodium: 137 mmol/L (ref 135–145)

## 2018-09-08 LAB — PROTIME-INR
INR: 2.01
Prothrombin Time: 22.5 seconds — ABNORMAL HIGH (ref 11.4–15.2)

## 2018-09-08 MED ORDER — MORPHINE SULFATE (PF) 4 MG/ML IV SOLN
4.0000 mg | Freq: Once | INTRAVENOUS | Status: AC
  Administered 2018-09-08: 4 mg via INTRAVENOUS
  Filled 2018-09-08: qty 1

## 2018-09-08 MED ORDER — OXYCODONE-ACETAMINOPHEN 5-325 MG PO TABS
1.0000 | ORAL_TABLET | Freq: Once | ORAL | Status: AC
  Administered 2018-09-08: 1 via ORAL
  Filled 2018-09-08: qty 1

## 2018-09-08 MED ORDER — WARFARIN SODIUM 4 MG PO TABS
4.0000 mg | ORAL_TABLET | Freq: Once | ORAL | Status: DC
  Filled 2018-09-08: qty 1

## 2018-09-08 MED ORDER — DILTIAZEM HCL ER COATED BEADS 180 MG PO CP24
180.0000 mg | ORAL_CAPSULE | Freq: Every day | ORAL | Status: DC
  Administered 2018-09-08 – 2018-09-11 (×4): 180 mg via ORAL
  Filled 2018-09-08 (×7): qty 1

## 2018-09-08 MED ORDER — ACETAMINOPHEN 325 MG PO TABS: 650.0000 mg | ORAL_TABLET | Freq: Three times a day (TID) | ORAL | Status: DC | PRN

## 2018-09-08 MED ORDER — SODIUM CHLORIDE 0.9 % IV SOLN: 250.0000 mL | INTRAVENOUS | Status: DC | PRN

## 2018-09-08 MED ORDER — HYDROCODONE-ACETAMINOPHEN 5-325 MG PO TABS
1.0000 | ORAL_TABLET | ORAL | Status: DC | PRN
  Administered 2018-09-09: 1 via ORAL
  Administered 2018-09-09 – 2018-09-10 (×3): 2 via ORAL
  Administered 2018-09-10 – 2018-09-12 (×3): 1 via ORAL
  Filled 2018-09-08 (×2): qty 1
  Filled 2018-09-08 (×3): qty 2
  Filled 2018-09-08: qty 1
  Filled 2018-09-08: qty 2
  Filled 2018-09-08: qty 1

## 2018-09-08 MED ORDER — FUROSEMIDE 20 MG PO TABS
20.0000 mg | ORAL_TABLET | Freq: Every day | ORAL | Status: DC
  Administered 2018-09-08 – 2018-09-10 (×3): 20 mg via ORAL
  Filled 2018-09-08 (×3): qty 1

## 2018-09-08 MED ORDER — ACETAMINOPHEN ER 650 MG PO TBCR: 650.0000 mg | EXTENDED_RELEASE_TABLET | Freq: Three times a day (TID) | ORAL | Status: DC | PRN

## 2018-09-08 MED ORDER — OXYCODONE-ACETAMINOPHEN 5-325 MG PO TABS: 1.0000 | ORAL_TABLET | Freq: Four times a day (QID) | ORAL | 0 refills | Status: DC | PRN

## 2018-09-08 MED ORDER — WARFARIN - PHARMACIST DOSING INPATIENT
Freq: Every day | Status: DC
  Administered 2018-09-09: 18:00:00

## 2018-09-08 MED ORDER — DOCUSATE SODIUM 100 MG PO CAPS
100.0000 mg | ORAL_CAPSULE | Freq: Two times a day (BID) | ORAL | Status: DC
  Administered 2018-09-09 – 2018-09-12 (×7): 100 mg via ORAL
  Filled 2018-09-08 (×7): qty 1

## 2018-09-08 MED ORDER — SODIUM CHLORIDE 0.9% FLUSH: 3.0000 mL | INTRAVENOUS | Status: DC | PRN

## 2018-09-08 MED ORDER — LEVOTHYROXINE SODIUM 25 MCG PO TABS
25.0000 ug | ORAL_TABLET | Freq: Every day | ORAL | Status: DC
  Administered 2018-09-09 – 2018-09-12 (×4): 25 ug via ORAL
  Filled 2018-09-08 (×4): qty 1

## 2018-09-08 MED ORDER — METOPROLOL SUCCINATE ER 100 MG PO TB24
100.0000 mg | ORAL_TABLET | Freq: Every day | ORAL | Status: DC
  Administered 2018-09-08 – 2018-09-12 (×5): 100 mg via ORAL
  Filled 2018-09-08 (×5): qty 1

## 2018-09-08 MED ORDER — CYCLOBENZAPRINE HCL 10 MG PO TABS
5.0000 mg | ORAL_TABLET | Freq: Three times a day (TID) | ORAL | Status: DC | PRN
  Administered 2018-09-09 – 2018-09-10 (×3): 5 mg via ORAL
  Filled 2018-09-08 (×3): qty 1

## 2018-09-08 MED ORDER — TIZANIDINE HCL 4 MG PO TABS: 2.0000 mg | ORAL_TABLET | Freq: Two times a day (BID) | ORAL | Status: DC

## 2018-09-08 MED ORDER — SODIUM CHLORIDE 0.9% FLUSH
3.0000 mL | Freq: Two times a day (BID) | INTRAVENOUS | Status: DC
  Administered 2018-09-09 – 2018-09-12 (×7): 3 mL via INTRAVENOUS

## 2018-09-08 MED ORDER — MORPHINE SULFATE (PF) 2 MG/ML IV SOLN
2.0000 mg | INTRAVENOUS | Status: DC | PRN
  Administered 2018-09-09 – 2018-09-11 (×3): 2 mg via INTRAVENOUS
  Filled 2018-09-08 (×3): qty 1

## 2018-09-08 NOTE — ED Notes (Signed)
Patient transported to CT 

## 2018-09-08 NOTE — Progress Notes (Signed)
ANTICOAGULATION CONSULT NOTE - Initial Consult  Pharmacy Consult for warfarin Indication: atrial fibrillation  No Known Allergies  Patient Measurements: Height: 5\' 7"  (170.2 cm) Weight: 146 lb (66.2 kg) IBW/kg (Calculated) : 61.6  Vital Signs: Temp: 97.6 F (36.4 C) (11/09 1149) Temp Source: Oral (11/09 1149) BP: 129/80 (11/09 1629) Pulse Rate: 51 (11/09 1629)  Labs: Recent Labs    09/08/18 1151 09/08/18 1218  HGB 14.0 15.6*  HCT 45.3 46.0  PLT 388  --   LABPROT 22.5*  --   INR 2.01  --   CREATININE 0.77 0.80    Estimated Creatinine Clearance: 43.6 mL/min (by C-G formula based on SCr of 0.8 mg/dL).   Medical History: Past Medical History:  Diagnosis Date  . Aortic valve disorder   . Atrial fibrillation (HCC)    paroxysmal, status post successful vessel cardioversion 5/09, skains,transient left ventricular systolic dysfunction, likely secondary to cardioversion/stunning of the myocardium, no evidence significant CAD, echo 7/09 shows normal ejection fraction  . Carpal tunnel syndrome   . Chronic anticoagulation   . DJD (degenerative joint disease)   . Dysrhythmia   . GERD (gastroesophageal reflux disease)    OTC occasionally  . Goiter   . HTN (hypertension)   . Hyperlipidemia   . Hypothyroidism   . Macular degeneration   . Mitral regurgitation   . Osteoporosis    , severe, hypercalciuria, Boniva since 2005, T10 compression fracture 2009  . Other disorder of calcium metabolism   . Polymyalgia rheumatica ([place])   . Presence of permanent cardiac pacemaker   . Sinoatrial node dysfunction (HCC)   . Sinus drainage    in the morning, clear drainage   Assessment: 89 yof presented to the hospital s/p mechanical fall. She is on warfarin for history of afib. INR is therapeutic. No bleeding note including negative CT head.   Goal of Therapy:  INR 2-3 Monitor platelets by anticoagulation protocol: Yes   Plan:  Warfarin 4mg  PO x 1 tonight Daily INR  Laquilla Dault,  Rande Lawman 09/08/2018,5:38 PM

## 2018-09-08 NOTE — H&P (Signed)
[facility]                                                                                    Patient Demographics  Patricia Holt, is a 84 y.o. female  CSN: [id number]  MRN: [id number]  DOB - 02/08/1924  Admit Date - 09/08/2018  Outpatient Primary MD for the patient is Lavone Orn, MD   With History of -  Past Medical History:  Diagnosis Date  . Aortic valve disorder   . Atrial fibrillation (HCC)    paroxysmal, status post successful vessel cardioversion 5/09, skains,transient left ventricular systolic dysfunction, likely secondary to cardioversion/stunning of the myocardium, no evidence significant CAD, echo 7/09 shows normal ejection fraction  . Carpal tunnel syndrome   . Chronic anticoagulation   . DJD (degenerative joint disease)   . Dysrhythmia   . GERD (gastroesophageal reflux disease)    OTC occasionally  . Goiter   . HTN (hypertension)   . Hyperlipidemia   . Hypothyroidism   . Macular degeneration   . Mitral regurgitation   . Osteoporosis    , severe, hypercalciuria, [place] since 2005, T10 compression fracture 2009  . Other disorder of calcium metabolism   . Polymyalgia rheumatica ([place])   . Presence of permanent cardiac pacemaker   . Sinoatrial node dysfunction (HCC)   . Sinus drainage    in the morning, clear drainage      Past Surgical History:  Procedure Laterality Date  . CARPAL TUNNEL RELEASE    . DILATION AND CURETTAGE OF UTERUS    . EYE SURGERY Bilateral    cataracts with lens   . FEMUR FRACTURE SURGERY    . PACEMAKER INSERTION     Dual chamber permanent pacer insertion for tachy brady syndrome, 01/2009  . TONSILLECTOMY AND ADENOIDECTOMY    . TOTAL HIP ARTHROPLASTY Right 12/16/2014   Procedure: RIGHT TOTAL HIP ARTHROPLASTY ANTERIOR APPROACH;  Surgeon: Hessie Dibble, MD;  Location: [facility];  Service: Orthopedics;  Laterality: Right;  . TOTAL KNEE ARTHROPLASTY      in for   Chief Complaint  Patient presents with  . Fall      HPI  Patricia Holt  is a 84 y.o. female, with past medical history significant for A. fib hypertension and hyperlipidemia who was in her usual state of health until she fell today in the bathroom while trying to hold on to the sink.  She fell on her buttocks but starting developing lower back pain.  Work-up in the emergency room was not significant for any acute abnormality however the patient continued to be in pain and could not ambulate.  Patient denied any preceding chest pains, palpitations, nausea vomiting or diarrhea.      Review of Systems    In addition to the HPI above,  No Fever-chills, No Headache, No changes with Vision or hearing, No problems swallowing food or Liquids, No Chest pain, Cough or Shortness of Breath, No Abdominal pain, No Nausea or Vommitting, Bowel movements are regular, No Blood in stool or Urine, No dysuria, No new skin rashes or bruises,  No new weakness, tingling, numbness in any extremity, No recent weight gain  or loss, No polyuria, polydypsia or polyphagia, No significant Mental Stressors.  A full 10 point Review of Systems was done, except as stated above, all other Review of Systems were negative.   Social History Social History   Tobacco Use  . Smoking status: Never Smoker  . Smokeless tobacco: Never Used  Substance Use Topics  . Alcohol use: Yes    Comment: wine and beer occasionally     Family History Family History  Problem Relation Age of Onset  . Hypertension Mother   . Hypertension Father   . Cancer Sister   . Diabetes Sister      Prior to Admission medications   Medication Sig Start Date End Date Taking? Authorizing Provider  acetaminophen (TYLENOL) 650 MG CR tablet Take 6,501,300 mg by mouth every 8 (eight) hours as needed for pain.    [provider]  azelastine (ASTELIN) 0.1 % nasal spray Place 2 sprays into both nostrils 2 (two) times daily. 04/26/18   [provider]  CALCIUM PO Take 500 mg by  mouth 2 (two) times daily.    [provider]  Cholecalciferol (VITAMIN D PO) Take 1 tablet by mouth daily.    [provider]  cyclobenzaprine (FLEXERIL) 5 MG tablet Take 1 tablet (5 mg total) by mouth 3 (three) times daily as needed for muscle spasms. 09/24/17   Elnora Morrison, MD  DILT-XR 180 MG 24 hr capsule Take 180 mg by mouth daily. 03/02/17   [provider]  docusate sodium (COLACE) 100 MG capsule Take 100 mg by mouth 2 (two) times daily.     [provider]  furosemide (LASIX) 20 MG tablet Take 1 tablet (20 mg total) by mouth daily. 06/26/18   Jerline Pain, MD  levothyroxine (SYNTHROID, LEVOTHROID) 25 MCG tablet TAKE 1 TABLET BY MOUTH DAILY 02/19/15   Lauree Chandler, NP  Menthol, Topical Analgesic, (BENGAY EX) Apply 1 application topically 2 (two) times daily as needed (leg pain).    [provider]  metoprolol succinate (TOPROL-XL) 100 MG 24 hr tablet TAKE 1 TABLET BY MOUTH EVERY DAY WITH OR IMMEDIATELY FOLLOWING A MEAL 09/15/17   Jerline Pain, MD  OVER THE COUNTER MEDICATION Take by mouth daily as needed (nasal drip). Tylenol Cold - Mucous Severe (dextromethorphan, acetaminophen, phenylephrine, guaifenesin) - takes early morning if she wakes up with a nasal drip (1 swig)    [provider]  oxyCODONE-acetaminophen (PERCOCET/ROXICET) 5-325 MG tablet Take 1 tablet by mouth every 6 (six) hours as needed for severe pain. 09/08/18   Hayden Rasmussen, MD  potassium chloride (K-DUR,KLOR-CON) 10 MEQ tablet Take 1 tablet by mouth daily. 04/17/18   [provider]  tiZANidine (ZANAFLEX) 2 MG tablet Take 1 tablet by mouth 2 (two) times daily. 04/26/18   [provider]  warfarin (COUMADIN) 4 MG tablet USE AS DIRECTED BY COUMADIN CLINIC 06/29/18   Jerline Pain, MD    No Known Allergies  Physical Exam  Vitals  Blood pressure 129/80, pulse (!) 51, temperature 97.6 F (36.4 C), temperature source Oral, resp. rate 16,  height 5\' 7"  (1.702 m), weight 66.2 kg, SpO2 93 %.   1. General elderly female, well-developed, well-nourished, extremely pleasant  2. Normal affect and insight, Not Suicidal or Homicidal, Awake Alert, Oriented X 3.  3. No F.N deficits, grossly, patient moving all extremities.  4. Ears and Eyes appear Normal, Conjunctivae clear, PERRLA. Moist Oral Mucosa.  5. Supple Neck, No JVD, No cervical  lymphadenopathy appriciated, No Carotid Bruits.  6. Symmetrical Chest wall movement, Good air movement bilaterally, CTAB.  7.  Irregularly irregular, systolic murmur noted.  8. Positive Bowel Sounds, Abdomen Soft, Non tender, No organomegaly appriciated,No rebound -guarding or rigidity.  9.  No Cyanosis, Normal Skin Turgor, No Skin Rash or Bruise.  10. Good muscle tone,  joints appear normal , no effusions, Normal ROM.    Data Review  CBC Recent Labs  Lab 09/08/18 1151 09/08/18 1218  WBC 9.6  --   HGB 14.0 15.6*  HCT 45.3 46.0  PLT 388  --   MCV 93.2  --   MCH 28.8  --   MCHC 30.9  --   RDW 14.9  --   LYMPHSABS 0.8  --   MONOABS 0.7  --   EOSABS 0.1  --   BASOSABS 0.1  --    ------------------------------------------------------------------------------------------------------------------  Chemistries  Recent Labs  Lab 09/08/18 1151 09/08/18 1218  NA 137 138  K 4.4 4.4  CL 100 98  CO2 30  --   GLUCOSE 108* 107*  BUN 14 18  CREATININE 0.77 0.80  CALCIUM 9.8  --    ------------------------------------------------------------------------------------------------------------------ estimated creatinine clearance is 43.6 mL/min (by C-G formula based on SCr of 0.8 mg/dL). ------------------------------------------------------------------------------------------------------------------ No results for input(s): TSH, T4TOTAL, T3FREE, THYROIDAB in the last 72 hours.  Invalid input(s): FREET3   Coagulation profile Recent Labs  Lab 09/08/18 1151  INR 2.01    ------------------------------------------------------------------------------------------------------------------- No results for input(s): DDIMER in the last 72 hours. -------------------------------------------------------------------------------------------------------------------  Cardiac Enzymes No results for input(s): CKMB, TROPONINI, MYOGLOBIN in the last 168 hours.  Invalid input(s): CK ------------------------------------------------------------------------------------------------------------------ Invalid input(s): POCBNP   ---------------------------------------------------------------------------------------------------------------  Urinalysis    Component Value Date/Time   COLORURINE YELLOW 12/03/2014 1408   APPEARANCEUR CLEAR 12/03/2014 1408   LABSPEC 1.010 12/03/2014 1408   PHURINE 7.5 12/03/2014 1408   GLUCOSEU NEGATIVE 12/03/2014 1408   HGBUR NEGATIVE 12/03/2014 1408   BILIRUBINUR NEGATIVE 12/03/2014 1408   KETONESUR NEGATIVE 12/03/2014 1408   PROTEINUR NEGATIVE 12/03/2014 1408   UROBILINOGEN 1.0 12/03/2014 1408   NITRITE NEGATIVE 12/03/2014 1408   LEUKOCYTESUR NEGATIVE 12/03/2014 1408    ----------------------------------------------------------------------------------------------------------------   Imaging results:   Dg Lumbar Spine Complete  Result Date: 09/08/2018 CLINICAL DATA:  Pt reports that she was bathing this am when left knee gave out and caused pt to fall near the bathroom sink. C/o pain in lower back as well as both hips and left knee. Hx of Total Hip on right side and left femur sx per pt. EXAM: LUMBAR SPINE - COMPLETE 4+ VIEW COMPARISON:  Lumbar MRI, 07/13/2008 FINDINGS: Upper lumbar spine is not well visualized radiographically due to the extensive bone demineralization. Apparent compression fractures from the lower thoracic spine through L2. Grade 1 anterolisthesis of L4 on L5. Mild dextroscoliosis, apex at L3. Loss of disc height  throughout the visualized spine, greatest at L4-L5 and L5-S1, moderate to severe. There is a dense rounded calcification in the left upper quadrant consistent with a peripherally calcified splenic artery aneurysm. IMPRESSION: 1. Limited exam due to extensive bony demineralization. 2. There is evidence suggesting compression fractures from the lower thoracic spine through L2. Several of these appear new from the prior, 2009, MRI, but are of unclear chronicity. 3. There are degenerative changes most evident in the lower lumbar spine. 4. Apparent splenic artery aneurysm, with dense peripheral calcification. Electronically Signed   By: Lajean Manes M.D.   On: 09/08/2018 14:07   Ct  Head Wo Contrast  Result Date: 09/08/2018 CLINICAL DATA:  Fall today.  Patient injured the back of her head. EXAM: CT HEAD WITHOUT CONTRAST TECHNIQUE: Contiguous axial images were obtained from the base of the skull through the vertex without intravenous contrast. COMPARISON:  07/21/2004 FINDINGS: Brain: No evidence of acute infarction, hemorrhage, hydrocephalus, extra-axial collection or mass lesion/mass effect. There is ventricular sulcal enlargement reflecting age-appropriate volume loss. Vascular: No hyperdense vessel or unexpected calcification. Skull: Normal. Negative for fracture or focal lesion. Sinuses/Orbits: Globes and orbits are unremarkable. Minor ethmoid sinus mucosal thickening. Remaining sinuses and mastoid air cells are clear. Other: None. IMPRESSION: 1. No acute intracranial abnormalities. 2. Age-appropriate volume loss. 3. No skull fracture. Electronically Signed   By: Lajean Manes M.D.   On: 09/08/2018 16:06   Ct Lumbar Spine Wo Contrast  Result Date: 09/08/2018 CLINICAL DATA:  Fall striking the back of the head.  Low back pain. EXAM: CT LUMBAR SPINE WITHOUT CONTRAST TECHNIQUE: Multidetector CT imaging of the lumbar spine was performed without intravenous contrast administration. Multiplanar CT image  reconstructions were also generated. COMPARISON:  Radiographs from 09/08/2018 FINDINGS: Segmentation: The lowest lumbar type non-rib-bearing vertebra is labeled as L5. Alignment: 6 mm retrolisthesis at L1-2, no change from 2009. 8 mm anterolisthesis at L4-5, not changed from 2009. 3 mm anterolisthesis of L5 on S1, likewise unchanged. Vertebrae: 40% anterior wedging at L1, not appreciably changed from 2009. No new lumbar fracture. Bony demineralization is present. Paraspinal and other soft tissues: Left splenic artery aneurysm, rim calcified, 2.6 by 3.1 cm, roughly similar to 07/13/2008. Aortoiliac atherosclerotic vascular disease. Edema in the abdominal mesentery, nonspecific. Dense calcification on the lowest images centrally in the pelvis, this is probably from a calcified fibroid but we only barely include the edge of what ever processes causing this appearance. Disc levels: T12-L1: Chronic endplate irregularities, suspected mild right foraminal impingement due to intervertebral spurring. L1-2: At least moderate bilateral foraminal impingement due to facet and uncinate spurring. L2-3: No appreciable impingement. L3-4: No appreciable impingement.  Bilateral facet arthropathy. L4-5: No appreciable impingement. Bilateral facet arthropathy and anterolisthesis. L5-S1: Mild right foraminal stenosis due to intervertebral spurring. Bilateral facet spurring. IMPRESSION: 1. Chronic 40% anterior wedge compression fracture at L1, not changed from 2009. Please note that today's lumbar spine MRI did not include the thoracic spine. 2. Chronic degenerative retrolisthesis at several levels in the lumbar spine. In conjunction with spondylosis there appears to be moderate impingement at L1-2, and mild impingement at T12-L1 and L5-S1 as noted above. 3. 3.1 cm left splenic artery aneurysm, roughly similar in size to the 07/13/2008 exam. 4.  Aortic Atherosclerosis (ICD10-I70.0). 5. Nonspecific stranding in the abdominal mesentery. 6.  Suspected uterine fibroid. Electronically Signed   By: Van Clines M.D.   On: 09/08/2018 16:09   Dg Hips Bilat With Pelvis Min 5 Views  Result Date: 09/08/2018 CLINICAL DATA:  Pt reports that she was bathing this am when left knee gave out and caused pt to fall near the bathroom sink. C/o pain in lower back as well as both hips and left knee. Hx of Total Hip on right side and left femur sx per pt. EXAM: DG HIP (WITH OR WITHOUT PELVIS) 5+V BILAT COMPARISON:  None. FINDINGS: No acute fracture. Right hip total arthroplasty is well-seated and aligned. Left hip compression screw and intramedullary rod appear well seated with no evidence of loosening. Left hip joint, SI joints and symphysis pubis are normally spaced and aligned. Bones are extensively demineralized. Soft  tissues are unremarkable. IMPRESSION: 1. No fracture, dislocation or acute finding. No evidence of loosening of the orthopedic hardware. Electronically Signed   By: Lajean Manes M.D.   On: 09/08/2018 14:03       Assessment & Plan  Back pain status post fall Will admit for pain control and evaluation by PT/OT prior to discharge Continue with Flexeril and morphine and Norco PRN for pain  History of A. fib on Coumadin with therapeutic INR, continue with metoprolol and diltiazem for rate control  Hypothyroidism Continue with Synthroid  Hypertension Continue with metoprolol and diltiazem    DVT Prophylaxis Coumadin  AM Labs Ordered, also please review Full Orders  Family Communication: Admission, patients condition and plan of care including tests being ordered have been discussed with the patient and son, Marcello Moores who indicate understanding and agree with the plan and Code Status.  Code Status DNR  Disposition Plan: Home with home health  Time spent in minutes : 38 minutes  Condition GUARDED   @SIGNATURE @

## 2018-09-08 NOTE — Discharge Instructions (Addendum)
You were evaluated in the emergency department for low back pain and pelvic pain after a fall.  You had x-rays and a CAT scan.  We are sending you home with a prescription for some narcotic pain medicine.  Please be aware that this may make you unsteady and tired and possibly constipated.  It will be important for you to follow-up with your primary care doctor.     Information on my medicine - Coumadin   (Warfarin)  Why was Coumadin prescribed for you? Coumadin was prescribed for you because you have a blood clot or a medical condition that can cause an increased risk of forming blood clots. Blood clots can cause serious health problems by blocking the flow of blood to the heart, lung, or brain. Coumadin can prevent harmful blood clots from forming. As a reminder your indication for Coumadin is:   Stroke Prevention Because Of Atrial Fibrillation  What test will check on my response to Coumadin? While on Coumadin (warfarin) you will need to have an INR test regularly to ensure that your dose is keeping you in the desired range. The INR (international normalized ratio) number is calculated from the result of the laboratory test called prothrombin time (PT).  If an INR APPOINTMENT HAS NOT ALREADY BEEN MADE FOR YOU please schedule an appointment to have this lab work done by your health care provider within 7 days. Your INR goal is usually a number between:  2 to 3 or your provider may give you a more narrow range like 2-2.5.  Ask your health care provider during an office visit what your goal INR is.  What  do you need to  know  About  COUMADIN? Take Coumadin (warfarin) exactly as prescribed by your healthcare provider about the same time each day.  DO NOT stop taking without talking to the doctor who prescribed the medication.  Stopping without other blood clot prevention medication to take the place of Coumadin may increase your risk of developing a new clot or stroke.  Get refills before you run  out.  What do you do if you miss a dose? If you miss a dose, take it as soon as you remember on the same day then continue your regularly scheduled regimen the next day.  Do not take two doses of Coumadin at the same time.  Important Safety Information A possible side effect of Coumadin (Warfarin) is an increased risk of bleeding. You should call your healthcare provider right away if you experience any of the following: ? Bleeding from an injury or your nose that does not stop. ? Unusual colored urine (red or dark brown) or unusual colored stools (red or black). ? Unusual bruising for unknown reasons. ? A serious fall or if you hit your head (even if there is no bleeding).  Some foods or medicines interact with Coumadin (warfarin) and might alter your response to warfarin. To help avoid this: ? Eat a balanced diet, maintaining a consistent amount of Vitamin K. ? Notify your provider about major diet changes you plan to make. ? Avoid alcohol or limit your intake to 1 drink for women and 2 drinks for men per day. (1 drink is 5 oz. wine, 12 oz. beer, or 1.5 oz. liquor.)  Make sure that ANY health care provider who prescribes medication for you knows that you are taking Coumadin (warfarin).  Also make sure the healthcare provider who is monitoring your Coumadin knows when you have started a new medication including  herbals and non-prescription products.  Coumadin (Warfarin)  Major Drug Interactions  Increased Warfarin Effect Decreased Warfarin Effect  Alcohol (large quantities) Antibiotics (esp. Septra/Bactrim, Flagyl, Cipro) Amiodarone (Cordarone) Aspirin (ASA) Cimetidine (Tagamet) Megestrol (Megace) NSAIDs (ibuprofen, naproxen, etc.) Piroxicam (Feldene) Propafenone (Rythmol SR) Propranolol (Inderal) Isoniazid (INH) Posaconazole (Noxafil) Barbiturates (Phenobarbital) Carbamazepine (Tegretol) Chlordiazepoxide (Librium) Cholestyramine (Questran) Griseofulvin Oral  Contraceptives Rifampin Sucralfate (Carafate) Vitamin K   Coumadin (Warfarin) Major Herbal Interactions  Increased Warfarin Effect Decreased Warfarin Effect  Garlic Ginseng Ginkgo biloba Coenzyme Q10 Green tea St. Johns wort    Coumadin (Warfarin) FOOD Interactions  Eat a consistent number of servings per week of foods HIGH in Vitamin K (1 serving =  cup)  Collards (cooked, or boiled & drained) Kale (cooked, or boiled & drained) Mustard greens (cooked, or boiled & drained) Parsley *serving size only =  cup Spinach (cooked, or boiled & drained) Swiss chard (cooked, or boiled & drained) Turnip greens (cooked, or boiled & drained)  Eat a consistent number of servings per week of foods MEDIUM-HIGH in Vitamin K (1 serving = 1 cup)  Asparagus (cooked, or boiled & drained) Broccoli (cooked, boiled & drained, or raw & chopped) Brussel sprouts (cooked, or boiled & drained) *serving size only =  cup Lettuce, raw (green leaf, endive, romaine) Spinach, raw Turnip greens, raw & chopped   These websites have more information on Coumadin (warfarin):  FailFactory.se; VeganReport.com.au;

## 2018-09-08 NOTE — ED Provider Notes (Signed)
Signout obtained from Dr. Melina Copa patient with pain and unstable on attempts to ambulate.  Chart reviewed.  Discussed with patient and son.  Son is power of attorney.  Patient has a DNR. Discussed with Dr. Laren Everts and he will see for admission   Pattricia Boss, MD 09/08/18 [phone]

## 2018-09-08 NOTE — ED Triage Notes (Signed)
Pt arrives via EMS from home with reports of mechanical fall. Pt was in the bathroom and her knee gave out, reports falling in a seated position on to the floor. Denies LOC or hitting her head. Endorses lower lumbar/cocyx pain and bilateral thigh and hip pain. Pt on warfarin

## 2018-09-08 NOTE — ED Provider Notes (Signed)
[facility] Provider Note   CSN: [id number] Arrival date & time: 09/08/18  1139     History   Chief Complaint Chief Complaint  Patient presents with  . Fall    HPI Patricia Holt is a 84 y.o. female.  She is brought in by her family and caretaker after a mechanical fall at home today.  She states she was in the bathroom and her left knee gave out causing her to fall onto the floor landing on her buttocks.  She is complaining of 8 out of 10 pain low back and tailbone.  It is causing her to be weak in her legs because of the pain.  It is not associated with any numbness.  She does not think she hit her head and she did not lose consciousness.  She denies any headache chest pain or shortness of breath.  She is on Coumadin for A. fib.  She has had a left femur rod and a right hip replacement.  She is also had a right knee replacement I believe.  The history is provided by the patient and a caregiver.  Fall  This is a new problem. The current episode started 1 to 2 hours ago. The problem occurs constantly. The problem has not changed since onset.Pertinent negatives include no chest pain, no abdominal pain, no headaches and no shortness of breath. The symptoms are aggravated by bending and twisting. Nothing relieves the symptoms. She has tried acetaminophen for the symptoms. The treatment provided no relief.    Past Medical History:  Diagnosis Date  . Aortic valve disorder   . Atrial fibrillation (HCC)    paroxysmal, status post successful vessel cardioversion 5/09, skains,transient left ventricular systolic dysfunction, likely secondary to cardioversion/stunning of the myocardium, no evidence significant CAD, echo 7/09 shows normal ejection fraction  . Carpal tunnel syndrome   . Chronic anticoagulation   . DJD (degenerative joint disease)   . Dysrhythmia   . GERD (gastroesophageal reflux disease)    OTC occasionally  . Goiter   . HTN  (hypertension)   . Hyperlipidemia   . Hypothyroidism   . Macular degeneration   . Mitral regurgitation   . Osteoporosis    , severe, hypercalciuria, [place] since 2005, T10 compression fracture 2009  . Other disorder of calcium metabolism   . Polymyalgia rheumatica ([place])   . Presence of permanent cardiac pacemaker   . Sinoatrial node dysfunction (HCC)   . Sinus drainage    in the morning, clear drainage    Patient Active Problem List   Diagnosis Date Noted  . Primary osteoarthritis of right hip 12/16/2014  . Encounter for therapeutic drug monitoring 12/04/2013  . Pacemaker 12/04/2013  . Long term current use of anticoagulant therapy 08/21/2013  . Atrial fibrillation ([place])   . HTN (hypertension)   . Aortic valve disorder   . Chronic anticoagulation     Past Surgical History:  Procedure Laterality Date  . CARPAL TUNNEL RELEASE    . DILATION AND CURETTAGE OF UTERUS    . EYE SURGERY Bilateral    cataracts with lens   . FEMUR FRACTURE SURGERY    . PACEMAKER INSERTION     Dual chamber permanent pacer insertion for tachy brady syndrome, 01/2009  . TONSILLECTOMY AND ADENOIDECTOMY    . TOTAL HIP ARTHROPLASTY Right 12/16/2014   Procedure: RIGHT TOTAL HIP ARTHROPLASTY ANTERIOR APPROACH;  Surgeon: Hessie Dibble, MD;  Location: [facility];  Service: Orthopedics;  Laterality: Right;  .  TOTAL KNEE ARTHROPLASTY       OB History   None      Home Medications    Prior to Admission medications   Medication Sig Start Date End Date Taking? Authorizing Provider  acetaminophen (TYLENOL) 650 MG CR tablet Take 6,501,300 mg by mouth every 8 (eight) hours as needed for pain.    [provider]  azelastine (ASTELIN) 0.1 % nasal spray Place 2 sprays into both nostrils 2 (two) times daily. 04/26/18   [provider]  CALCIUM PO Take 500 mg by mouth 2 (two) times daily.    [provider]  Cholecalciferol (VITAMIN D PO) Take 1 tablet by mouth daily.    [provider]  cyclobenzaprine (FLEXERIL) 5 MG tablet Take 1 tablet (5 mg total) by mouth 3 (three) times daily as needed for muscle spasms. 09/24/17   Elnora Morrison, MD  DILT-XR 180 MG 24 hr capsule Take 180 mg by mouth daily. 03/02/17   [provider]  docusate sodium (COLACE) 100 MG capsule Take 100 mg by mouth 2 (two) times daily.     [provider]  furosemide (LASIX) 20 MG tablet Take 1 tablet (20 mg total) by mouth daily. 06/26/18   Jerline Pain, MD  levothyroxine (SYNTHROID, LEVOTHROID) 25 MCG tablet TAKE 1 TABLET BY MOUTH DAILY 02/19/15   Lauree Chandler, NP  Menthol, Topical Analgesic, (BENGAY EX) Apply 1 application topically 2 (two) times daily as needed (leg pain).    [provider]  metoprolol succinate (TOPROL-XL) 100 MG 24 hr tablet TAKE 1 TABLET BY MOUTH EVERY DAY WITH OR IMMEDIATELY FOLLOWING A MEAL 09/15/17   Jerline Pain, MD  OVER THE COUNTER MEDICATION Take by mouth daily as needed (nasal drip). Tylenol Cold - Mucous Severe (dextromethorphan, acetaminophen, phenylephrine, guaifenesin) - takes early morning if she wakes up with a nasal drip (1 swig)    [provider]  potassium chloride (K-DUR,KLOR-CON) 10 MEQ tablet Take 1 tablet by mouth daily. 04/17/18   [provider]  tiZANidine (ZANAFLEX) 2 MG tablet Take 1 tablet by mouth 2 (two) times daily. 04/26/18   [provider]  warfarin (COUMADIN) 4 MG tablet USE AS DIRECTED BY COUMADIN CLINIC 06/29/18   Jerline Pain, MD    Family History Family History  Problem Relation Age of Onset  . Hypertension Mother   . Hypertension Father   . Cancer Sister   . Diabetes Sister     Social History Social History   Tobacco Use  . Smoking status: Never Smoker  . Smokeless tobacco: Never Used  Substance Use Topics  . Alcohol use: Yes    Comment: wine and beer occasionally  . Drug use: No     Allergies   Patient has no known allergies.   Review of  Systems Review of Systems  Constitutional: Negative for fever.  HENT: Negative for sore throat.   Eyes: Negative for visual disturbance.  Respiratory: Negative for shortness of breath.   Cardiovascular: Negative for chest pain.  Gastrointestinal: Negative for abdominal pain.  Genitourinary: Positive for pelvic pain. Negative for dysuria.  Musculoskeletal: Positive for back pain. Negative for neck pain.  Skin: Negative for rash.  Neurological: Negative for headaches.     Physical Exam Updated Vital Signs BP (!) 160/90 (BP Location: Left Arm)   Pulse 94   Temp 97.6 F (36.4 C) (Oral)   Resp 11   Ht 5\' 7"  (1.702 m)   Wt 66.2  kg   LMP  (LMP Unknown)   SpO2 93%   BMI 22.87 kg/m   Physical Exam  Constitutional: She appears well-developed and well-nourished. No distress.  HENT:  Head: Normocephalic and atraumatic.  Eyes: Conjunctivae are normal.  Neck: Neck supple.  Cardiovascular: Normal rate.  No murmur heard. Pulmonary/Chest: Effort normal and breath sounds normal. No respiratory distress.  Abdominal: Soft. There is no tenderness. There is no guarding.  Musculoskeletal: She exhibits edema and tenderness. She exhibits no deformity.  She has no cervical thoracic tenderness.  There is minimal lumbar tenderness and mostly tender over her coccyx and sacroiliac area.  She has difficulty in flexing at her hips as that causes her pain in her pelvis.  Is market edema of her lower extremities which is baseline per family.  Nontender knees and ankles.  Neurological: She is alert.  Skin: Skin is warm and dry.  Psychiatric: She has a normal mood and affect.  Nursing note and vitals reviewed.    ED Treatments / Results  Labs (all labs ordered are listed, but only abnormal results are displayed) Labs Reviewed  BASIC METABOLIC PANEL - Abnormal; Notable for the following components:      Result Value   Glucose, Bld 108 (*)    All other components within normal limits  CBC WITH  DIFFERENTIAL/PLATELET - Abnormal; Notable for the following components:   Neutro Abs 7.8 (*)    Abs Immature Granulocytes 0.14 (*)    All other components within normal limits  PROTIME-INR - Abnormal; Notable for the following components:   Prothrombin Time 22.5 (*)    All other components within normal limits  PROTIME-INR - Abnormal; Notable for the following components:   Prothrombin Time 31.1 (*)    All other components within normal limits  I-STAT CHEM 8, ED - Abnormal; Notable for the following components:   Glucose, Bld 107 (*)    TCO2 35 (*)    Hemoglobin 15.6 (*)    All other components within normal limits    EKG None  Radiology Dg Lumbar Spine Complete  Result Date: 09/08/2018 CLINICAL DATA:  Pt reports that she was bathing this am when left knee gave out and caused pt to fall near the bathroom sink. C/o pain in lower back as well as both hips and left knee. Hx of Total Hip on right side and left femur sx per pt. EXAM: LUMBAR SPINE - COMPLETE 4+ VIEW COMPARISON:  Lumbar MRI, 07/13/2008 FINDINGS: Upper lumbar spine is not well visualized radiographically due to the extensive bone demineralization. Apparent compression fractures from the lower thoracic spine through L2. Grade 1 anterolisthesis of L4 on L5. Mild dextroscoliosis, apex at L3. Loss of disc height throughout the visualized spine, greatest at L4-L5 and L5-S1, moderate to severe. There is a dense rounded calcification in the left upper quadrant consistent with a peripherally calcified splenic artery aneurysm. IMPRESSION: 1. Limited exam due to extensive bony demineralization. 2. There is evidence suggesting compression fractures from the lower thoracic spine through L2. Several of these appear new from the prior, 2009, MRI, but are of unclear chronicity. 3. There are degenerative changes most evident in the lower lumbar spine. 4. Apparent splenic artery aneurysm, with dense peripheral calcification. Electronically Signed    By: Lajean Manes M.D.   On: 09/08/2018 14:07   Ct Head Wo Contrast  Result Date: 09/08/2018 CLINICAL DATA:  Fall today.  Patient injured the back of her head. EXAM: CT HEAD WITHOUT CONTRAST TECHNIQUE: Contiguous  axial images were obtained from the base of the skull through the vertex without intravenous contrast. COMPARISON:  07/21/2004 FINDINGS: Brain: No evidence of acute infarction, hemorrhage, hydrocephalus, extra-axial collection or mass lesion/mass effect. There is ventricular sulcal enlargement reflecting age-appropriate volume loss. Vascular: No hyperdense vessel or unexpected calcification. Skull: Normal. Negative for fracture or focal lesion. Sinuses/Orbits: Globes and orbits are unremarkable. Minor ethmoid sinus mucosal thickening. Remaining sinuses and mastoid air cells are clear. Other: None. IMPRESSION: 1. No acute intracranial abnormalities. 2. Age-appropriate volume loss. 3. No skull fracture. Electronically Signed   By: Lajean Manes M.D.   On: 09/08/2018 16:06   Ct Lumbar Spine Wo Contrast  Result Date: 09/08/2018 CLINICAL DATA:  Fall striking the back of the head.  Low back pain. EXAM: CT LUMBAR SPINE WITHOUT CONTRAST TECHNIQUE: Multidetector CT imaging of the lumbar spine was performed without intravenous contrast administration. Multiplanar CT image reconstructions were also generated. COMPARISON:  Radiographs from 09/08/2018 FINDINGS: Segmentation: The lowest lumbar type non-rib-bearing vertebra is labeled as L5. Alignment: 6 mm retrolisthesis at L1-2, no change from 2009. 8 mm anterolisthesis at L4-5, not changed from 2009. 3 mm anterolisthesis of L5 on S1, likewise unchanged. Vertebrae: 40% anterior wedging at L1, not appreciably changed from 2009. No new lumbar fracture. Bony demineralization is present. Paraspinal and other soft tissues: Left splenic artery aneurysm, rim calcified, 2.6 by 3.1 cm, roughly similar to 07/13/2008. Aortoiliac atherosclerotic vascular disease. Edema in  the abdominal mesentery, nonspecific. Dense calcification on the lowest images centrally in the pelvis, this is probably from a calcified fibroid but we only barely include the edge of what ever processes causing this appearance. Disc levels: T12-L1: Chronic endplate irregularities, suspected mild right foraminal impingement due to intervertebral spurring. L1-2: At least moderate bilateral foraminal impingement due to facet and uncinate spurring. L2-3: No appreciable impingement. L3-4: No appreciable impingement.  Bilateral facet arthropathy. L4-5: No appreciable impingement. Bilateral facet arthropathy and anterolisthesis. L5-S1: Mild right foraminal stenosis due to intervertebral spurring. Bilateral facet spurring. IMPRESSION: 1. Chronic 40% anterior wedge compression fracture at L1, not changed from 2009. Please note that today's lumbar spine MRI did not include the thoracic spine. 2. Chronic degenerative retrolisthesis at several levels in the lumbar spine. In conjunction with spondylosis there appears to be moderate impingement at L1-2, and mild impingement at T12-L1 and L5-S1 as noted above. 3. 3.1 cm left splenic artery aneurysm, roughly similar in size to the 07/13/2008 exam. 4.  Aortic Atherosclerosis (ICD10-I70.0). 5. Nonspecific stranding in the abdominal mesentery. 6. Suspected uterine fibroid. Electronically Signed   By: Van Clines M.D.   On: 09/08/2018 16:09   Dg Hips Bilat With Pelvis Min 5 Views  Result Date: 09/08/2018 CLINICAL DATA:  Pt reports that she was bathing this am when left knee gave out and caused pt to fall near the bathroom sink. C/o pain in lower back as well as both hips and left knee. Hx of Total Hip on right side and left femur sx per pt. EXAM: DG HIP (WITH OR WITHOUT PELVIS) 5+V BILAT COMPARISON:  None. FINDINGS: No acute fracture. Right hip total arthroplasty is well-seated and aligned. Left hip compression screw and intramedullary rod appear well seated with no  evidence of loosening. Left hip joint, SI joints and symphysis pubis are normally spaced and aligned. Bones are extensively demineralized. Soft tissues are unremarkable. IMPRESSION: 1. No fracture, dislocation or acute finding. No evidence of loosening of the orthopedic hardware. Electronically Signed   By: Lajean Manes  M.D.   On: 09/08/2018 14:03    Procedures Procedures (including critical care time)  Medications Ordered in ED Medications  morphine 4 MG/ML injection 4 mg (has no administration in time range)     Initial Impression / Assessment and Plan / ED Course  I have reviewed the triage vital signs and the nursing notes.  Pertinent labs & imaging results that were available during my care of the patient were reviewed by me and considered in my medical decision making (see chart for details).  Clinical Course as of Sep 09 749  Sat Sep 08, 2018  1416 Patient's imaging does not show any obvious fracture through her pelvis and her hardware is intact.  Her lumbar spine shows likely multiple compression fractures and osteopenia.  I reviewed this with the patient and she is known about some of this.  She is agreeable to get a CAT scan of her lumbar spine and I will put her in for head CT also due to her trauma on anticoagulation.  She is hopeful to be able to return home.   [MB]    Clinical Course User Index [MB] Hayden Rasmussen, MD     Final Clinical Impressions(s) / ED Diagnoses   Final diagnoses:  Compression fracture of lumbar vertebra, initial encounter, unspecified lumbar vertebral level [facility])  Fall, initial encounter    ED Discharge Orders    None       Hayden Rasmussen, MD 09/09/18 [phone]

## 2018-09-08 NOTE — Progress Notes (Signed)
Pt arrived to room 5N11 accompanied by family. Received report from [place], [place] in ED. See assessment. Will continue to monitor.

## 2018-09-08 NOTE — ED Notes (Signed)
[place] of blood taken. thanks

## 2018-09-08 NOTE — ED Notes (Signed)
Ambulated pt. With walker, was un steady, slightly dragging right foot while stepping, feet look swollen, but family states that she feels uncomfortable going home, and her feelings of weakness are new for her, normally ambulates better. I have relayed information to Dr. Jeanell Sparrow about pt and family concerns. Spo2 stayed 95-100% throughout ambulation.

## 2018-09-09 DIAGNOSIS — S32000A Wedge compression fracture of unspecified lumbar vertebra, initial encounter for closed fracture: Secondary | ICD-10-CM

## 2018-09-09 LAB — PROTIME-INR
INR: 3.05
Prothrombin Time: 31.1 seconds — ABNORMAL HIGH (ref 11.4–15.2)

## 2018-09-09 MED ORDER — FLUTICASONE PROPIONATE 50 MCG/ACT NA SUSP
2.0000 | Freq: Every day | NASAL | Status: DC
  Filled 2018-09-09: qty 16

## 2018-09-09 MED ORDER — WARFARIN SODIUM 2 MG PO TABS
2.0000 mg | ORAL_TABLET | Freq: Once | ORAL | Status: AC
  Administered 2018-09-09: 2 mg via ORAL
  Filled 2018-09-09: qty 1

## 2018-09-09 MED ORDER — FLUTICASONE PROPIONATE 50 MCG/ACT NA SUSP
2.0000 | Freq: Every day | NASAL | Status: DC
  Administered 2018-09-09 – 2018-09-12 (×4): 2 via NASAL
  Filled 2018-09-09: qty 16

## 2018-09-09 NOTE — Progress Notes (Signed)
PROGRESS NOTE    Patricia Holt  ZOX:[id number] DOB: 08-Jun-1924 DOA: 09/08/2018 PCP: Lavone Orn, MD  Outpatient Specialists:   Brief Narrative: Patient is a 84 year old female, with multiple medical and cardiac problems.  Patient carries diagnosis of atrial fibrillation, aortic valve disorder, couple tunnel syndrome, hypertension, hyperlipidemia, status post permanent pacemaker placement, S-A node dysfunction amongst other medical and cardiac history.  Patient was admitted with pain around the buttock area/tailbone following a fall at home.  Patient continues to report pain.  PT OT input is highly appreciated.  Likely, patient will need rehab.  Assessment & Plan:   Active Problems:   Acute pain  Fall at home with buttock/Tailbone pain: Optimize pain control. PT OT input is appreciated. Likely rehab. Patient is on Coumadin, assess safety of Coumadin if patient continues to fall.  Chronic A. fib on Coumadin: INR is 3.05 today.   Pharmacy input is appreciated.   Continue to monitor PT/INR.   Continue metoprolol and diltiazem for rate control  Hypothyroidism: Continue with Synthroid  Hypertension: Continue with metoprolol and diltiazem  DVT Prophylaxis Coumadin Family Communication: A Code Status DNR  Disposition Plan:  Likely rehab.    Consultants:   None  Procedures:   None  Antimicrobials:   None   Subjective: No new complaints. Patient continues to report pain around the buttock/tailbone area  Objective: Vitals:   09/08/18 1629 09/08/18 1823 09/08/18 2030 09/09/18 0429  BP: 129/80 127/77 109/68 (!) 100/59  Pulse: (!) 51 (!) 131 (!) 102 66  Resp: 16 (!) 22    Temp:  97.9 F (36.6 C) (!) 97.5 F (36.4 C) (!) 97.5 F (36.4 C)  TempSrc:  Oral Oral Oral  SpO2: 93% 93% 94% 98%  Weight:  66.7 kg    Height:  5\' 7"  (1.702 m)      Intake/Output Summary (Last 24 hours) at 09/09/2018 1124 Last data filed at 09/09/2018 0900 Gross per 24 hour    Intake 240 ml  Output -  Net 240 ml   Filed Weights   09/08/18 1147 09/08/18 1823  Weight: 66.2 kg 66.7 kg    Examination:  General exam: Appears calm and comfortable  Respiratory system: Clear to auscultation. Cardiovascular system: S1 & S2 heard.  No pedal edema. Gastrointestinal system: Abdomen is nondistended, soft and nontender. No organomegaly or masses felt. Normal bowel sounds heard. Central nervous system: Alert and oriented. No focal neurological deficits. Extremities: No leg edema.    Data Reviewed: I have personally reviewed following labs and imaging studies  CBC: Recent Labs  Lab 09/08/18 1151 09/08/18 1218  WBC 9.6  --   NEUTROABS 7.8*  --   HGB 14.0 15.6*  HCT 45.3 46.0  MCV 93.2  --   PLT 388  --    Basic Metabolic Panel: Recent Labs  Lab 09/08/18 1151 09/08/18 1218  NA 137 138  K 4.4 4.4  CL 100 98  CO2 30  --   GLUCOSE 108* 107*  BUN 14 18  CREATININE 0.77 0.80  CALCIUM 9.8  --    GFR: Estimated Creatinine Clearance: 43.6 mL/min (by C-G formula based on SCr of 0.8 mg/dL). Liver Function Tests: No results for input(s): AST, ALT, ALKPHOS, BILITOT, PROT, ALBUMIN in the last 168 hours. No results for input(s): LIPASE, AMYLASE in the last 168 hours. No results for input(s): AMMONIA in the last 168 hours. Coagulation Profile: Recent Labs  Lab 09/08/18 1151 09/09/18 0334  INR 2.01 3.05   Cardiac Enzymes:  No results for input(s): CKTOTAL, CKMB, CKMBINDEX, TROPONINI in the last 168 hours. BNP (last 3 results) No results for input(s): PROBNP in the last 8760 hours. HbA1C: No results for input(s): HGBA1C in the last 72 hours. CBG: No results for input(s): GLUCAP in the last 168 hours. Lipid Profile: No results for input(s): CHOL, HDL, LDLCALC, TRIG, CHOLHDL, LDLDIRECT in the last 72 hours. Thyroid Function Tests: No results for input(s): TSH, T4TOTAL, FREET4, T3FREE, THYROIDAB in the last 72 hours. Anemia Panel: No results for  input(s): VITAMINB12, FOLATE, FERRITIN, TIBC, IRON, RETICCTPCT in the last 72 hours. Urine analysis:    Component Value Date/Time   COLORURINE YELLOW 12/03/2014 1408   APPEARANCEUR CLEAR 12/03/2014 1408   LABSPEC 1.010 12/03/2014 1408   PHURINE 7.5 12/03/2014 1408   GLUCOSEU NEGATIVE 12/03/2014 1408   HGBUR NEGATIVE 12/03/2014 1408   BILIRUBINUR NEGATIVE 12/03/2014 1408   KETONESUR NEGATIVE 12/03/2014 1408   PROTEINUR NEGATIVE 12/03/2014 1408   UROBILINOGEN 1.0 12/03/2014 1408   NITRITE NEGATIVE 12/03/2014 1408   LEUKOCYTESUR NEGATIVE 12/03/2014 1408   Sepsis Labs: @LABRCNTIP (procalcitonin:4,lacticidven:4)  )No results found for this or any previous visit (from the past 240 hour(s)).       Radiology Studies: Dg Lumbar Spine Complete  Result Date: 09/08/2018 CLINICAL DATA:  Pt reports that she was bathing this am when left knee gave out and caused pt to fall near the bathroom sink. C/o pain in lower back as well as both hips and left knee. Hx of Total Hip on right side and left femur sx per pt. EXAM: LUMBAR SPINE - COMPLETE 4+ VIEW COMPARISON:  Lumbar MRI, 07/13/2008 FINDINGS: Upper lumbar spine is not well visualized radiographically due to the extensive bone demineralization. Apparent compression fractures from the lower thoracic spine through L2. Grade 1 anterolisthesis of L4 on L5. Mild dextroscoliosis, apex at L3. Loss of disc height throughout the visualized spine, greatest at L4-L5 and L5-S1, moderate to severe. There is a dense rounded calcification in the left upper quadrant consistent with a peripherally calcified splenic artery aneurysm. IMPRESSION: 1. Limited exam due to extensive bony demineralization. 2. There is evidence suggesting compression fractures from the lower thoracic spine through L2. Several of these appear new from the prior, 2009, MRI, but are of unclear chronicity. 3. There are degenerative changes most evident in the lower lumbar spine. 4. Apparent splenic  artery aneurysm, with dense peripheral calcification. Electronically Signed   By: Lajean Manes M.D.   On: 09/08/2018 14:07   Ct Head Wo Contrast  Result Date: 09/08/2018 CLINICAL DATA:  Fall today.  Patient injured the back of her head. EXAM: CT HEAD WITHOUT CONTRAST TECHNIQUE: Contiguous axial images were obtained from the base of the skull through the vertex without intravenous contrast. COMPARISON:  07/21/2004 FINDINGS: Brain: No evidence of acute infarction, hemorrhage, hydrocephalus, extra-axial collection or mass lesion/mass effect. There is ventricular sulcal enlargement reflecting age-appropriate volume loss. Vascular: No hyperdense vessel or unexpected calcification. Skull: Normal. Negative for fracture or focal lesion. Sinuses/Orbits: Globes and orbits are unremarkable. Minor ethmoid sinus mucosal thickening. Remaining sinuses and mastoid air cells are clear. Other: None. IMPRESSION: 1. No acute intracranial abnormalities. 2. Age-appropriate volume loss. 3. No skull fracture. Electronically Signed   By: Lajean Manes M.D.   On: 09/08/2018 16:06   Ct Lumbar Spine Wo Contrast  Result Date: 09/08/2018 CLINICAL DATA:  Fall striking the back of the head.  Low back pain. EXAM: CT LUMBAR SPINE WITHOUT CONTRAST TECHNIQUE: Multidetector CT imaging of the  lumbar spine was performed without intravenous contrast administration. Multiplanar CT image reconstructions were also generated. COMPARISON:  Radiographs from 09/08/2018 FINDINGS: Segmentation: The lowest lumbar type non-rib-bearing vertebra is labeled as L5. Alignment: 6 mm retrolisthesis at L1-2, no change from 2009. 8 mm anterolisthesis at L4-5, not changed from 2009. 3 mm anterolisthesis of L5 on S1, likewise unchanged. Vertebrae: 40% anterior wedging at L1, not appreciably changed from 2009. No new lumbar fracture. Bony demineralization is present. Paraspinal and other soft tissues: Left splenic artery aneurysm, rim calcified, 2.6 by 3.1 cm, roughly  similar to 07/13/2008. Aortoiliac atherosclerotic vascular disease. Edema in the abdominal mesentery, nonspecific. Dense calcification on the lowest images centrally in the pelvis, this is probably from a calcified fibroid but we only barely include the edge of what ever processes causing this appearance. Disc levels: T12-L1: Chronic endplate irregularities, suspected mild right foraminal impingement due to intervertebral spurring. L1-2: At least moderate bilateral foraminal impingement due to facet and uncinate spurring. L2-3: No appreciable impingement. L3-4: No appreciable impingement.  Bilateral facet arthropathy. L4-5: No appreciable impingement. Bilateral facet arthropathy and anterolisthesis. L5-S1: Mild right foraminal stenosis due to intervertebral spurring. Bilateral facet spurring. IMPRESSION: 1. Chronic 40% anterior wedge compression fracture at L1, not changed from 2009. Please note that today's lumbar spine MRI did not include the thoracic spine. 2. Chronic degenerative retrolisthesis at several levels in the lumbar spine. In conjunction with spondylosis there appears to be moderate impingement at L1-2, and mild impingement at T12-L1 and L5-S1 as noted above. 3. 3.1 cm left splenic artery aneurysm, roughly similar in size to the 07/13/2008 exam. 4.  Aortic Atherosclerosis (ICD10-I70.0). 5. Nonspecific stranding in the abdominal mesentery. 6. Suspected uterine fibroid. Electronically Signed   By: Van Clines M.D.   On: 09/08/2018 16:09   Dg Hips Bilat With Pelvis Min 5 Views  Result Date: 09/08/2018 CLINICAL DATA:  Pt reports that she was bathing this am when left knee gave out and caused pt to fall near the bathroom sink. C/o pain in lower back as well as both hips and left knee. Hx of Total Hip on right side and left femur sx per pt. EXAM: DG HIP (WITH OR WITHOUT PELVIS) 5+V BILAT COMPARISON:  None. FINDINGS: No acute fracture. Right hip total arthroplasty is well-seated and aligned. Left  hip compression screw and intramedullary rod appear well seated with no evidence of loosening. Left hip joint, SI joints and symphysis pubis are normally spaced and aligned. Bones are extensively demineralized. Soft tissues are unremarkable. IMPRESSION: 1. No fracture, dislocation or acute finding. No evidence of loosening of the orthopedic hardware. Electronically Signed   By: Lajean Manes M.D.   On: 09/08/2018 14:03        Scheduled Meds: . diltiazem  180 mg Oral Daily  . docusate sodium  100 mg Oral BID  . fluticasone  2 spray Each Nare Daily  . furosemide  20 mg Oral Daily  . levothyroxine  25 mcg Oral QAC breakfast  . metoprolol succinate  100 mg Oral Daily  . sodium chloride flush  3 mL Intravenous Q12H  . warfarin  2 mg Oral ONCE-1800  . Warfarin - Pharmacist Dosing Inpatient   Does not apply q1800   Continuous Infusions: . sodium chloride       LOS: 1 day    Time spent: 25 minutes.    Dana Allan, MD  [facility] Pager #: [phone] 7PM-7AM contact night coverage as above

## 2018-09-09 NOTE — Evaluation (Signed)
Physical Therapy Evaluation Patient Details Name: Patricia Holt MRN: [id number] DOB: July 26, 1924 Today's Date: 09/09/2018   History of Present Illness  Patricia Holt  is a 84 y.o. female, with past medical history significant for A. fib hypertension and hyperlipidemia who was in her usual state of health until she fell in the bathroom while trying to hold on to the sink.  She fell on her buttocks but starting developing lower back pain.  Work-up in the emergency room was not significant for any acute abnormality however the patient continued to be in pain and could not ambulate.  has a past medical history of Aortic valve disorder, Atrial fibrillation ([place]), Carpal tunnel syndrome,  HTN (hypertension), Hyperlipidemia,  Macular degeneration,  Osteoporosis,  Polymyalgia rheumatica ([place]), Presence of permanent cardiac pacemaker  Clinical Impression  Pt admitted with above diagnosis. Pt currently with functional limitations due to the deficits listed below (see PT Problem List). Living alone with Aide and family help prn; Presents with weakness, pain, decr functional mobility, incr fall risk;  Pt will benefit from skilled PT to increase their independence and safety with mobility to allow discharge to the venue listed below.       Follow Up Recommendations SNF    Equipment Recommendations  Rolling walker with 5" wheels;3in1 (PT)    Recommendations for Other Services       Precautions / Restrictions Precautions Precautions: Fall Precaution Comments: Consider observing back precautions for comfort      Mobility  Bed Mobility Overal bed mobility: Needs Assistance Bed Mobility: Rolling;Sidelying to Sit Rolling: Min assist Sidelying to sit: Min assist       General bed mobility comments:  Cues for technqiue; Min assis to elevate trunk to sit  Transfers Overall transfer level: Needs assistance Equipment used: Rolling walker (2 wheeled) Transfers: Sit to/from Stand Sit to Stand:  Mod assist;+2 physical assistance         General transfer comment: Noting good initiation/lift off, but tends to stop when pain comes on about halfway up, and then has a lot of difficulty moving hands from shair to RW; Heavy mod assist to steady and power all the way up to stand  Ambulation/Gait Ambulation/Gait assistance: +2 physical assistance;Mod assist Gait Distance (Feet): (pivotal steps bed to chair) Assistive device: Rolling walker (2 wheeled) Gait Pattern/deviations: Shuffle     General Gait Details: heavy dependence on UE support  Stairs            Wheelchair Mobility    Modified Rankin (Stroke Patients Only)       Balance                                             Pertinent Vitals/Pain Pain Assessment: Faces Faces Pain Scale: Hurts even more Pain Location: buttocks Pain Descriptors / Indicators: Grimacing Pain Intervention(s): Monitored during session;Premedicated before session;Repositioned    Home Living Family/patient expects to be discharged to:: Private residence Living Arrangements: Alone Available Help at Discharge: Family;Personal care attendant(Aide comes a few days a week) Type of Home: House Home Access: Level entry     Home Layout: One level Home Equipment: Walker - 2 wheels;Walker - 4 wheels;Tub bench;Hand held shower head      Prior Function Level of Independence: Independent with assistive device(s)         Comments: Aide assists with driving, IADLs, home management  Hand Dominance        Extremity/Trunk Assessment   Upper Extremity Assessment Upper Extremity Assessment: Generalized weakness    Lower Extremity Assessment Lower Extremity Assessment: Generalized weakness       Communication   Communication: HOH  Cognition Arousal/Alertness: Awake/alert Behavior During Therapy: WFL for tasks assessed/performed Overall Cognitive Status: Within Functional Limits for tasks assessed(for simple  mobility)                                        General Comments      Exercises     Assessment/Plan    PT Assessment Patient needs continued PT services  PT Problem List Decreased strength;Decreased range of motion;Decreased activity tolerance;Decreased balance;Decreased mobility;Decreased knowledge of use of DME;Decreased safety awareness;Decreased knowledge of precautions;Pain       PT Treatment Interventions DME instruction;Gait training;Functional mobility training;Therapeutic activities;Therapeutic exercise;Balance training;Neuromuscular re-education;Patient/family education    PT Goals (Current goals can be found in the Care Plan section)  Acute Rehab PT Goals Patient Stated Goal: get better and get home PT Goal Formulation: With patient Time For Goal Achievement: 09/23/18 Potential to Achieve Goals: Good    Frequency Min 2X/week   Barriers to discharge        Co-evaluation               AM-PAC PT "6 Clicks" Daily Activity  Outcome Measure Difficulty turning over in bed (including adjusting bedclothes, sheets and blankets)?: A Little Difficulty moving from lying on back to sitting on the side of the bed? : Unable Difficulty sitting down on and standing up from a chair with arms (e.g., wheelchair, bedside commode, etc,.)?: Unable Help needed moving to and from a bed to chair (including a wheelchair)?: A Lot Help needed walking in hospital room?: A Lot Help needed climbing 3-5 steps with a railing? : Total 6 Click Score: 10    End of Session Equipment Utilized During Treatment: Gait belt Activity Tolerance: Patient tolerated treatment well;Patient limited by pain Patient left: in chair;with call bell/phone within reach;with chair alarm set Nurse Communication: Mobility status PT Visit Diagnosis: Unsteadiness on feet (R26.81);Other abnormalities of gait and mobility (R26.89);Repeated falls (R29.6);Muscle weakness (generalized)  (M62.81);Pain Pain - Right/Left: (Buttocks) Pain - part of body: (Buttocks)    Time: 6122-4497 PT Time Calculation (min) (ACUTE ONLY): 26 min   Charges:   PT Evaluation $PT Eval Moderate Complexity: 1 Mod PT Treatments $Therapeutic Activity: 8-22 mins        Roney Marion, PT  Acute Rehabilitation Services Pager [phone] Office [place] 09/09/2018, 1:09 PM

## 2018-09-09 NOTE — Progress Notes (Signed)
ANTICOAGULATION CONSULT NOTE - Follow-Up Consult  Pharmacy Consult for warfarin Indication: atrial fibrillation  No Known Allergies  Patient Measurements: Height: 5\' 7"  (170.2 cm) Weight: 147 lb (66.7 kg) IBW/kg (Calculated) : 61.6  Vital Signs: Temp: 97.5 F (36.4 C) (11/10 0429) Temp Source: Oral (11/10 0429) BP: 100/59 (11/10 0429) Pulse Rate: 66 (11/10 0429)  Labs: Recent Labs    09/08/18 1151 09/08/18 1218 09/09/18 0334  HGB 14.0 15.6*  --   HCT 45.3 46.0  --   PLT 388  --   --   LABPROT 22.5*  --  31.1*  INR 2.01  --  3.05  CREATININE 0.77 0.80  --     Estimated Creatinine Clearance: 43.6 mL/min (by C-G formula based on SCr of 0.8 mg/dL).   Medical History: Past Medical History:  Diagnosis Date  . Aortic valve disorder   . Atrial fibrillation (HCC)    paroxysmal, status post successful vessel cardioversion 5/09, skains,transient left ventricular systolic dysfunction, likely secondary to cardioversion/stunning of the myocardium, no evidence significant CAD, echo 7/09 shows normal ejection fraction  . Carpal tunnel syndrome   . Chronic anticoagulation   . DJD (degenerative joint disease)   . Dysrhythmia   . GERD (gastroesophageal reflux disease)    OTC occasionally  . Goiter   . HTN (hypertension)   . Hyperlipidemia   . Hypothyroidism   . Macular degeneration   . Mitral regurgitation   . Osteoporosis    , severe, hypercalciuria, [place] since 2005, T10 compression fracture 2009  . Other disorder of calcium metabolism   . Polymyalgia rheumatica ([place])   . Presence of permanent cardiac pacemaker   . Sinoatrial node dysfunction (HCC)   . Sinus drainage    in the morning, clear drainage   Assessment: 89 yof presented to the hospital s/p mechanical fall. She is on warfarin for history of afib. PTA dose is 6 mg on Mon and Fri, and 4 mg all other days. Admit INR was therapeutic at 2.01. No bleeding noted including negative CT head.   INR this morning is  slightly above goal at 3.05. Of note, patient missed last night's warfarin dose. Per RN, patient was brought up to the floor around 1800 and dose was never received. Patient also reports very poor appetite since she came into the hospital. With missed dose yesterday and since INR right at goal, will give a reduced dose of warfarin tonight.  Goal of Therapy:  INR 2-3 Monitor platelets by anticoagulation protocol: Yes   Plan:  Warfarin 2 mg PO x1 tonight Daily INR  Jackson Latino, PharmD PGY1 Pharmacy Resident Phone [phone] 09/09/2018     8:21 AM

## 2018-09-09 NOTE — NC FL2 (Signed)
[place] LEVEL OF CARE SCREENING TOOL     IDENTIFICATION  Patient Name: Patricia Holt Birthdate: [birth year] Sex: female Admission Date (Current Location): 09/08/2018  [facility] and [place] Number:  Herbalist and Address:  [facility], [place] [address] [place], [place] [address]      Provider Number: [id number]  Attending Physician Name and Address:  Bonnell Public, MD  Relative Name and Phone Number:       Current Level of Care: Hospital Recommended Level of Care: [place] Prior Approval Number:    Date Approved/Denied:   PASRR Number: [id number]  Discharge Plan: SNF    Current Diagnoses: Patient Active Problem List   Diagnosis Date Noted  . Acute pain 09/08/2018  . Primary osteoarthritis of right hip 12/16/2014  . Encounter for therapeutic drug monitoring 12/04/2013  . Pacemaker 12/04/2013  . Long term current use of anticoagulant therapy 08/21/2013  . Atrial fibrillation ([place])   . HTN (hypertension)   . Aortic valve disorder   . Chronic anticoagulation     Orientation RESPIRATION BLADDER Height & Weight     Time, Situation, Place, Self  O2(Nasal Cannula 2L) External catheter, Incontinent(placed 09/08/18) Weight: 147 lb (66.7 kg) Height:  5\' 7"  (170.2 cm)  BEHAVIORAL SYMPTOMS/MOOD NEUROLOGICAL BOWEL NUTRITION STATUS      Continent Diet(Heart healthy, thin liquids)  AMBULATORY STATUS COMMUNICATION OF NEEDS Skin   Extensive Assist Verbally Normal                       Personal Care Assistance Level of Assistance  Bathing, Feeding, Dressing Bathing Assistance: Maximum assistance Feeding assistance: Limited assistance Dressing Assistance: Maximum assistance     Functional Limitations Info  Sight, Hearing, Speech Sight Info: Adequate Hearing Info: Adequate Speech Info: Adequate    SPECIAL CARE FACTORS FREQUENCY  PT (By licensed PT), OT (By licensed OT)     PT Frequency:  2x OT Frequency: 2x            Contractures Contractures Info: Not present    Additional Factors Info  Code Status, Allergies Code Status Info: DNR Allergies Info: NO known allergies           Current Medications (09/09/2018):  This is the current hospital active medication list Current Facility-Administered Medications  Medication Dose Route Frequency Provider Last Rate Last Dose  . 0.9 %  sodium chloride infusion  250 mL Intravenous PRN Merton Border, MD      . acetaminophen (TYLENOL) tablet 650 mg  650 mg Oral Q8H PRN Merton Border, MD      . cyclobenzaprine (FLEXERIL) tablet 5 mg  5 mg Oral TID PRN Merton Border, MD   5 mg at 09/09/18 0741  . diltiazem (CARDIZEM CD) 24 hr capsule 180 mg  180 mg Oral Daily Merton Border, MD   180 mg at 09/09/18 1017  . docusate sodium (COLACE) capsule 100 mg  100 mg Oral BID Merton Border, MD   100 mg at 09/09/18 1017  . fluticasone (FLONASE) 50 MCG/ACT nasal spray 2 spray  2 spray Each Nare Daily Rise Patience, MD   2 spray at 09/09/18 0144  . furosemide (LASIX) tablet 20 mg  20 mg Oral Daily Merton Border, MD   20 mg at 09/09/18 1018  . HYDROcodone-acetaminophen (NORCO/VICODIN) 5-325 MG per tablet 1-2 tablet  1-2 tablet Oral Q4H PRN Merton Border, MD   2 tablet at 09/09/18 0740  .  levothyroxine (SYNTHROID, LEVOTHROID) tablet 25 mcg  25 mcg Oral QAC breakfast Merton Border, MD   25 mcg at 09/09/18 0543  . metoprolol succinate (TOPROL-XL) 24 hr tablet 100 mg  100 mg Oral Daily Merton Border, MD   100 mg at 09/09/18 1017  . morphine 2 MG/ML injection 2 mg  2 mg Intravenous Q4H PRN Merton Border, MD   2 mg at 09/09/18 1125  . sodium chloride flush (NS) 0.9 % injection 3 mL  3 mL Intravenous Q12H Merton Border, MD   3 mL at 09/09/18 0543  . sodium chloride flush (NS) 0.9 % injection 3 mL  3 mL Intravenous PRN Merton Border, MD      . warfarin (COUMADIN) tablet 2 mg  2 mg Oral ONCE-1800 Lore, Melissa A, RPH      . Warfarin - Pharmacist Dosing Inpatient   Does not  apply q1800 Rumbarger, Valeda Malm [facility]         Discharge Medications: Please see discharge summary for a list of discharge medications.  Relevant Imaging Results:  Relevant Lab Results:   Additional Information SSN: [id number]  Eileen Stanford, LCSW

## 2018-09-10 ENCOUNTER — Other Ambulatory Visit: Payer: Self-pay | Admitting: Cardiology

## 2018-09-10 LAB — PROTIME-INR
INR: 4.19
Prothrombin Time: 39.8 seconds — ABNORMAL HIGH (ref 11.4–15.2)

## 2018-09-10 MED ORDER — MEGESTROL ACETATE 400 MG/10ML PO SUSP
400.0000 mg | Freq: Two times a day (BID) | ORAL | Status: DC
  Administered 2018-09-10 – 2018-09-12 (×5): 400 mg via ORAL
  Filled 2018-09-10 (×5): qty 10

## 2018-09-10 MED ORDER — BISACODYL 5 MG PO TBEC
10.0000 mg | DELAYED_RELEASE_TABLET | Freq: Once | ORAL | Status: AC
  Administered 2018-09-10: 10 mg via ORAL
  Filled 2018-09-10: qty 2

## 2018-09-10 MED ORDER — OXYCODONE HCL ER 10 MG PO T12A
10.0000 mg | EXTENDED_RELEASE_TABLET | Freq: Two times a day (BID) | ORAL | Status: DC
  Administered 2018-09-10 – 2018-09-12 (×5): 10 mg via ORAL
  Filled 2018-09-10 (×6): qty 1

## 2018-09-10 MED ORDER — SODIUM CHLORIDE 0.9 % IV SOLN
INTRAVENOUS | Status: AC
  Administered 2018-09-10: 17:00:00 via INTRAVENOUS

## 2018-09-10 MED ORDER — POLYETHYLENE GLYCOL 3350 17 G PO PACK
17.0000 g | PACK | Freq: Every day | ORAL | Status: DC
  Administered 2018-09-10 – 2018-09-12 (×3): 17 g via ORAL
  Filled 2018-09-10 (×3): qty 1

## 2018-09-10 NOTE — Progress Notes (Signed)
CSW met with patient to discuss SNF options and CSW provided list to patient. Patient states family is coming to hospital who can assist her with SNF choice. She reports that she would like CSW to call her daughter in law Patricia Holt to discuss SNF options.   CSW lvm with Patricia Holt.  Will continue to follow up.  [place], [place]

## 2018-09-10 NOTE — Plan of Care (Signed)

## 2018-09-10 NOTE — Progress Notes (Signed)
ANTICOAGULATION CONSULT NOTE - Follow Up Consult  Pharmacy Consult for Coumadin Indication: atrial fibrillation  No Known Allergies  Patient Measurements: Height: 5\' 7"  (170.2 cm) Weight: 147 lb (66.7 kg) IBW/kg (Calculated) : 61.6  Vital Signs: Temp: 97.6 F (36.4 C) (11/11 0525) Temp Source: Oral (11/11 0525) BP: 106/57 (11/11 0525) Pulse Rate: 72 (11/11 0525)  Labs: Recent Labs    09/08/18 1151 09/08/18 1218 09/09/18 0334 09/10/18 0307  HGB 14.0 15.6*  --   --   HCT 45.3 46.0  --   --   PLT 388  --   --   --   LABPROT 22.5*  --  31.1* 39.8*  INR 2.01  --  3.05 4.19*  CREATININE 0.77 0.80  --   --     Estimated Creatinine Clearance: 43.6 mL/min (by C-G formula based on SCr of 0.8 mg/dL).   Assessment:  Anticoag: Warfarin for hx afib. CT head negative. Hgb 15.6, plts 388, no bleeding noted. INR 2.01>3.05>4.19 today. - PTA 6mg  Mon + Fri, 4mg  all other days - Missed dose 11/9  Goal of Therapy:  INR 2-3 Monitor platelets by anticoagulation protocol: Yes   Plan:  Hold Coumadin Daily INR  Patricia Holt, PharmD, [place] Clinical Staff Pharmacist [place], [place] 09/10/2018,7:34 AM

## 2018-09-10 NOTE — Progress Notes (Signed)
CRITICAL VALUE ALERT  Critical Value:  INR 4.19  Date & Time Notied:  09/10/2018 @ 0442  Provider Notified: [facility]   Orders Received/Actions taken: Pending/None

## 2018-09-10 NOTE — Evaluation (Signed)
Occupational Therapy Evaluation Patient Details Name: Patricia Holt MRN: [id number] DOB: 04-Aug-1924 Today's Date: 09/10/2018    History of Present Illness Patricia Holt  is a 84 y.o. female, with past medical history significant for A. fib hypertension and hyperlipidemia who was in her usual state of health until she fell in the bathroom while trying to hold on to the sink.  She fell on her buttocks but starting developing lower back pain.  Work-up in the emergency room was not significant for any acute abnormality however the patient continued to be in pain and could not ambulate.  has a past medical history of Aortic valve disorder, Atrial fibrillation ([place]), Carpal tunnel syndrome,  HTN (hypertension), Hyperlipidemia,  Macular degeneration,  Osteoporosis,  Polymyalgia rheumatica ([place]), Presence of permanent cardiac pacemaker   Clinical Impression   Pt admitted with the above diagnosis and has the deficits listed below. Pt would benefit from cont OT to increase independence with basic adls and adl transfers so she can eventually return home just with her aid that comes 4x week.  Feel at this point pt needs more supervision and assist and may benefit from further rehab at SNF level of care. Pt could not function at home right now without 24 hour care.  Will continue to follow.      Follow Up Recommendations  SNF;Supervision/Assistance - 24 hour    Equipment Recommendations  None recommended by OT    Recommendations for Other Services       Precautions / Restrictions Precautions Precautions: Fall Precaution Comments: Consider observing back precautions for comfort Restrictions Weight Bearing Restrictions: No      Mobility Bed Mobility Overal bed mobility: Needs Assistance Bed Mobility: Rolling;Sidelying to Sit Rolling: Min assist Sidelying to sit: Min assist       General bed mobility comments:  Cues for technqiue; Min assis to elevate trunk to sit  Transfers Overall  transfer level: Needs assistance Equipment used: Rolling walker (2 wheeled) Transfers: Sit to/from [facility] Sit to Stand: Mod assist Stand pivot transfers: Mod assist       General transfer comment: Noting good initiation/lift off, but tends to stop when pain comes on about halfway up, and then has a lot of difficulty moving hands from shair to RW; Heavy mod assist to steady and power all the way up to stand    Balance Overall balance assessment: Needs assistance Sitting-balance support: Feet supported Sitting balance-Leahy Scale: Fair     Standing balance support: During functional activity;Bilateral upper extremity supported Standing balance-Leahy Scale: Poor Standing balance comment: Pt must have outside support to reman standing. Pt has walked with walker for 1.5 years now.                           ADL either performed or assessed with clinical judgement   ADL Overall ADL's : Needs assistance/impaired Eating/Feeding: Independent;Sitting   Grooming: Set up;Sitting   Upper Body Bathing: Set up;Sitting   Lower Body Bathing: Maximal assistance;Sit to/from stand;Cueing for compensatory techniques   Upper Body Dressing : Set up;Sitting   Lower Body Dressing: Maximal assistance;Sit to/from stand;Cueing for compensatory techniques   Toilet Transfer: Moderate assistance;BSC;Stand-pivot Toilet Transfer Details (indicate cue type and reason): Pt very anxious on her feet always wanting two people in room.  Pt with dizzy spells when sitting on EOB to stand but BP was normal. Toileting- Clothing Manipulation and Hygiene: Maximal assistance;Sit to/from stand;Cueing for compensatory techniques Toileting -  Clothing Manipulation Details (indicate cue type and reason): Pt held to therapist while second person managed clothing.      Functional mobility during ADLs: Moderate assistance;Rolling walker General ADL Comments: Pt very limited due to pain in back  and buttocks.  Pt is a retired Marine scientist.  Once encouaged to do more, pt did but was not wanting to ambulate or do any adls in standing at this time.      Vision Baseline Vision/History: No visual deficits Patient Visual Report: No change from baseline Vision Assessment?: No apparent visual deficits     Perception Perception Perception Tested?: No   Praxis Praxis Praxis tested?: Not tested    Pertinent Vitals/Pain Pain Assessment: 0-10 Pain Score: 8  Pain Location: buttocks/back Pain Descriptors / Indicators: Grimacing Pain Intervention(s): Limited activity within patient's tolerance;Monitored during session;Repositioned     Hand Dominance Right   Extremity/Trunk Assessment Upper Extremity Assessment Upper Extremity Assessment: Overall WFL for tasks assessed   Lower Extremity Assessment Lower Extremity Assessment: Defer to PT evaluation   Cervical / Trunk Assessment Cervical / Trunk Assessment: Other exceptions Cervical / Trunk Exceptions: significant back pain.   Communication Communication Communication: HOH   Cognition Arousal/Alertness: Awake/alert Behavior During Therapy: WFL for tasks assessed/performed Overall Cognitive Status: Within Functional Limits for tasks assessed                                 General Comments: Pt most limited by pain.     General Comments  Pt most limited by pain.  Lives alone but has aid that helps her around house and with driving.  Pt may need more assist at this point.    Exercises     Shoulder Instructions      Home Living Family/patient expects to be discharged to:: Private residence Living Arrangements: Alone Available Help at Discharge: Family;Personal care attendant Type of Home: House Home Access: Level entry     Home Layout: One level     Bathroom Shower/Tub: Teacher, early years/pre: Handicapped height     Home Equipment: Environmental consultant - 2 wheels;Walker - 4 wheels;Tub bench;Hand held shower  head;Bedside commode          Prior Functioning/Environment Level of Independence: Independent with assistive device(s)        Comments: Aide assists with driving, IADLs, home management        OT Problem List: Decreased activity tolerance;Impaired balance (sitting and/or standing);Decreased knowledge of use of DME or AE;Decreased knowledge of precautions;Pain      OT Treatment/Interventions: Self-care/ADL training;DME and/or AE instruction;Therapeutic activities;Balance training    OT Goals(Current goals can be found in the care plan section) Acute Rehab OT Goals Patient Stated Goal: get better and get home OT Goal Formulation: With patient Time For Goal Achievement: 09/24/18 Potential to Achieve Goals: Fair ADL Goals Pt Will Perform Grooming: with supervision;standing Pt Will Perform Lower Body Bathing: with supervision;sit to/from stand Pt Will Perform Lower Body Dressing: with supervision;sit to/from stand Additional ADL Goal #1: Pt will walk to bathroom with walker and complete toileting on 3:1 over commode with supervision.  OT Frequency: Min 2X/week   Barriers to D/C: Decreased caregiver support  pt lives alone with aid that comes 4 days a week to help around the house.       Co-evaluation              AM-PAC PT "6 Clicks" Daily Activity  Outcome Measure Help from another person eating meals?: None Help from another person taking care of personal grooming?: None Help from another person toileting, which includes using toliet, bedpan, or urinal?: A Lot Help from another person bathing (including washing, rinsing, drying)?: A Lot Help from another person to put on and taking off regular upper body clothing?: A Little Help from another person to put on and taking off regular lower body clothing?: A Lot 6 Click Score: 17   End of Session Equipment Utilized During Treatment: Rolling walker;Oxygen Nurse Communication: Mobility status  Activity Tolerance:  Patient limited by pain Patient left: in chair;with call bell/phone within reach  OT Visit Diagnosis: Unsteadiness on feet (R26.81);Other abnormalities of gait and mobility (R26.89);History of falling (Z91.81);Pain Pain - Right/Left: (both) Pain - part of body: (back)                Time: 3329-5188 OT Time Calculation (min): 23 min Charges:  OT General Charges $OT Visit: 1 Visit OT Evaluation $OT Eval Moderate Complexity: [place], OTR/L [phone]  Glenford Peers 09/10/2018, 12:01 PM

## 2018-09-10 NOTE — Clinical Social Work Note (Signed)
Clinical Social Work Assessment  Patient Details  Name: Patricia Holt MRN: [id number] Date of Birth: 1923/12/15  Date of referral:  09/10/18               Reason for consult:  Discharge Planning                Permission sought to share information with:  Case Manager, Facility Sport and exercise psychologist, Family Supports Permission granted to share information::  Yes, Verbal Permission Granted  Name::     Clinical biochemist::  SNFs  Relationship::  Son  Contact Information:  [phone]  Housing/Transportation Living arrangements for the past 2 months:  [place] of Information:  Patient Patient Interpreter Needed:  None Criminal Activity/Legal Involvement Pertinent to Current Situation/Hospitalization:  No - Comment as needed Significant Relationships:  Adult Children Lives with:  Self Do you feel safe going back to the place where you live?  No Need for family participation in patient care:  Yes (Comment)  Care giving concerns:  CSW received referral for possible SNF placement at time of discharge. Spoke with patient regarding possibility of SNF placement . Patient's  Son and his wife  are currently unable to care for her at their home given patient's current needs and fall risk.  Patient and  son  expressed understanding of PT recommendation and are agreeable to SNF placement at time of discharge. CSW to continue to follow and assist with discharge planning needs.     Social Worker assessment / plan:  Spoke with patient and  son   concerning possibility of rehab at [facility] before returning home.    Employment status:  Retired Forensic scientist:  Medicare PT Recommendations:  [place] / Referral to community resources:  [place]  Patient/Family's Response to care:  Patient and son  recognize need for rehab before returning home and are agreeable to a SNF in [place]. They report preference for [facility], [facility], or [facility] in that order CSW explained insurance authorization process. Patient's family reported that they want patient to get stronger to be able to come back home.    Patient/Family's Understanding of and Emotional Response to Diagnosis, Current Treatment, and Prognosis:  Patient/family is realistic regarding therapy needs and expressed being hopeful for SNF placement. Patient expressed understanding of CSW role and discharge process as well as medical condition. No questions/concerns about plan or treatment.    Emotional Assessment Appearance:  Appears stated age Attitude/Demeanor/Rapport:  Gracious Affect (typically observed):  Accepting, Adaptable Orientation:  Oriented to Self, Oriented to Place, Oriented to  Time, Oriented to Situation Alcohol / Substance use:  Not Applicable Psych involvement (Current and /or in the community):  No (Comment)  Discharge Needs  Concerns to be addressed:  Discharge Planning Concerns Readmission within the last 30 days:  No Current discharge risk:  Dependent with Mobility Barriers to Discharge:  Continued Medical Work up   [employer], LCSW 09/10/2018, 4:03 PM

## 2018-09-10 NOTE — Progress Notes (Signed)
PROGRESS NOTE    Patricia Holt  LNL:[id number] DOB: 10/13/1924 DOA: 09/08/2018 PCP: Lavone Orn, MD  Outpatient Specialists:   Brief Narrative: Patient is a 84 year old female, with multiple medical and cardiac problems.  Patient carries diagnosis of atrial fibrillation, aortic valve disorder, couple tunnel syndrome, hypertension, hyperlipidemia, status post permanent pacemaker placement, S-A node dysfunction amongst other medical and cardiac history.  Patient was admitted with pain around the buttock area/tailbone following a fall at home.  Patient continues to report pain.  PT OT input is highly appreciated.  Likely, patient will need rehab.  09/10/2018: Patient currently report pain.  Was start patient on OxyContin ER 10 mg p.o. twice daily.  We will continue to optimize patient's pain control.  Monitor blood pressure closely.  Will discontinue Lasix.  Gentle hydration, IV fluids normal saline 50 cc/h x 20 hours.  Goal systolic blood pressure for patient's age should be around 135 mmHg.  Assessment & Plan:   Active Problems:   Acute pain  Fall at home with buttock/Tailbone pain: Optimize pain control. PT OT input is appreciated. Likely rehab. Patient is on Coumadin, assess safety of Coumadin if patient continues to fall. Discontinued Lasix. Gentle hydration. A.m. labs.  Chronic A. fib on Coumadin: INR is 4.19 today.   Monitor closely for any signs of bleeding. Pharmacy input is appreciated.   Continue to monitor PT/INR.   Continue metoprolol and diltiazem for rate control  Hypothyroidism: Continue with Synthroid  Hypertension: Continue with metoprolol and diltiazem Goal systolic blood pressure should be around 135 mmHg.  DVT Prophylaxis Coumadin Family Communication: A Code Status DNR  Disposition Plan:  Likely rehab.    Consultants:   None  Procedures:   None  Antimicrobials:   None   Subjective: No new complaints. Patient continues to report  pain around the buttock/tailbone area  Objective: Vitals:   09/10/18 0525 09/10/18 0941 09/10/18 1008 09/10/18 1134  BP: (!) 106/57 120/66 (!) 104/57 124/77  Pulse: 72  98 97  Resp:      Temp: 97.6 F (36.4 C)  97.9 F (36.6 C)   TempSrc: Oral  Oral   SpO2: 98%     Weight:      Height:        Intake/Output Summary (Last 24 hours) at 09/10/2018 1148 Last data filed at 09/10/2018 0531 Gross per 24 hour  Intake -  Output 675 ml  Net -675 ml   Filed Weights   09/08/18 1147 09/08/18 1823  Weight: 66.2 kg 66.7 kg    Examination:  General exam: Appears calm and comfortable  Respiratory system: Clear to auscultation. Cardiovascular system: S1 & S2 heard.  No pedal edema. Gastrointestinal system: Abdomen is nondistended, soft and nontender. No organomegaly or masses felt. Normal bowel sounds heard. Central nervous system: Alert and oriented. No focal neurological deficits. Extremities: No leg edema.    Data Reviewed: I have personally reviewed following labs and imaging studies  CBC: Recent Labs  Lab 09/08/18 1151 09/08/18 1218  WBC 9.6  --   NEUTROABS 7.8*  --   HGB 14.0 15.6*  HCT 45.3 46.0  MCV 93.2  --   PLT 388  --    Basic Metabolic Panel: Recent Labs  Lab 09/08/18 1151 09/08/18 1218  NA 137 138  K 4.4 4.4  CL 100 98  CO2 30  --   GLUCOSE 108* 107*  BUN 14 18  CREATININE 0.77 0.80  CALCIUM 9.8  --    GFR: Estimated  Creatinine Clearance: 43.6 mL/min (by C-G formula based on SCr of 0.8 mg/dL). Liver Function Tests: No results for input(s): AST, ALT, ALKPHOS, BILITOT, PROT, ALBUMIN in the last 168 hours. No results for input(s): LIPASE, AMYLASE in the last 168 hours. No results for input(s): AMMONIA in the last 168 hours. Coagulation Profile: Recent Labs  Lab 09/08/18 1151 09/09/18 0334 09/10/18 0307  INR 2.01 3.05 4.19*   Cardiac Enzymes: No results for input(s): CKTOTAL, CKMB, CKMBINDEX, TROPONINI in the last 168 hours. BNP (last 3  results) No results for input(s): PROBNP in the last 8760 hours. HbA1C: No results for input(s): HGBA1C in the last 72 hours. CBG: No results for input(s): GLUCAP in the last 168 hours. Lipid Profile: No results for input(s): CHOL, HDL, LDLCALC, TRIG, CHOLHDL, LDLDIRECT in the last 72 hours. Thyroid Function Tests: No results for input(s): TSH, T4TOTAL, FREET4, T3FREE, THYROIDAB in the last 72 hours. Anemia Panel: No results for input(s): VITAMINB12, FOLATE, FERRITIN, TIBC, IRON, RETICCTPCT in the last 72 hours. Urine analysis:    Component Value Date/Time   COLORURINE YELLOW 12/03/2014 1408   APPEARANCEUR CLEAR 12/03/2014 1408   LABSPEC 1.010 12/03/2014 1408   PHURINE 7.5 12/03/2014 1408   GLUCOSEU NEGATIVE 12/03/2014 1408   HGBUR NEGATIVE 12/03/2014 1408   BILIRUBINUR NEGATIVE 12/03/2014 1408   KETONESUR NEGATIVE 12/03/2014 1408   PROTEINUR NEGATIVE 12/03/2014 1408   UROBILINOGEN 1.0 12/03/2014 1408   NITRITE NEGATIVE 12/03/2014 1408   LEUKOCYTESUR NEGATIVE 12/03/2014 1408   Sepsis Labs: @LABRCNTIP (procalcitonin:4,lacticidven:4)  )No results found for this or any previous visit (from the past 240 hour(s)).       Radiology Studies: Dg Lumbar Spine Complete  Result Date: 09/08/2018 CLINICAL DATA:  Pt reports that she was bathing this am when left knee gave out and caused pt to fall near the bathroom sink. C/o pain in lower back as well as both hips and left knee. Hx of Total Hip on right side and left femur sx per pt. EXAM: LUMBAR SPINE - COMPLETE 4+ VIEW COMPARISON:  Lumbar MRI, 07/13/2008 FINDINGS: Upper lumbar spine is not well visualized radiographically due to the extensive bone demineralization. Apparent compression fractures from the lower thoracic spine through L2. Grade 1 anterolisthesis of L4 on L5. Mild dextroscoliosis, apex at L3. Loss of disc height throughout the visualized spine, greatest at L4-L5 and L5-S1, moderate to severe. There is a dense rounded  calcification in the left upper quadrant consistent with a peripherally calcified splenic artery aneurysm. IMPRESSION: 1. Limited exam due to extensive bony demineralization. 2. There is evidence suggesting compression fractures from the lower thoracic spine through L2. Several of these appear new from the prior, 2009, MRI, but are of unclear chronicity. 3. There are degenerative changes most evident in the lower lumbar spine. 4. Apparent splenic artery aneurysm, with dense peripheral calcification. Electronically Signed   By: Lajean Manes M.D.   On: 09/08/2018 14:07   Ct Head Wo Contrast  Result Date: 09/08/2018 CLINICAL DATA:  Fall today.  Patient injured the back of her head. EXAM: CT HEAD WITHOUT CONTRAST TECHNIQUE: Contiguous axial images were obtained from the base of the skull through the vertex without intravenous contrast. COMPARISON:  07/21/2004 FINDINGS: Brain: No evidence of acute infarction, hemorrhage, hydrocephalus, extra-axial collection or mass lesion/mass effect. There is ventricular sulcal enlargement reflecting age-appropriate volume loss. Vascular: No hyperdense vessel or unexpected calcification. Skull: Normal. Negative for fracture or focal lesion. Sinuses/Orbits: Globes and orbits are unremarkable. Minor ethmoid sinus mucosal thickening. Remaining sinuses  and mastoid air cells are clear. Other: None. IMPRESSION: 1. No acute intracranial abnormalities. 2. Age-appropriate volume loss. 3. No skull fracture. Electronically Signed   By: Lajean Manes M.D.   On: 09/08/2018 16:06   Ct Lumbar Spine Wo Contrast  Result Date: 09/08/2018 CLINICAL DATA:  Fall striking the back of the head.  Low back pain. EXAM: CT LUMBAR SPINE WITHOUT CONTRAST TECHNIQUE: Multidetector CT imaging of the lumbar spine was performed without intravenous contrast administration. Multiplanar CT image reconstructions were also generated. COMPARISON:  Radiographs from 09/08/2018 FINDINGS: Segmentation: The lowest lumbar  type non-rib-bearing vertebra is labeled as L5. Alignment: 6 mm retrolisthesis at L1-2, no change from 2009. 8 mm anterolisthesis at L4-5, not changed from 2009. 3 mm anterolisthesis of L5 on S1, likewise unchanged. Vertebrae: 40% anterior wedging at L1, not appreciably changed from 2009. No new lumbar fracture. Bony demineralization is present. Paraspinal and other soft tissues: Left splenic artery aneurysm, rim calcified, 2.6 by 3.1 cm, roughly similar to 07/13/2008. Aortoiliac atherosclerotic vascular disease. Edema in the abdominal mesentery, nonspecific. Dense calcification on the lowest images centrally in the pelvis, this is probably from a calcified fibroid but we only barely include the edge of what ever processes causing this appearance. Disc levels: T12-L1: Chronic endplate irregularities, suspected mild right foraminal impingement due to intervertebral spurring. L1-2: At least moderate bilateral foraminal impingement due to facet and uncinate spurring. L2-3: No appreciable impingement. L3-4: No appreciable impingement.  Bilateral facet arthropathy. L4-5: No appreciable impingement. Bilateral facet arthropathy and anterolisthesis. L5-S1: Mild right foraminal stenosis due to intervertebral spurring. Bilateral facet spurring. IMPRESSION: 1. Chronic 40% anterior wedge compression fracture at L1, not changed from 2009. Please note that today's lumbar spine MRI did not include the thoracic spine. 2. Chronic degenerative retrolisthesis at several levels in the lumbar spine. In conjunction with spondylosis there appears to be moderate impingement at L1-2, and mild impingement at T12-L1 and L5-S1 as noted above. 3. 3.1 cm left splenic artery aneurysm, roughly similar in size to the 07/13/2008 exam. 4.  Aortic Atherosclerosis (ICD10-I70.0). 5. Nonspecific stranding in the abdominal mesentery. 6. Suspected uterine fibroid. Electronically Signed   By: Van Clines M.D.   On: 09/08/2018 16:09   Dg Hips Bilat  With Pelvis Min 5 Views  Result Date: 09/08/2018 CLINICAL DATA:  Pt reports that she was bathing this am when left knee gave out and caused pt to fall near the bathroom sink. C/o pain in lower back as well as both hips and left knee. Hx of Total Hip on right side and left femur sx per pt. EXAM: DG HIP (WITH OR WITHOUT PELVIS) 5+V BILAT COMPARISON:  None. FINDINGS: No acute fracture. Right hip total arthroplasty is well-seated and aligned. Left hip compression screw and intramedullary rod appear well seated with no evidence of loosening. Left hip joint, SI joints and symphysis pubis are normally spaced and aligned. Bones are extensively demineralized. Soft tissues are unremarkable. IMPRESSION: 1. No fracture, dislocation or acute finding. No evidence of loosening of the orthopedic hardware. Electronically Signed   By: Lajean Manes M.D.   On: 09/08/2018 14:03        Scheduled Meds: . bisacodyl  10 mg Oral Once  . diltiazem  180 mg Oral Daily  . docusate sodium  100 mg Oral BID  . fluticasone  2 spray Each Nare Daily  . furosemide  20 mg Oral Daily  . levothyroxine  25 mcg Oral QAC breakfast  . megestrol  400 mg  Oral BID  . metoprolol succinate  100 mg Oral Daily  . oxyCODONE  10 mg Oral Q12H  . polyethylene glycol  17 g Oral Daily  . sodium chloride flush  3 mL Intravenous Q12H  . Warfarin - Pharmacist Dosing Inpatient   Does not apply q1800   Continuous Infusions: . sodium chloride       LOS: 2 days    Time spent: 25 minutes.    Dana Allan, MD  [facility] Pager #: [phone] 7PM-7AM contact night coverage as above

## 2018-09-11 ENCOUNTER — Encounter (HOSPITAL_COMMUNITY): Payer: Self-pay | Admitting: General Practice

## 2018-09-11 DIAGNOSIS — R52 Pain, unspecified: Secondary | ICD-10-CM

## 2018-09-11 LAB — CBC WITH DIFFERENTIAL/PLATELET
Abs Immature Granulocytes: 0.04 10*3/uL (ref 0.00–0.07)
Basophils Absolute: 0 10*3/uL (ref 0.0–0.1)
Basophils Relative: 0 %
Eosinophils Absolute: 0.4 10*3/uL (ref 0.0–0.5)
Eosinophils Relative: 5 %
HCT: 39.5 % (ref 36.0–46.0)
Hemoglobin: 12 g/dL (ref 12.0–15.0)
Immature Granulocytes: 1 %
Lymphocytes Relative: 9 %
Lymphs Abs: 0.7 10*3/uL (ref 0.7–4.0)
MCH: 28 pg (ref 26.0–34.0)
MCHC: 30.4 g/dL (ref 30.0–36.0)
MCV: 92.1 fL (ref 80.0–100.0)
Monocytes Absolute: 0.9 10*3/uL (ref 0.1–1.0)
Monocytes Relative: 12 %
Neutro Abs: 5.9 10*3/uL (ref 1.7–7.7)
Neutrophils Relative %: 73 %
Platelets: 269 10*3/uL (ref 150–400)
RBC: 4.29 MIL/uL (ref 3.87–5.11)
RDW: 14.4 % (ref 11.5–15.5)
WBC: 8 10*3/uL (ref 4.0–10.5)
nRBC: 0 % (ref 0.0–0.2)

## 2018-09-11 LAB — RENAL FUNCTION PANEL
Albumin: 2.9 g/dL — ABNORMAL LOW (ref 3.5–5.0)
Anion gap: 6 (ref 5–15)
BUN: 14 mg/dL (ref 8–23)
CO2: 30 mmol/L (ref 22–32)
Calcium: 9 mg/dL (ref 8.9–10.3)
Chloride: 95 mmol/L — ABNORMAL LOW (ref 98–111)
Creatinine, Ser: 0.86 mg/dL (ref 0.44–1.00)
GFR calc Af Amer: >60 mL/min (ref >60)
GFR calc non Af Amer: 57 mL/min — ABNORMAL LOW (ref >60)
Glucose, Bld: 102 mg/dL — ABNORMAL HIGH (ref 70–99)
Phosphorus: 3 mg/dL (ref 2.5–4.6)
Potassium: 4.6 mmol/L (ref 3.5–5.1)
Sodium: 131 mmol/L — ABNORMAL LOW (ref 135–145)

## 2018-09-11 LAB — PROTIME-INR
INR: >10
INR: 3.58
Prothrombin Time: >90 seconds — ABNORMAL HIGH (ref 11.4–15.2)
Prothrombin Time: 35.2 seconds — ABNORMAL HIGH (ref 11.4–15.2)

## 2018-09-11 MED ORDER — GUAIFENESIN ER 600 MG PO TB12
600.0000 mg | ORAL_TABLET | Freq: Two times a day (BID) | ORAL | Status: DC | PRN
  Administered 2018-09-11 – 2018-09-12 (×2): 600 mg via ORAL
  Filled 2018-09-11 (×2): qty 1

## 2018-09-11 MED ORDER — ENSURE ENLIVE PO LIQD
237.0000 mL | Freq: Two times a day (BID) | ORAL | Status: DC
  Administered 2018-09-12: 237 mL via ORAL

## 2018-09-11 MED ORDER — HYDROCODONE-ACETAMINOPHEN 5-325 MG PO TABS: 1.0000 | ORAL_TABLET | ORAL | 0 refills | Status: DC | PRN

## 2018-09-11 MED ORDER — OXYCODONE HCL ER 10 MG PO T12A: 10.0000 mg | EXTENDED_RELEASE_TABLET | Freq: Two times a day (BID) | ORAL | 0 refills | Status: AC

## 2018-09-11 NOTE — Discharge Summary (Addendum)
Physician Discharge Summary  Patricia Holt NWG:[id number] DOB: September 02, 1924 DOA: 09/08/2018  PCP: Lavone Orn, MD  Admit date: 09/08/2018 Discharge date: 09/11/2018  Admitted From:Home Disposition:  SNF  Addendum 09/12/2018: Patient did have bed at skilled nursing facility for discharge however it was too late to discharge patient.  She is medically stable and ready for discharge today.  Recommendations for Outpatient Follow-up:  1. Follow up with PCP in 1-2 weeks 2. Please obtain BMP/CBC in one week 3. Please check your INR before starting warfarin.  Home Health: No Equipment/Devices:None  Discharge Condition: stable CODE STATUS: DNR Diet recommendation: Heart Healthy    Brief/Interim Summary:  #) Low back pain/inability to ambulate: Patient was admitted after fall and inability to ambulate due to pain.  CT scan imaging was unremarkable.  Patient could not get an MRI due to permanent pacemaker.  Patient was very by physical therapy who recommended skilled nursing facility placement.  Patient was started on scheduled OxyContin 10 mg every 12 hours with good control of her pain as well as PRN Norco and cyclamens of pain.  #) Chronic atrial fibrillation/tachybradycardia syndrome status post permanent pacemaker: Patient was continued on home diltiazem and metoprolol.  Her INR was supratherapeutic and her warfarin was held.  It should continue to be held until her INR falls below 3 and then restarted.  #) Hypothyroid is him: Patient was continued on home levothyroxine.  #) Hypertension: Patient was continued on home calcium channel blocker and beta-blocker  Discharge Diagnoses:  Active Problems:   Acute pain    Discharge Instructions   Allergies as of 09/11/2018   No Known Allergies     Medication List    STOP taking these medications   acetaminophen 650 MG CR tablet Commonly known as:  TYLENOL   furosemide 20 MG tablet Commonly known as:  LASIX     TAKE these  medications   azelastine 0.1 % nasal spray Commonly known as:  ASTELIN Place 2 sprays into both nostrils 2 (two) times daily.   BENGAY EX Apply 1 application topically 2 (two) times daily as needed (leg pain).   CALCIUM PO Take 500 mg by mouth 2 (two) times daily.   cyclobenzaprine 5 MG tablet Commonly known as:  FLEXERIL Take 1 tablet (5 mg total) by mouth 3 (three) times daily as needed for muscle spasms.   DILT-XR 180 MG 24 hr capsule Generic drug:  diltiazem Take 180 mg by mouth daily.   docusate sodium 100 MG capsule Commonly known as:  COLACE Take 100 mg by mouth 2 (two) times daily.   HYDROcodone-acetaminophen 5-325 MG tablet Commonly known as:  NORCO/VICODIN Take 1-2 tablets by mouth every 4 (four) hours as needed for moderate pain.   levothyroxine 25 MCG tablet Commonly known as:  SYNTHROID, LEVOTHROID TAKE 1 TABLET BY MOUTH DAILY What changed:  when to take this   metoprolol succinate 100 MG 24 hr tablet Commonly known as:  TOPROL-XL TAKE 1 TABLET BY MOUTH EVERY DAY WITH OR IMMEDIATELY FOLLOWING A MEAL What changed:    how much to take  how to take this  when to take this  additional instructions   OVER THE COUNTER MEDICATION Take by mouth daily as needed (nasal drip). Tylenol Cold - Mucous Severe (dextromethorphan, acetaminophen, phenylephrine, guaifenesin) - takes early morning if she wakes up with a nasal drip (1 swig)   oxyCODONE 10 mg 12 hr tablet Commonly known as:  OXYCONTIN Take 1 tablet (10 mg total) by mouth  every 12 (twelve) hours.   oxyCODONE-acetaminophen 5-325 MG tablet Commonly known as:  PERCOCET/ROXICET Take 1 tablet by mouth every 6 (six) hours as needed for severe pain.   potassium chloride 10 MEQ tablet Commonly known as:  K-DUR,KLOR-CON Take 10 mEq by mouth daily.   tiZANidine 2 MG tablet Commonly known as:  ZANAFLEX Take 2 mg by mouth 2 (two) times daily.   VITAMIN D PO Take 1 tablet by mouth daily.   warfarin 4 MG  tablet Commonly known as:  COUMADIN Take as directed. If you are unsure how to take this medication, talk to your nurse or doctor. Original instructions:  USE AS DIRECTED BY COUMADIN CLINIC What changed:  See the new instructions.       Contact information for follow-up providers    Schedule an appointment as soon as possible for a visit  with Lavone Orn, MD.   Specialty:  Internal Medicine Why:  For recheck of your symptoms Contact information: [address] [place] [address] [phone]            Contact information for after-discharge care    Destination    [facility] .   Service:  Skilled Nursing Contact information: [place] [phone]                 No Known Allergies  Consultations:  None   Procedures/Studies: Dg Lumbar Spine Complete  Result Date: 09/08/2018 CLINICAL DATA:  Pt reports that she was bathing this am when left knee gave out and caused pt to fall near the bathroom sink. C/o pain in lower back as well as both hips and left knee. Hx of Total Hip on right side and left femur sx per pt. EXAM: LUMBAR SPINE - COMPLETE 4+ VIEW COMPARISON:  Lumbar MRI, 07/13/2008 FINDINGS: Upper lumbar spine is not well visualized radiographically due to the extensive bone demineralization. Apparent compression fractures from the lower thoracic spine through L2. Grade 1 anterolisthesis of L4 on L5. Mild dextroscoliosis, apex at L3. Loss of disc height throughout the visualized spine, greatest at L4-L5 and L5-S1, moderate to severe. There is a dense rounded calcification in the left upper quadrant consistent with a peripherally calcified splenic artery aneurysm. IMPRESSION: 1. Limited exam due to extensive bony demineralization. 2. There is evidence suggesting compression fractures from the lower thoracic spine through L2. Several of these appear new from the prior, 2009, MRI, but  are of unclear chronicity. 3. There are degenerative changes most evident in the lower lumbar spine. 4. Apparent splenic artery aneurysm, with dense peripheral calcification. Electronically Signed   By: Lajean Manes M.D.   On: 09/08/2018 14:07   Ct Head Wo Contrast  Result Date: 09/08/2018 CLINICAL DATA:  Fall today.  Patient injured the back of her head. EXAM: CT HEAD WITHOUT CONTRAST TECHNIQUE: Contiguous axial images were obtained from the base of the skull through the vertex without intravenous contrast. COMPARISON:  07/21/2004 FINDINGS: Brain: No evidence of acute infarction, hemorrhage, hydrocephalus, extra-axial collection or mass lesion/mass effect. There is ventricular sulcal enlargement reflecting age-appropriate volume loss. Vascular: No hyperdense vessel or unexpected calcification. Skull: Normal. Negative for fracture or focal lesion. Sinuses/Orbits: Globes and orbits are unremarkable. Minor ethmoid sinus mucosal thickening. Remaining sinuses and mastoid air cells are clear. Other: None. IMPRESSION: 1. No acute intracranial abnormalities. 2. Age-appropriate volume loss. 3. No skull fracture. Electronically Signed   By: Dedra Skeens.D.  On: 09/08/2018 16:06   Ct Lumbar Spine Wo Contrast  Result Date: 09/08/2018 CLINICAL DATA:  Fall striking the back of the head.  Low back pain. EXAM: CT LUMBAR SPINE WITHOUT CONTRAST TECHNIQUE: Multidetector CT imaging of the lumbar spine was performed without intravenous contrast administration. Multiplanar CT image reconstructions were also generated. COMPARISON:  Radiographs from 09/08/2018 FINDINGS: Segmentation: The lowest lumbar type non-rib-bearing vertebra is labeled as L5. Alignment: 6 mm retrolisthesis at L1-2, no change from 2009. 8 mm anterolisthesis at L4-5, not changed from 2009. 3 mm anterolisthesis of L5 on S1, likewise unchanged. Vertebrae: 40% anterior wedging at L1, not appreciably changed from 2009. No new lumbar fracture. Bony  demineralization is present. Paraspinal and other soft tissues: Left splenic artery aneurysm, rim calcified, 2.6 by 3.1 cm, roughly similar to 07/13/2008. Aortoiliac atherosclerotic vascular disease. Edema in the abdominal mesentery, nonspecific. Dense calcification on the lowest images centrally in the pelvis, this is probably from a calcified fibroid but we only barely include the edge of what ever processes causing this appearance. Disc levels: T12-L1: Chronic endplate irregularities, suspected mild right foraminal impingement due to intervertebral spurring. L1-2: At least moderate bilateral foraminal impingement due to facet and uncinate spurring. L2-3: No appreciable impingement. L3-4: No appreciable impingement.  Bilateral facet arthropathy. L4-5: No appreciable impingement. Bilateral facet arthropathy and anterolisthesis. L5-S1: Mild right foraminal stenosis due to intervertebral spurring. Bilateral facet spurring. IMPRESSION: 1. Chronic 40% anterior wedge compression fracture at L1, not changed from 2009. Please note that today's lumbar spine MRI did not include the thoracic spine. 2. Chronic degenerative retrolisthesis at several levels in the lumbar spine. In conjunction with spondylosis there appears to be moderate impingement at L1-2, and mild impingement at T12-L1 and L5-S1 as noted above. 3. 3.1 cm left splenic artery aneurysm, roughly similar in size to the 07/13/2008 exam. 4.  Aortic Atherosclerosis (ICD10-I70.0). 5. Nonspecific stranding in the abdominal mesentery. 6. Suspected uterine fibroid. Electronically Signed   By: Van Clines M.D.   On: 09/08/2018 16:09   Dg Hips Bilat With Pelvis Min 5 Views  Result Date: 09/08/2018 CLINICAL DATA:  Pt reports that she was bathing this am when left knee gave out and caused pt to fall near the bathroom sink. C/o pain in lower back as well as both hips and left knee. Hx of Total Hip on right side and left femur sx per pt. EXAM: DG HIP (WITH OR  WITHOUT PELVIS) 5+V BILAT COMPARISON:  None. FINDINGS: No acute fracture. Right hip total arthroplasty is well-seated and aligned. Left hip compression screw and intramedullary rod appear well seated with no evidence of loosening. Left hip joint, SI joints and symphysis pubis are normally spaced and aligned. Bones are extensively demineralized. Soft tissues are unremarkable. IMPRESSION: 1. No fracture, dislocation or acute finding. No evidence of loosening of the orthopedic hardware. Electronically Signed   By: Lajean Manes M.D.   On: 09/08/2018 14:03       Subjective:   Discharge Exam: Vitals:   09/10/18 2124 09/11/18 0432  BP: 129/63 (!) 111/49  Pulse: 90 74  Resp: 16 18  Temp: 98.1 F (36.7 C) 98.3 F (36.8 C)  SpO2: 100% 98%   Vitals:   09/10/18 1008 09/10/18 1134 09/10/18 2124 09/11/18 0432  BP: (!) 104/57 124/77 129/63 (!) 111/49  Pulse: 98 97 90 74  Resp:   16 18  Temp: 97.9 F (36.6 C)  98.1 F (36.7 C) 98.3 F (36.8 C)  TempSrc: Oral  Oral  SpO2:   100% 98%  Weight:      Height:       General exam: Appears calm and comfortable  Respiratory system: Clear to auscultation. Respiratory effort normal. Cardiovascular system: Irregularly irregular, no murmurs Gastrointestinal system: Soft, nondistended, no rebound or guarding, plus bowel sounds. Central nervous system: Alert and oriented.  Grossly intact, moving all extremities. Extremities: No lower extremity edema Skin: No rashes over visible skin Psychiatry: Judgement and insight appear normal. Mood & affect appropriate   The results of significant diagnostics from this hospitalization (including imaging, microbiology, ancillary and laboratory) are listed below for reference.     Microbiology: No results found for this or any previous visit (from the past 240 hour(s)).   Labs: BNP (last 3 results) No results for input(s): BNP in the last 8760 hours. Basic Metabolic Panel: Recent Labs  Lab 09/08/18 1151  09/08/18 1218 09/11/18 0238  NA 137 138 131*  K 4.4 4.4 4.6  CL 100 98 95*  CO2 30  --  30  GLUCOSE 108* 107* 102*  BUN 14 18 14   CREATININE 0.77 0.80 0.86  CALCIUM 9.8  --  9.0  PHOS  --   --  3.0   Liver Function Tests: Recent Labs  Lab 09/11/18 0238  ALBUMIN 2.9*   No results for input(s): LIPASE, AMYLASE in the last 168 hours. No results for input(s): AMMONIA in the last 168 hours. CBC: Recent Labs  Lab 09/08/18 1151 09/08/18 1218 09/11/18 0237  WBC 9.6  --  8.0  NEUTROABS 7.8*  --  5.9  HGB 14.0 15.6* 12.0  HCT 45.3 46.0 39.5  MCV 93.2  --  92.1  PLT 388  --  269   Cardiac Enzymes: No results for input(s): CKTOTAL, CKMB, CKMBINDEX, TROPONINI in the last 168 hours. BNP: Invalid input(s): POCBNP CBG: No results for input(s): GLUCAP in the last 168 hours. D-Dimer No results for input(s): DDIMER in the last 72 hours. Hgb A1c No results for input(s): HGBA1C in the last 72 hours. Lipid Profile No results for input(s): CHOL, HDL, LDLCALC, TRIG, CHOLHDL, LDLDIRECT in the last 72 hours. Thyroid function studies No results for input(s): TSH, T4TOTAL, T3FREE, THYROIDAB in the last 72 hours.  Invalid input(s): FREET3 Anemia work up No results for input(s): VITAMINB12, FOLATE, FERRITIN, TIBC, IRON, RETICCTPCT in the last 72 hours. Urinalysis    Component Value Date/Time   COLORURINE YELLOW 12/03/2014 1408   APPEARANCEUR CLEAR 12/03/2014 1408   LABSPEC 1.010 12/03/2014 1408   PHURINE 7.5 12/03/2014 1408   GLUCOSEU NEGATIVE 12/03/2014 1408   HGBUR NEGATIVE 12/03/2014 1408   BILIRUBINUR NEGATIVE 12/03/2014 1408   KETONESUR NEGATIVE 12/03/2014 1408   PROTEINUR NEGATIVE 12/03/2014 1408   UROBILINOGEN 1.0 12/03/2014 1408   NITRITE NEGATIVE 12/03/2014 1408   LEUKOCYTESUR NEGATIVE 12/03/2014 1408   Sepsis Labs Invalid input(s): PROCALCITONIN,  WBC,  LACTICIDVEN Microbiology No results found for this or any previous visit (from the past 240 hour(s)).   Time  coordinating discharge: 35  SIGNED:   Cristy Folks, MD  [facility] 09/11/2018, 4:07 PM  If 7PM-7AM, please contact night-coverage [email or web address] Password [email or web address]

## 2018-09-11 NOTE — Care Management Important Message (Signed)
Important Message  Patient Details  Name: Patricia Holt MRN: [id number] Date of Birth: 08-Dec-1923   Medicare Important Message Given:  Yes    Elton Heid Montine Circle 09/11/2018, 3:31 PM

## 2018-09-11 NOTE — Progress Notes (Signed)
Lab reported critical INR > 10 on call for [facility] notified by text.Pharmacy also notified ,pharmacist is going to have lab redrawn INR

## 2018-09-11 NOTE — Progress Notes (Signed)
PT Cancellation Note  Patient Details Name: KETA VANVALKENBURGH MRN: [id number] DOB: [birth year]   Cancelled Treatment:    Reason Eval/Treat Not Completed: Other (comment)   Checked on pt for mobility session, and she politely declined, wanting to rest;   Will follow,   Roney Marion, [place] Pager [phone] Office [phone]    Colletta [place] 09/11/2018, 3:27 PM

## 2018-09-11 NOTE — Progress Notes (Signed)
ANTICOAGULATION CONSULT NOTE - Follow Up Consult  Pharmacy Consult for Coumadin Indication: atrial fibrillation  No Known Allergies  Patient Measurements: Height: 5\' 7"  (170.2 cm) Weight: 147 lb (66.7 kg) IBW/kg (Calculated) : 61.6  Vital Signs: Temp: 98.3 F (36.8 C) (11/12 0432) Temp Source: Oral (11/12 0432) BP: 111/49 (11/12 0432) Pulse Rate: 74 (11/12 0432)  Labs: Recent Labs    09/08/18 1151 09/08/18 1218  09/10/18 0307 09/11/18 0237 09/11/18 0238 09/11/18 0515  HGB 14.0 15.6*  --   --  12.0  --   --   HCT 45.3 46.0  --   --  39.5  --   --   PLT 388  --   --   --  269  --   --   LABPROT 22.5*  --    < > 39.8* >90.0*  --  35.2*  INR 2.01  --    < > 4.19* >10.00*  --  3.58  CREATININE 0.77 0.80  --   --   --  0.86  --    < > = values in this interval not displayed.    Estimated Creatinine Clearance: 40.6 mL/min (by C-G formula based on SCr of 0.86 mg/dL).   Assessment:   Anticoag: Warfarin for hx afib. CT head negative. Hgb 15.6>12, plts 388>269, no bleeding noted. INR 2.01>3.05>4.19>3.58 today. - PTA 6mg  Mon + Fri, 4mg  all other days - Missed dose 11/9  Goal of Therapy:  INR 2-3 Monitor platelets by anticoagulation protocol: Yes   Plan:  Hold Coumadin again today Daily INR  Ismerai Bin S. Alford Highland, PharmD, BCPS Clinical Staff Pharmacist Eilene Ghazi [place] 09/11/2018,8:07 AM

## 2018-09-11 NOTE — Progress Notes (Signed)
PT Cancellation Note  Patient Details Name: Patricia Holt MRN: [id number] DOB: 1924/02/07   Cancelled Treatment:    Reason Eval/Treat Not Completed: Medical issues which prohibited therapy   Critically high INR;   Will follow,  Roney Marion, PT  Acute Rehabilitation Services Pager [phone] Office [place] 09/11/2018, 10:23 AM

## 2018-09-11 NOTE — Progress Notes (Signed)
PROGRESS NOTE    Patricia Holt  BZJ:[id number] DOB: 06/02/1924 DOA: 09/08/2018 PCP: Lavone Orn, MD   Brief Narrative:  84 year old with past medical history relevant for chronic atrial fibrillation on warfarin, tachybradycardia syndrome status post permanent pacemaker, hypothyroidism, hypertension, admitted after mechanical fall admitted for pain control and pending placement in skilled nursing facility for rehab.   Assessment & Plan:   Active Problems:   Acute pain   #) Low back pain/pain with ambulation: Patient's pain appears to be better controlled currently. -PT recommended skilled nursing facility, pending placement -Continue OxyContin 10 mg every 12 hours -Continue PRN Norco and cyclobenzaprine  #) Is chronic atrial fibrillation/tachybradycardia syndrome status post PPM: -Continue diltiazem 180 mg daily -Continue metoprolol succinate 100 mg daily -Continue warfarin with pharmacy consult, patient is interested in possibly switching to novel oral anticoagulant  #) Hypothyroidism: -Continue levothyroxine 25 mcg  #) Hypertension: -Continue calcium channel blocker and beta-blocker  Fluids: Gentle IV fluids Elect lites: Monitor and supplement Nutrition: Heart healthy diet   Prophylaxis: On warfarin  Disposition: Pending skilled nursing facility placement  DNR     Consultants:   None  Procedures:   None  Antimicrobials:  None   Subjective: This morning patient reports that her pain is fairly well controlled.  She denies any nausea, vomiting, diarrhea, cough, congestion.  We did have an extensive discussion about her anticoagulation and she is possibly interested in transitioning to a novel oral anticoagulant which per review of the chart she is not interested been doing in the past.  Her PCP Dr. Laurann Montana was called and this writer is waiting a call back.  Objective: Vitals:   09/10/18 1008 09/10/18 1134 09/10/18 2124 09/11/18 0432  BP: (!) 104/57  124/77 129/63 (!) 111/49  Pulse: 98 97 90 74  Resp:   16 18  Temp: 97.9 F (36.6 C)  98.1 F (36.7 C) 98.3 F (36.8 C)  TempSrc: Oral   Oral  SpO2:   100% 98%  Weight:      Height:        Intake/Output Summary (Last 24 hours) at 09/11/2018 1000 Last data filed at 09/11/2018 0447 Gross per 24 hour  Intake 1065.35 ml  Output 1800 ml  Net -734.65 ml   Filed Weights   09/08/18 1147 09/08/18 1823  Weight: 66.2 kg 66.7 kg    Examination:  General exam: Appears calm and comfortable  Respiratory system: Clear to auscultation. Respiratory effort normal. Cardiovascular system: Irregularly irregular, no murmurs Gastrointestinal system: Soft, nondistended, no rebound or guarding, plus bowel sounds. Central nervous system: Alert and oriented.  Grossly intact, moving all extremities. Extremities: No lower extremity edema Skin: No rashes over visible skin Psychiatry: Judgement and insight appear normal. Mood & affect appropriate.     Data Reviewed: I have personally reviewed following labs and imaging studies  CBC: Recent Labs  Lab 09/08/18 1151 09/08/18 1218 09/11/18 0237  WBC 9.6  --  8.0  NEUTROABS 7.8*  --  5.9  HGB 14.0 15.6* 12.0  HCT 45.3 46.0 39.5  MCV 93.2  --  92.1  PLT 388  --  017   Basic Metabolic Panel: Recent Labs  Lab 09/08/18 1151 09/08/18 1218 09/11/18 0238  NA 137 138 131*  K 4.4 4.4 4.6  CL 100 98 95*  CO2 30  --  30  GLUCOSE 108* 107* 102*  BUN 14 18 14   CREATININE 0.77 0.80 0.86  CALCIUM 9.8  --  9.0  PHOS  --   --  3.0   GFR: Estimated Creatinine Clearance: 40.6 mL/min (by C-G formula based on SCr of 0.86 mg/dL). Liver Function Tests: Recent Labs  Lab 09/11/18 0238  ALBUMIN 2.9*   No results for input(s): LIPASE, AMYLASE in the last 168 hours. No results for input(s): AMMONIA in the last 168 hours. Coagulation Profile: Recent Labs  Lab 09/08/18 1151 09/09/18 0334 09/10/18 0307 09/11/18 0237 09/11/18 0515  INR 2.01 3.05  4.19* >10.00* 3.58   Cardiac Enzymes: No results for input(s): CKTOTAL, CKMB, CKMBINDEX, TROPONINI in the last 168 hours. BNP (last 3 results) No results for input(s): PROBNP in the last 8760 hours. HbA1C: No results for input(s): HGBA1C in the last 72 hours. CBG: No results for input(s): GLUCAP in the last 168 hours. Lipid Profile: No results for input(s): CHOL, HDL, LDLCALC, TRIG, CHOLHDL, LDLDIRECT in the last 72 hours. Thyroid Function Tests: No results for input(s): TSH, T4TOTAL, FREET4, T3FREE, THYROIDAB in the last 72 hours. Anemia Panel: No results for input(s): VITAMINB12, FOLATE, FERRITIN, TIBC, IRON, RETICCTPCT in the last 72 hours. Sepsis Labs: No results for input(s): PROCALCITON, LATICACIDVEN in the last 168 hours.  No results found for this or any previous visit (from the past 240 hour(s)).       Radiology Studies: No results found.      Scheduled Meds: . diltiazem  180 mg Oral Daily  . docusate sodium  100 mg Oral BID  . fluticasone  2 spray Each Nare Daily  . levothyroxine  25 mcg Oral QAC breakfast  . megestrol  400 mg Oral BID  . metoprolol succinate  100 mg Oral Daily  . oxyCODONE  10 mg Oral Q12H  . polyethylene glycol  17 g Oral Daily  . sodium chloride flush  3 mL Intravenous Q12H  . Warfarin - Pharmacist Dosing Inpatient   Does not apply q1800   Continuous Infusions: . sodium chloride    . sodium chloride 50 mL/hr at 09/11/18 0300     LOS: 3 days    Time spent: [place], MD [facility]  If 7PM-7AM, please contact night-coverage [email or web address] Password [email or web address] 09/11/2018, 10:00 AM

## 2018-09-12 DIAGNOSIS — Z7901 Long term (current) use of anticoagulants: Secondary | ICD-10-CM | POA: Diagnosis not present

## 2018-09-12 DIAGNOSIS — W19XXXD Unspecified fall, subsequent encounter: Secondary | ICD-10-CM | POA: Diagnosis not present

## 2018-09-12 DIAGNOSIS — M549 Dorsalgia, unspecified: Secondary | ICD-10-CM | POA: Diagnosis not present

## 2018-09-12 DIAGNOSIS — K219 Gastro-esophageal reflux disease without esophagitis: Secondary | ICD-10-CM | POA: Diagnosis not present

## 2018-09-12 DIAGNOSIS — M6281 Muscle weakness (generalized): Secondary | ICD-10-CM | POA: Diagnosis not present

## 2018-09-12 DIAGNOSIS — E079 Disorder of thyroid, unspecified: Secondary | ICD-10-CM | POA: Diagnosis not present

## 2018-09-12 DIAGNOSIS — Z5181 Encounter for therapeutic drug level monitoring: Secondary | ICD-10-CM | POA: Diagnosis not present

## 2018-09-12 DIAGNOSIS — R0602 Shortness of breath: Secondary | ICD-10-CM | POA: Diagnosis not present

## 2018-09-12 DIAGNOSIS — J188 Other pneumonia, unspecified organism: Secondary | ICD-10-CM | POA: Diagnosis not present

## 2018-09-12 DIAGNOSIS — M818 Other osteoporosis without current pathological fracture: Secondary | ICD-10-CM | POA: Diagnosis not present

## 2018-09-12 DIAGNOSIS — M810 Age-related osteoporosis without current pathological fracture: Secondary | ICD-10-CM | POA: Diagnosis not present

## 2018-09-12 DIAGNOSIS — M2550 Pain in unspecified joint: Secondary | ICD-10-CM | POA: Diagnosis not present

## 2018-09-12 DIAGNOSIS — I4891 Unspecified atrial fibrillation: Secondary | ICD-10-CM | POA: Diagnosis not present

## 2018-09-12 DIAGNOSIS — R110 Nausea: Secondary | ICD-10-CM | POA: Diagnosis not present

## 2018-09-12 DIAGNOSIS — H3530 Unspecified macular degeneration: Secondary | ICD-10-CM | POA: Diagnosis not present

## 2018-09-12 DIAGNOSIS — E785 Hyperlipidemia, unspecified: Secondary | ICD-10-CM | POA: Diagnosis not present

## 2018-09-12 DIAGNOSIS — M353 Polymyalgia rheumatica: Secondary | ICD-10-CM | POA: Diagnosis not present

## 2018-09-12 DIAGNOSIS — G5600 Carpal tunnel syndrome, unspecified upper limb: Secondary | ICD-10-CM | POA: Diagnosis not present

## 2018-09-12 DIAGNOSIS — R262 Difficulty in walking, not elsewhere classified: Secondary | ICD-10-CM | POA: Diagnosis not present

## 2018-09-12 DIAGNOSIS — R296 Repeated falls: Secondary | ICD-10-CM | POA: Diagnosis not present

## 2018-09-12 DIAGNOSIS — R278 Other lack of coordination: Secondary | ICD-10-CM | POA: Diagnosis not present

## 2018-09-12 DIAGNOSIS — S32000D Wedge compression fracture of unspecified lumbar vertebra, subsequent encounter for fracture with routine healing: Secondary | ICD-10-CM | POA: Diagnosis not present

## 2018-09-12 DIAGNOSIS — M25572 Pain in left ankle and joints of left foot: Secondary | ICD-10-CM | POA: Diagnosis not present

## 2018-09-12 DIAGNOSIS — E440 Moderate protein-calorie malnutrition: Secondary | ICD-10-CM | POA: Diagnosis not present

## 2018-09-12 DIAGNOSIS — I358 Other nonrheumatic aortic valve disorders: Secondary | ICD-10-CM | POA: Diagnosis not present

## 2018-09-12 DIAGNOSIS — R531 Weakness: Secondary | ICD-10-CM | POA: Diagnosis not present

## 2018-09-12 DIAGNOSIS — R2681 Unsteadiness on feet: Secondary | ICD-10-CM | POA: Diagnosis not present

## 2018-09-12 DIAGNOSIS — I10 Essential (primary) hypertension: Secondary | ICD-10-CM | POA: Diagnosis not present

## 2018-09-12 DIAGNOSIS — Z950 Presence of cardiac pacemaker: Secondary | ICD-10-CM | POA: Diagnosis not present

## 2018-09-12 DIAGNOSIS — M1611 Unilateral primary osteoarthritis, right hip: Secondary | ICD-10-CM | POA: Diagnosis not present

## 2018-09-12 DIAGNOSIS — Z7401 Bed confinement status: Secondary | ICD-10-CM | POA: Diagnosis not present

## 2018-09-12 DIAGNOSIS — R52 Pain, unspecified: Secondary | ICD-10-CM | POA: Diagnosis not present

## 2018-09-12 DIAGNOSIS — R109 Unspecified abdominal pain: Secondary | ICD-10-CM | POA: Diagnosis not present

## 2018-09-12 DIAGNOSIS — R2689 Other abnormalities of gait and mobility: Secondary | ICD-10-CM | POA: Diagnosis not present

## 2018-09-12 LAB — CBC
HCT: 40 % (ref 36.0–46.0)
Hemoglobin: 12.2 g/dL (ref 12.0–15.0)
MCH: 28.2 pg (ref 26.0–34.0)
MCHC: 30.5 g/dL (ref 30.0–36.0)
MCV: 92.6 fL (ref 80.0–100.0)
Platelets: 310 10*3/uL (ref 150–400)
RBC: 4.32 MIL/uL (ref 3.87–5.11)
RDW: 14.3 % (ref 11.5–15.5)
WBC: 7.6 10*3/uL (ref 4.0–10.5)
nRBC: 0 % (ref 0.0–0.2)

## 2018-09-12 LAB — BASIC METABOLIC PANEL
Anion gap: 7 (ref 5–15)
BUN: 13 mg/dL (ref 8–23)
CO2: 29 mmol/L (ref 22–32)
Calcium: 9.3 mg/dL (ref 8.9–10.3)
Chloride: 96 mmol/L — ABNORMAL LOW (ref 98–111)
Creatinine, Ser: 0.69 mg/dL (ref 0.44–1.00)
GFR calc Af Amer: >60 mL/min (ref >60)
GFR calc non Af Amer: >60 mL/min (ref >60)
Glucose, Bld: 109 mg/dL — ABNORMAL HIGH (ref 70–99)
Potassium: 4.9 mmol/L (ref 3.5–5.1)
Sodium: 132 mmol/L — ABNORMAL LOW (ref 135–145)

## 2018-09-12 LAB — PROTIME-INR
INR: 2.28
Prothrombin Time: 24.8 seconds — ABNORMAL HIGH (ref 11.4–15.2)

## 2018-09-12 MED ORDER — WARFARIN SODIUM 6 MG PO TABS
6.0000 mg | ORAL_TABLET | Freq: Once | ORAL | Status: DC
  Filled 2018-09-12: qty 1

## 2018-09-12 NOTE — Clinical Social Work Placement (Signed)
   CLINICAL SOCIAL WORK PLACEMENT  NOTE  Date:  09/12/2018  Patient Details  Name: Patricia Holt MRN: [id number] Date of Birth: Jul 26, 1924  Clinical Social Work is seeking post-discharge placement for this patient at the [place] level of care (*CSW will initial, date and re-position this form in  chart as items are completed):  Yes   Patient/family provided with [place] Work Department's list of facilities offering this level of care within the geographic area requested by the patient (or if unable, by the patient's family).  Yes   Patient/family informed of their freedom to choose among providers that offer the needed level of care, that participate in Medicare, Medicaid or managed care program needed by the patient, have an available bed and are willing to accept the patient.  Yes   Patient/family informed of [facility]'s ownership interest in [facility] and [facility], as well as of the fact that they are under no obligation to receive care at these facilities.  PASRR submitted to EDS on       PASRR number received on 09/09/18     Existing PASRR number confirmed on       FL2 transmitted to all facilities in geographic area requested by pt/family on 09/10/18     FL2 transmitted to all facilities within larger geographic area on       Patient informed that his/her managed care company has contracts with or will negotiate with certain facilities, including the following:        Yes   Patient/family informed of bed offers received.  Patient chooses bed at [place], [facility]     Physician recommends and patient chooses bed at      Patient to be transferred to [facility] on 09/12/18.  Patient to be transferred to facility by [facility]     Patient family notified on 09/12/18 of transfer.  Name of family member notified:  Marcello Moores (son)     PHYSICIAN Please sign DNR     Additional Comment:     _______________________________________________ Alberteen Sam, LCSW 09/12/2018, 11:22 AM

## 2018-09-12 NOTE — Progress Notes (Signed)
Patient will DC to: [facility]  Anticipated DC date: 09/12/18 Family notified: Marcello Moores Transport by: Corey Harold  Per MD patient ready for DC to [facility] . RN, patient, patient's family, and facility notified of DC. Discharge Summary sent to facility. RN given number for report [phone]. DC packet on chart. Ambulance transport requested for patient.  CSW signing off.  [place], [place]

## 2018-09-12 NOTE — Progress Notes (Signed)
ANTICOAGULATION CONSULT NOTE - Follow Up Consult  Pharmacy Consult for Coumadin Indication: atrial fibrillation  No Known Allergies  Patient Measurements: Height: 5\' 7"  (170.2 cm) Weight: 147 lb (66.7 kg) IBW/kg (Calculated) : 61.6  Vital Signs: Temp: 97.9 F (36.6 C) (11/13 0336) Temp Source: Oral (11/13 0336) BP: 116/65 (11/13 0336) Pulse Rate: 80 (11/13 0336)  Labs: Recent Labs    09/11/18 0237 09/11/18 0238 09/11/18 0515 09/12/18 0214  HGB 12.0  --   --  12.2  HCT 39.5  --   --  40.0  PLT 269  --   --  310  LABPROT >90.0*  --  35.2* 24.8*  INR >10.00*  --  3.58 2.28  CREATININE  --  0.86  --  0.69    Estimated Creatinine Clearance: 43.6 mL/min (by C-G formula based on SCr of 0.69 mg/dL).  Assessment:  Anticoag: Warfarin for hx afib. CT head negative. Hgb 15.6>12.2 stable, plts 310, no bleeding noted. INR 2.01>3.05>4.19>3.58>2.28 today. - PTA 6mg  Mon + Fri, 4mg  all other days  Goal of Therapy:  INR 2-3 Monitor platelets by anticoagulation protocol: Yes   Plan:  Resumed Coumadin today at 6mg  x 1 Can resume previous home regimen at discharge.   Patricia Holt, PharmD, BCPS Clinical Staff Pharmacist  Patricia Holt 09/12/2018,8:21 AM

## 2018-09-12 NOTE — Progress Notes (Signed)
AVS, printed prescriptions, signed DNR form, and social worker's paperwork placed in discharge packet for [facility]. Report called and given to [facility], RN at [employer]. All questions answered to satisfaction. [facility] to transport pt. Will continue to monitor.

## 2018-09-12 NOTE — Consult Note (Signed)
[facility] CM Primary Care Navigator  09/12/2018  Patricia Holt 1924/05/13 [id number]   Met with patient and son Marcello Moores) at the bedside to identify possible discharge needs.  Patient and son report that she had a "fall at home with left knee giving way" and had inability to ambulate due to pain which had led to this admission.   Patient endorses Dr. Lavone Orn with [facility] at [facility] as her primary care provider.  Patient is using [facility] representative on [employer] to obtain medications without difficulty.  Patient reports that she has been managing her own medications at home with use of "pill box" system filled once a week.  Son or caregiver has been providing transportation to patient's doctors' appointments.  Patient's son verbalized that patient has a private pay companion/ caregiver at home to assist with her needs.    Anticipated discharge plan is skilled nursing facility per therapy recommendation (SNF- in process).   Patient and son voiced understanding to call primary care provider's office when patient returns back home for a post discharge follow-up visit within 1- 2 weeks or sooner if needs arise. Patient letter (with PCP's contact number) was provided as their reminder.   Discussed with patient and son regarding [facility] CM services available for health management and resources at home and both indicated having no current needs for services at this point and has been managing health needs so far with his and caregiver's help.    Patient and son expressed understanding to seek referral from primary care provider to [facility] care management if deemed necessary and appropriate for any services in the future- when patient returns back home.   [facility] care management information was provided for future needs that patient may have.   Her son mentioned that patient has a nurse coming to the home from [place] providing hospice services  and hopes that it will be resumed when she gets back  home.  Primary care provider's office is listed as providing transition of care (TOC) follow-up.   For additional questions please contact:  Edwena Felty A. Jame Seelig, BSN, RN-BC [facility] PRIMARY CARE Navigator Cell: [phone]

## 2018-09-14 DIAGNOSIS — R262 Difficulty in walking, not elsewhere classified: Secondary | ICD-10-CM | POA: Diagnosis not present

## 2018-09-14 DIAGNOSIS — I4891 Unspecified atrial fibrillation: Secondary | ICD-10-CM | POA: Diagnosis not present

## 2018-09-14 DIAGNOSIS — M810 Age-related osteoporosis without current pathological fracture: Secondary | ICD-10-CM | POA: Diagnosis not present

## 2018-09-14 DIAGNOSIS — M549 Dorsalgia, unspecified: Secondary | ICD-10-CM | POA: Diagnosis not present

## 2018-09-24 ENCOUNTER — Telehealth: Payer: Self-pay | Admitting: *Deleted

## 2018-09-24 NOTE — Telephone Encounter (Signed)
Pt due an INR and per discharge summary from hospital pt went to [facility].  Pt in [place] in [place], [place]. Called them and confirmed with Judeen Hammans that the pt's INR was being managed by them and warfarin being dosed by them.

## 2018-10-14 DIAGNOSIS — K5900 Constipation, unspecified: Secondary | ICD-10-CM | POA: Diagnosis not present

## 2018-10-14 DIAGNOSIS — M549 Dorsalgia, unspecified: Secondary | ICD-10-CM | POA: Diagnosis not present

## 2018-10-14 DIAGNOSIS — I119 Hypertensive heart disease without heart failure: Secondary | ICD-10-CM | POA: Diagnosis not present

## 2018-10-14 DIAGNOSIS — D649 Anemia, unspecified: Secondary | ICD-10-CM | POA: Diagnosis not present

## 2018-10-31 DIAGNOSIS — Z79899 Other long term (current) drug therapy: Secondary | ICD-10-CM | POA: Diagnosis not present

## 2018-10-31 DIAGNOSIS — N390 Urinary tract infection, site not specified: Secondary | ICD-10-CM | POA: Diagnosis not present

## 2018-10-31 DIAGNOSIS — R319 Hematuria, unspecified: Secondary | ICD-10-CM | POA: Diagnosis not present

## 2018-11-08 ENCOUNTER — Encounter: Payer: Self-pay | Admitting: Cardiology

## 2018-11-09 ENCOUNTER — Telehealth: Payer: Self-pay | Admitting: Cardiology

## 2018-11-09 NOTE — Telephone Encounter (Signed)
I call to help her with the transmission but no one answered. I left a message with my direct number to call me back.

## 2018-11-09 NOTE — Telephone Encounter (Signed)
New message   Per patient's daughter in law please call her to discuss information about the pacemaker.

## 2018-11-15 DIAGNOSIS — I4891 Unspecified atrial fibrillation: Secondary | ICD-10-CM | POA: Diagnosis not present

## 2018-11-15 DIAGNOSIS — M810 Age-related osteoporosis without current pathological fracture: Secondary | ICD-10-CM | POA: Diagnosis not present

## 2018-11-15 DIAGNOSIS — R5381 Other malaise: Secondary | ICD-10-CM | POA: Diagnosis not present

## 2018-11-15 DIAGNOSIS — E039 Hypothyroidism, unspecified: Secondary | ICD-10-CM | POA: Diagnosis not present

## 2018-11-16 ENCOUNTER — Ambulatory Visit (INDEPENDENT_AMBULATORY_CARE_PROVIDER_SITE_OTHER): Payer: Medicare Other

## 2018-11-16 DIAGNOSIS — I4891 Unspecified atrial fibrillation: Secondary | ICD-10-CM | POA: Diagnosis not present

## 2018-11-17 LAB — CUP PACEART REMOTE DEVICE CHECK
Battery Impedance: 4863 Ohm
Battery Remaining Longevity: 10 mo
Battery Voltage: 2.7 V
Brady Statistic RV Percent Paced: 29 %
Date Time Interrogation Session: 20200117185440
Implantable Lead Implant Date: 20100409
Implantable Lead Implant Date: 20100409
Implantable Lead Location: 753859
Implantable Lead Location: 753860
Implantable Lead Model: 5076
Implantable Lead Model: 5092
Implantable Pulse Generator Implant Date: 20100409
Lead Channel Impedance Value: 632 Ohm
Lead Channel Impedance Value: 67 Ohm
Lead Channel Pacing Threshold Amplitude: 0.875 V
Lead Channel Pacing Threshold Pulse Width: 0.4 ms
Lead Channel Setting Pacing Amplitude: 2 V
Lead Channel Setting Pacing Pulse Width: 0.4 ms
Lead Channel Setting Sensing Sensitivity: 2 mV

## 2018-11-18 DIAGNOSIS — R0902 Hypoxemia: Secondary | ICD-10-CM | POA: Diagnosis not present

## 2018-11-19 NOTE — Progress Notes (Signed)
Remote pacemaker transmission.   

## 2018-11-20 DIAGNOSIS — M6281 Muscle weakness (generalized): Secondary | ICD-10-CM | POA: Diagnosis not present

## 2018-11-20 DIAGNOSIS — R2689 Other abnormalities of gait and mobility: Secondary | ICD-10-CM | POA: Diagnosis not present

## 2018-11-26 DIAGNOSIS — I10 Essential (primary) hypertension: Secondary | ICD-10-CM | POA: Diagnosis not present

## 2018-11-26 DIAGNOSIS — D649 Anemia, unspecified: Secondary | ICD-10-CM | POA: Diagnosis not present

## 2018-11-26 DIAGNOSIS — Z79899 Other long term (current) drug therapy: Secondary | ICD-10-CM | POA: Diagnosis not present

## 2018-11-26 DIAGNOSIS — R0602 Shortness of breath: Secondary | ICD-10-CM | POA: Diagnosis not present

## 2018-11-29 ENCOUNTER — Other Ambulatory Visit: Payer: Self-pay

## 2018-11-29 DIAGNOSIS — I4891 Unspecified atrial fibrillation: Secondary | ICD-10-CM

## 2018-12-18 ENCOUNTER — Ambulatory Visit: Payer: Medicare Other | Admitting: Cardiology

## 2018-12-18 DIAGNOSIS — R079 Chest pain, unspecified: Secondary | ICD-10-CM | POA: Diagnosis not present

## 2018-12-28 DIAGNOSIS — M810 Age-related osteoporosis without current pathological fracture: Secondary | ICD-10-CM | POA: Diagnosis not present

## 2018-12-28 DIAGNOSIS — I4891 Unspecified atrial fibrillation: Secondary | ICD-10-CM | POA: Diagnosis not present

## 2018-12-28 DIAGNOSIS — R5381 Other malaise: Secondary | ICD-10-CM | POA: Diagnosis not present

## 2018-12-28 DIAGNOSIS — D649 Anemia, unspecified: Secondary | ICD-10-CM | POA: Diagnosis not present

## 2019-01-03 DIAGNOSIS — R0602 Shortness of breath: Secondary | ICD-10-CM | POA: Diagnosis not present

## 2019-01-03 DIAGNOSIS — I10 Essential (primary) hypertension: Secondary | ICD-10-CM | POA: Diagnosis not present

## 2019-01-03 DIAGNOSIS — Z79899 Other long term (current) drug therapy: Secondary | ICD-10-CM | POA: Diagnosis not present

## 2019-01-03 DIAGNOSIS — D649 Anemia, unspecified: Secondary | ICD-10-CM | POA: Diagnosis not present

## 2019-01-26 DIAGNOSIS — I119 Hypertensive heart disease without heart failure: Secondary | ICD-10-CM | POA: Diagnosis not present

## 2019-01-26 DIAGNOSIS — D649 Anemia, unspecified: Secondary | ICD-10-CM | POA: Diagnosis not present

## 2019-01-26 DIAGNOSIS — I4891 Unspecified atrial fibrillation: Secondary | ICD-10-CM | POA: Diagnosis not present

## 2019-01-26 DIAGNOSIS — M549 Dorsalgia, unspecified: Secondary | ICD-10-CM | POA: Diagnosis not present

## 2019-02-15 ENCOUNTER — Ambulatory Visit (INDEPENDENT_AMBULATORY_CARE_PROVIDER_SITE_OTHER): Payer: Medicare Other | Admitting: *Deleted

## 2019-02-15 ENCOUNTER — Telehealth: Payer: Self-pay

## 2019-02-15 ENCOUNTER — Other Ambulatory Visit: Payer: Self-pay

## 2019-02-15 DIAGNOSIS — I4891 Unspecified atrial fibrillation: Secondary | ICD-10-CM

## 2019-02-15 DIAGNOSIS — I495 Sick sinus syndrome: Secondary | ICD-10-CM

## 2019-02-15 NOTE — Telephone Encounter (Signed)
Left message for patient to remind of missed remote transmission.  

## 2019-02-19 LAB — CUP PACEART REMOTE DEVICE CHECK
Battery Impedance: 5774 Ohm
Battery Remaining Longevity: 6 mo
Battery Voltage: 2.66 V
Brady Statistic RV Percent Paced: 28 %
Date Time Interrogation Session: 20200420153249
Implantable Lead Implant Date: 20100409
Implantable Lead Implant Date: 20100409
Implantable Lead Location: 753859
Implantable Lead Location: 753860
Implantable Lead Model: 5076
Implantable Lead Model: 5092
Implantable Pulse Generator Implant Date: 20100409
Lead Channel Impedance Value: 617 Ohm
Lead Channel Impedance Value: 67 Ohm
Lead Channel Pacing Threshold Amplitude: 0.75 V
Lead Channel Pacing Threshold Pulse Width: 0.4 ms
Lead Channel Setting Pacing Amplitude: 2 V
Lead Channel Setting Pacing Pulse Width: 0.4 ms
Lead Channel Setting Sensing Sensitivity: 2.8 mV

## 2019-02-20 NOTE — Progress Notes (Signed)
Remote pacemaker transmission.   

## 2019-02-22 DIAGNOSIS — R2689 Other abnormalities of gait and mobility: Secondary | ICD-10-CM | POA: Diagnosis not present

## 2019-02-22 DIAGNOSIS — R262 Difficulty in walking, not elsewhere classified: Secondary | ICD-10-CM | POA: Diagnosis not present

## 2019-02-22 DIAGNOSIS — M1712 Unilateral primary osteoarthritis, left knee: Secondary | ICD-10-CM | POA: Diagnosis not present

## 2019-02-26 DIAGNOSIS — R5381 Other malaise: Secondary | ICD-10-CM | POA: Diagnosis not present

## 2019-02-26 DIAGNOSIS — M810 Age-related osteoporosis without current pathological fracture: Secondary | ICD-10-CM | POA: Diagnosis not present

## 2019-02-26 DIAGNOSIS — E039 Hypothyroidism, unspecified: Secondary | ICD-10-CM | POA: Diagnosis not present

## 2019-02-26 DIAGNOSIS — I119 Hypertensive heart disease without heart failure: Secondary | ICD-10-CM | POA: Diagnosis not present

## 2019-03-28 DIAGNOSIS — M2550 Pain in unspecified joint: Secondary | ICD-10-CM | POA: Diagnosis not present

## 2019-03-28 DIAGNOSIS — I4891 Unspecified atrial fibrillation: Secondary | ICD-10-CM | POA: Diagnosis not present

## 2019-03-28 DIAGNOSIS — D649 Anemia, unspecified: Secondary | ICD-10-CM | POA: Diagnosis not present

## 2019-03-28 DIAGNOSIS — I5032 Chronic diastolic (congestive) heart failure: Secondary | ICD-10-CM | POA: Diagnosis not present

## 2019-03-29 DIAGNOSIS — Z20828 Contact with and (suspected) exposure to other viral communicable diseases: Secondary | ICD-10-CM | POA: Diagnosis not present

## 2019-04-12 DIAGNOSIS — Z20828 Contact with and (suspected) exposure to other viral communicable diseases: Secondary | ICD-10-CM | POA: Diagnosis not present

## 2019-04-16 DIAGNOSIS — Z20828 Contact with and (suspected) exposure to other viral communicable diseases: Secondary | ICD-10-CM | POA: Diagnosis not present

## 2019-04-16 DIAGNOSIS — R05 Cough: Secondary | ICD-10-CM | POA: Diagnosis not present

## 2019-04-17 DIAGNOSIS — R319 Hematuria, unspecified: Secondary | ICD-10-CM | POA: Diagnosis not present

## 2019-04-17 DIAGNOSIS — D649 Anemia, unspecified: Secondary | ICD-10-CM | POA: Diagnosis not present

## 2019-04-17 DIAGNOSIS — I10 Essential (primary) hypertension: Secondary | ICD-10-CM | POA: Diagnosis not present

## 2019-04-17 DIAGNOSIS — N390 Urinary tract infection, site not specified: Secondary | ICD-10-CM | POA: Diagnosis not present

## 2019-04-28 DIAGNOSIS — E441 Mild protein-calorie malnutrition: Secondary | ICD-10-CM | POA: Diagnosis not present

## 2019-04-28 DIAGNOSIS — E039 Hypothyroidism, unspecified: Secondary | ICD-10-CM | POA: Diagnosis not present

## 2019-04-28 DIAGNOSIS — M810 Age-related osteoporosis without current pathological fracture: Secondary | ICD-10-CM | POA: Diagnosis not present

## 2019-04-28 DIAGNOSIS — I4891 Unspecified atrial fibrillation: Secondary | ICD-10-CM | POA: Diagnosis not present

## 2019-05-03 DIAGNOSIS — Z20828 Contact with and (suspected) exposure to other viral communicable diseases: Secondary | ICD-10-CM | POA: Diagnosis not present

## 2019-05-09 DIAGNOSIS — Z20828 Contact with and (suspected) exposure to other viral communicable diseases: Secondary | ICD-10-CM | POA: Diagnosis not present

## 2019-05-16 DIAGNOSIS — Z20828 Contact with and (suspected) exposure to other viral communicable diseases: Secondary | ICD-10-CM | POA: Diagnosis not present

## 2019-05-17 ENCOUNTER — Ambulatory Visit (INDEPENDENT_AMBULATORY_CARE_PROVIDER_SITE_OTHER): Payer: Medicare Other | Admitting: *Deleted

## 2019-05-17 ENCOUNTER — Telehealth: Payer: Self-pay

## 2019-05-17 DIAGNOSIS — I495 Sick sinus syndrome: Secondary | ICD-10-CM

## 2019-05-17 NOTE — Telephone Encounter (Signed)
Left message for patient to remind of missed remote transmission.  

## 2019-05-19 LAB — CUP PACEART REMOTE DEVICE CHECK
Battery Impedance: 7532 Ohm
Battery Remaining Longevity: 1 mo — CL
Battery Voltage: 2.61 V
Brady Statistic RV Percent Paced: 28 %
Date Time Interrogation Session: 20200717180715
Implantable Lead Implant Date: 20100409
Implantable Lead Implant Date: 20100409
Implantable Lead Location: 753859
Implantable Lead Location: 753860
Implantable Lead Model: 5076
Implantable Lead Model: 5092
Implantable Pulse Generator Implant Date: 20100409
Lead Channel Impedance Value: 624 Ohm
Lead Channel Impedance Value: 67 Ohm
Lead Channel Pacing Threshold Amplitude: 0.75 V
Lead Channel Pacing Threshold Pulse Width: 0.4 ms
Lead Channel Setting Pacing Amplitude: 2 V
Lead Channel Setting Pacing Pulse Width: 0.4 ms
Lead Channel Setting Sensing Sensitivity: 4 mV

## 2019-05-20 ENCOUNTER — Telehealth: Payer: Self-pay | Admitting: Student

## 2019-05-20 NOTE — Telephone Encounter (Signed)
Spoke to pt daughter in law, pt is in [place], [phone]. Informed her that pt is nearing ERI, will schedule monthly battery checks. Informed daughter in law that we would call back to schedule an appt w/ Dr. Lovena Le to discuss gen change.

## 2019-05-20 NOTE — Telephone Encounter (Signed)
LMOM on Daughter in laws cell to discuss ERI.    Legrand Como [address] [place], PA-C 05/20/2019 8:32 AM

## 2019-05-20 NOTE — Telephone Encounter (Signed)
ERI < 1 month. Not there yet.

## 2019-05-22 ENCOUNTER — Encounter: Payer: Self-pay | Admitting: Cardiology

## 2019-05-22 NOTE — Progress Notes (Signed)
Remote pacemaker transmission.   

## 2019-05-28 DIAGNOSIS — D519 Vitamin B12 deficiency anemia, unspecified: Secondary | ICD-10-CM | POA: Diagnosis not present

## 2019-05-28 DIAGNOSIS — M549 Dorsalgia, unspecified: Secondary | ICD-10-CM | POA: Diagnosis not present

## 2019-05-28 DIAGNOSIS — I119 Hypertensive heart disease without heart failure: Secondary | ICD-10-CM | POA: Diagnosis not present

## 2019-05-28 DIAGNOSIS — E039 Hypothyroidism, unspecified: Secondary | ICD-10-CM | POA: Diagnosis not present

## 2019-05-28 DIAGNOSIS — Z79899 Other long term (current) drug therapy: Secondary | ICD-10-CM | POA: Diagnosis not present

## 2019-05-28 DIAGNOSIS — D649 Anemia, unspecified: Secondary | ICD-10-CM | POA: Diagnosis not present

## 2019-05-28 DIAGNOSIS — R0602 Shortness of breath: Secondary | ICD-10-CM | POA: Diagnosis not present

## 2019-05-28 DIAGNOSIS — E559 Vitamin D deficiency, unspecified: Secondary | ICD-10-CM | POA: Diagnosis not present

## 2019-05-28 DIAGNOSIS — I509 Heart failure, unspecified: Secondary | ICD-10-CM | POA: Diagnosis not present

## 2019-05-28 DIAGNOSIS — E0590 Thyrotoxicosis, unspecified without thyrotoxic crisis or storm: Secondary | ICD-10-CM | POA: Diagnosis not present

## 2019-05-28 DIAGNOSIS — R5381 Other malaise: Secondary | ICD-10-CM | POA: Diagnosis not present

## 2019-06-05 DIAGNOSIS — Z20828 Contact with and (suspected) exposure to other viral communicable diseases: Secondary | ICD-10-CM | POA: Diagnosis not present

## 2019-06-10 DIAGNOSIS — Z20828 Contact with and (suspected) exposure to other viral communicable diseases: Secondary | ICD-10-CM | POA: Diagnosis not present

## 2019-06-12 DIAGNOSIS — I4891 Unspecified atrial fibrillation: Secondary | ICD-10-CM | POA: Diagnosis not present

## 2019-06-12 DIAGNOSIS — Z7901 Long term (current) use of anticoagulants: Secondary | ICD-10-CM | POA: Diagnosis not present

## 2019-06-17 ENCOUNTER — Ambulatory Visit (INDEPENDENT_AMBULATORY_CARE_PROVIDER_SITE_OTHER): Payer: Medicare Other | Admitting: *Deleted

## 2019-06-17 DIAGNOSIS — I4891 Unspecified atrial fibrillation: Secondary | ICD-10-CM

## 2019-06-17 LAB — CUP PACEART REMOTE DEVICE CHECK
Battery Impedance: 9689 Ohm
Battery Voltage: 2.56 V
Brady Statistic RV Percent Paced: 28 %
Date Time Interrogation Session: 20200817162250
Implantable Lead Implant Date: 20100409
Implantable Lead Implant Date: 20100409
Implantable Lead Location: 753859
Implantable Lead Location: 753860
Implantable Lead Model: 5076
Implantable Lead Model: 5092
Implantable Pulse Generator Implant Date: 20100409
Lead Channel Impedance Value: 664 Ohm
Lead Channel Impedance Value: 67 Ohm
Lead Channel Setting Pacing Amplitude: 2 V
Lead Channel Setting Pacing Pulse Width: 0.4 ms
Lead Channel Setting Sensing Sensitivity: 4 mV

## 2019-06-18 ENCOUNTER — Telehealth: Payer: Self-pay | Admitting: Emergency Medicine

## 2019-06-18 NOTE — Telephone Encounter (Signed)
Patricia Holt contacted DC. Made aware of ERI as of 06/10/19 of patient's device. Aware of 07/30/19 appointment with Dr Lovena Le and informed Tamala Julian RN would contact her if Dr Lovena Le wants to change date of appointment. Wanted to make office aware that every time patient attends appointment the nursing facility places her on a 1 week quarantine after she returns.

## 2019-06-18 NOTE — Telephone Encounter (Signed)
LMOM for Patricia Holt(per DPR) to call office. Pt device reached ERI on 06/10/19. She has appointment 07/30/19 with Dr Lovena Le.

## 2019-06-19 NOTE — Telephone Encounter (Signed)
Spoke with Gwinda Passe (DPR). Confirmed that patient would be able to do a virtual visit from her facility to avoid quarantine. Pt's son typically attends visits with her but can be contacted after the virtual visit with an update since he is not allowed in the facility. Betsy requests that I contact Jackelyn Poling, who is in charge of transport/appointments at [facility] [phone]). Betsy requests an update once appointment is rescheduled.  Spoke with Jackelyn Poling, who requests I call pt's nurse, [name], at [facility] [phone], ext. [phone]). Spoke with [name]. She reports they are able to do video visits utilizing FaceTime through their iPad. [name] requests that someone contact her on Friday to setup this appointment. She will be out of the office tomorrow. Will request that scheduler call back on Friday to setup appointment.

## 2019-06-20 DIAGNOSIS — Z20828 Contact with and (suspected) exposure to other viral communicable diseases: Secondary | ICD-10-CM | POA: Diagnosis not present

## 2019-06-21 NOTE — Telephone Encounter (Addendum)
Spoke with [name]. Rescheduled 9/29 OV to 07/02/19 at 12:30pm--virtual visit with Dr. Lovena Le via [place]. [name] requests that link be emailed to her at [email or web address]. Will forward to [place], RN, and [place] in the event any additional info is needed.  Spoke with Gwinda Passe (DPR). Advised of virtual visit on 9/1. She requests an update regarding plan for gen change after appointment. No additional questions at this time.

## 2019-06-25 DIAGNOSIS — Z20828 Contact with and (suspected) exposure to other viral communicable diseases: Secondary | ICD-10-CM | POA: Diagnosis not present

## 2019-06-25 NOTE — Progress Notes (Signed)
Remote pacemaker transmission.   

## 2019-06-28 DIAGNOSIS — R5381 Other malaise: Secondary | ICD-10-CM | POA: Diagnosis not present

## 2019-06-28 DIAGNOSIS — M810 Age-related osteoporosis without current pathological fracture: Secondary | ICD-10-CM | POA: Diagnosis not present

## 2019-06-28 DIAGNOSIS — K5900 Constipation, unspecified: Secondary | ICD-10-CM | POA: Diagnosis not present

## 2019-06-28 DIAGNOSIS — E039 Hypothyroidism, unspecified: Secondary | ICD-10-CM | POA: Diagnosis not present

## 2019-07-02 ENCOUNTER — Telehealth (INDEPENDENT_AMBULATORY_CARE_PROVIDER_SITE_OTHER): Payer: Medicare Other | Admitting: Internal Medicine

## 2019-07-02 ENCOUNTER — Other Ambulatory Visit: Payer: Self-pay

## 2019-07-02 ENCOUNTER — Telehealth: Payer: Self-pay

## 2019-07-02 DIAGNOSIS — I495 Sick sinus syndrome: Secondary | ICD-10-CM

## 2019-07-02 DIAGNOSIS — Z950 Presence of cardiac pacemaker: Secondary | ICD-10-CM

## 2019-07-02 DIAGNOSIS — I4891 Unspecified atrial fibrillation: Secondary | ICD-10-CM

## 2019-07-02 NOTE — Telephone Encounter (Signed)
Call placed to Pt's son.  Discussed generator change/dates available.  Agreeable with July 11, 2019.  Will call facility.

## 2019-07-02 NOTE — H&P (View-Only) (Signed)
Electrophysiology TeleHealth Note   Due to national recommendations of social distancing due to COVID 19, an audio/video telehealth visit is felt to be most appropriate for this patient at this time.  See MyChart message from today for the patient's consent to telehealth for [facility].   Date:  07/02/2019   ID:  [name], DOB 07/11/1924, MRN [id number]  Location: patient's home  Provider location: [address] [place]  Evaluation Performed: Follow-up visit  PCP:  Lavone Orn, MD  Cardiologist:  No primary care provider on file.  Electrophysiologist:  Dr Lovena Le  Chief Complaint:  "I'm doing ok."  History of Present Illness:    Patricia Holt is a 85 y.o. female who presents via audio/video conferencing for a telehealth visit today. She has a h/o sinus node dysfunction and heart block and is s/p PPM insertion over 10 years ago. She has been found to be at [facility]. She has not had syncope. She lives in a SNF. The patient is otherwise without complaint today.  The patient denies symptoms of fevers, chills, cough, or new SOB worrisome for COVID 19.  Past Medical History:  Diagnosis Date  . Age-related macular degeneration, dry, left eye   . Age-related macular degeneration, wet, right eye ([place])   . Anxiety    "maybe occasionally" (09/11/2018)  . Aortic valve disorder   . Atrial fibrillation (HCC)    paroxysmal, status post successful vessel cardioversion 5/09, skains,transient left ventricular systolic dysfunction, likely secondary to cardioversion/stunning of the myocardium, no evidence significant CAD, echo 7/09 shows normal ejection fraction  . Carpal tunnel syndrome   . Chronic anticoagulation   . DJD (degenerative joint disease)   . Dysrhythmia   . GERD (gastroesophageal reflux disease)    OTC occasionally  . Goiter   . Heart murmur   . History of blood transfusion    "related to femur OR" (09/11/2018)  . HTN (hypertension)   .  Hyperlipidemia    "hx" (09/11/2018)  . Hypothyroidism   . Macular degeneration   . Mitral regurgitation   . Osteoporosis    , severe, hypercalciuria, Boniva since 2005, T10 compression fracture 2009  . Other disorder of calcium metabolism   . Polymyalgia rheumatica ([place])   . Presence of permanent cardiac pacemaker   . Rheumatoid arthritis ([place])    "pretty much all over" (09/11/2018)  . Sinoatrial node dysfunction (HCC)   . Sinus drainage    in the morning, clear drainage  . Skin cancer    "nose and forehead/eyebrow" (09/11/2018)    Past Surgical History:  Procedure Laterality Date  . CARDIAC CATHETERIZATION  2000s  . CARPAL TUNNEL RELEASE Bilateral   . CATARACT EXTRACTION W/ INTRAOCULAR LENS  IMPLANT, BILATERAL Bilateral   . DILATION AND CURETTAGE OF UTERUS  X 3  . FEMUR FRACTURE SURGERY Left   . FRACTURE SURGERY    . 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 JOINT REPLACEMENT    . SKIN CANCER EXCISION     "nose and forehead/eyebrow"  . TONSILLECTOMY AND ADENOIDECTOMY    . TOTAL HIP ARTHROPLASTY Right 12/16/2014   Procedure: RIGHT TOTAL HIP ARTHROPLASTY ANTERIOR APPROACH;  Surgeon: Hessie Dibble, MD;  Location: [place];  Service: Orthopedics;  Laterality: Right;  . TOTAL KNEE ARTHROPLASTY Right     Current Outpatient Medications  Medication Sig Dispense Refill  . azelastine (ASTELIN) 0.1 %  nasal spray Place 2 sprays into both nostrils 2 (two) times daily.  5  . CALCIUM PO Take 500 mg by mouth 2 (two) times daily.    . Cholecalciferol (VITAMIN D PO) Take 1 tablet by mouth daily.    . cyclobenzaprine (FLEXERIL) 5 MG tablet Take 1 tablet (5 mg total) by mouth 3 (three) times daily as needed for muscle spasms. 10 tablet 0  . DILT-XR 180 MG 24 hr capsule Take 180 mg by mouth daily.  10  . docusate sodium (COLACE) 100 MG capsule Take 100 mg by mouth 2 (two) times daily.     Marland Kitchen HYDROcodone-acetaminophen  (NORCO/VICODIN) 5-325 MG tablet Take 1-2 tablets by mouth every 4 (four) hours as needed for moderate pain. 30 tablet 0  . levothyroxine (SYNTHROID, LEVOTHROID) 25 MCG tablet TAKE 1 TABLET BY MOUTH DAILY (Patient taking differently: Take 25 mcg by mouth daily before breakfast. ) 30 tablet 3  . Menthol, Topical Analgesic, (BENGAY EX) Apply 1 application topically 2 (two) times daily as needed (leg pain).    . metoprolol succinate (TOPROL-XL) 100 MG 24 hr tablet TAKE 1 TABLET BY MOUTH EVERY DAY WITH OR IMMEDIATELY FOLLOWING A MEAL 90 tablet 2  . OVER THE COUNTER MEDICATION Take by mouth daily as needed (nasal drip). Tylenol Cold - Mucous Severe (dextromethorphan, acetaminophen, phenylephrine, guaifenesin) - takes early morning if she wakes up with a nasal drip (1 swig)    . potassium chloride (K-DUR,KLOR-CON) 10 MEQ tablet Take 10 mEq by mouth daily.   1  . tiZANidine (ZANAFLEX) 2 MG tablet Take 2 mg by mouth 2 (two) times daily.   5  . warfarin (COUMADIN) 4 MG tablet USE AS DIRECTED BY COUMADIN CLINIC (Patient taking differently: Take 4-6 mg by mouth See admin instructions. Take 1.5 tablets (6 mg totally) by mouth on Mon, Friday; take 1 tablet (4 mg totally) other days during the week.) 40 tablet 3   No current facility-administered medications for this visit.     Allergies:   Patient has no known allergies.   Social History:  The patient  reports that she has never smoked. She has never used smokeless tobacco. She reports current alcohol use. She reports that she does not use drugs.   Family History:  The patient's  family history includes Cancer in her sister; Diabetes in her sister; Hypertension in her father and mother.   ROS:  Please see the history of present illness.   All other systems are personally reviewed and negative.    Exam:    Vital Signs:  None today   Labs/Other Tests and Data Reviewed:    Recent Labs: 09/12/2018: BUN 13; Creatinine, Ser 0.69; Hemoglobin 12.2; Platelets  310; Potassium 4.9; Sodium 132   Wt Readings from Last 3 Encounters:  09/08/18 147 lb (66.7 kg)  06/12/18 150 lb (68 kg)  05/15/18 151 lb (68.5 kg)     Other studies personally reviewed:  Last device remote is reviewed from [facility] PDF dated 8/20 which reveals normal device function, no arrhythmias    ASSESSMENT & PLAN:    1.  Sinus node dysfunction/Heart block - she will undergo PM gen change out in the coming weeks as she is at [facility].  2. Chronic atrial fib - her VR is well controlled. We will follow. 3. HTN - her bp has been controlled previously. She has no symptoms.     COVID 19 screen The patient denies symptoms of COVID 19 at this time.  The  importance of social distancing was discussed today.  Follow-up:  3 months Next remote: 3 months  Current medicines are reviewed at length with the patient today.   The patient does not have concerns regarding her medicines.  The following changes were made today:  none  Labs/ tests ordered today include: none No orders of the defined types were placed in this encounter.    Patient Risk:  after full review of this patients clinical status, I feel that they are at moderate risk at this time.  Today, I have spent 15 minutes with the patient with telehealth technology discussing all of the above .    Signed, Cristopher Peru, MD  07/02/2019 12:40 PM     [facility] [address] [place] [address] [phone] (office) [phone] (fax)

## 2019-07-02 NOTE — Telephone Encounter (Signed)
Call placed to [facility].   Advised appt would be via cell phone.  Received [name]'s # for appt [phone]

## 2019-07-02 NOTE — Telephone Encounter (Signed)
Call placed to [facility].  Scheduled Pt for gen change on 07/18/2019 at 11:30 am.  Pt will arrive at 8:30 am on day of procedure for RAPID covid test and labs d/t Pt living in a SNF.  PER DR. Hansel Feinstein COVID TEST ON ARRIVAL FOR PROCEDURE D/T PATIENT LIVES IN SNF.  Instruction given to [name] with [facility]: Arrive [facility] at 8:30 am on 07/18/2019 No breakfast Hold warfarin x 2 days All AM meds ok  Use antibacterial soap on chest prior to arrival.  Son to meet Pt at main entrance and will accompany to Admitting.  Transportation scheduled.

## 2019-07-02 NOTE — Progress Notes (Signed)
   Electrophysiology TeleHealth Note   Due to national recommendations of social distancing due to COVID 19, an audio/video telehealth visit is felt to be most appropriate for this patient at this time.  See MyChart message from today for the patient's consent to telehealth for [facility].   Date:  07/02/2019   ID:  Patricia Holt, DOB 07/26/1924, MRN [id number]  Location: patient's home  Provider location: [address], [place]  Evaluation Performed: Follow-up visit  PCP:  Griffin, John, MD  Cardiologist:  No primary care provider on file.  Electrophysiologist:  Dr Taylor  Chief Complaint:  "I'm doing ok."  History of Present Illness:    Patricia Holt is a 85 y.o. female who presents via audio/video conferencing for a telehealth visit today. She has a h/o sinus node dysfunction and heart block and is s/p PPM insertion over 10 years ago. She has been found to be at [facility]. She has not had syncope. She lives in a SNF. The patient is otherwise without complaint today.  The patient denies symptoms of fevers, chills, cough, or new SOB worrisome for COVID 19.  Past Medical History:  Diagnosis Date  . Age-related macular degeneration, dry, left eye   . Age-related macular degeneration, wet, right eye (HCC)   . Anxiety    "maybe occasionally" (09/11/2018)  . Aortic valve disorder   . Atrial fibrillation (HCC)    paroxysmal, status post successful vessel cardioversion 5/09, skains,transient left ventricular systolic dysfunction, likely secondary to cardioversion/stunning of the myocardium, no evidence significant CAD, echo 7/09 shows normal ejection fraction  . Carpal tunnel syndrome   . Chronic anticoagulation   . DJD (degenerative joint disease)   . Dysrhythmia   . GERD (gastroesophageal reflux disease)    OTC occasionally  . Goiter   . Heart murmur   . History of blood transfusion    "related to femur OR" (09/11/2018)  . HTN (hypertension)   .  Hyperlipidemia    "hx" (09/11/2018)  . Hypothyroidism   . Macular degeneration   . Mitral regurgitation   . Osteoporosis    , severe, hypercalciuria, [place] since 2005, T10 compression fracture 2009  . Other disorder of calcium metabolism   . Polymyalgia rheumatica (HCC)   . Presence of permanent cardiac pacemaker   . Rheumatoid arthritis (HCC)    "pretty much all over" (09/11/2018)  . Sinoatrial node dysfunction (HCC)   . Sinus drainage    in the morning, clear drainage  . Skin cancer    "nose and forehead/eyebrow" (09/11/2018)    Past Surgical History:  Procedure Laterality Date  . CARDIAC CATHETERIZATION  2000s  . CARPAL TUNNEL RELEASE Bilateral   . CATARACT EXTRACTION W/ INTRAOCULAR LENS  IMPLANT, BILATERAL Bilateral   . DILATION AND CURETTAGE OF UTERUS  X 3  . FEMUR FRACTURE SURGERY Left   . FRACTURE SURGERY    . 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 JOINT REPLACEMENT    . SKIN CANCER EXCISION     "nose and forehead/eyebrow"  . TONSILLECTOMY AND ADENOIDECTOMY    . TOTAL HIP ARTHROPLASTY Right 12/16/2014   Procedure: RIGHT TOTAL HIP ARTHROPLASTY ANTERIOR APPROACH;  Surgeon: Peter G Dalldorf, MD;  [facility];  Service: Orthopedics;  Laterality: Right;  . TOTAL KNEE ARTHROPLASTY Right     Current Outpatient Medications  Medication Sig Dispense Refill  . azelastine (ASTELIN) 0.1 %   nasal spray Place 2 sprays into both nostrils 2 (two) times daily.  5  . CALCIUM PO Take 500 mg by mouth 2 (two) times daily.    . Cholecalciferol (VITAMIN D PO) Take 1 tablet by mouth daily.    . cyclobenzaprine (FLEXERIL) 5 MG tablet Take 1 tablet (5 mg total) by mouth 3 (three) times daily as needed for muscle spasms. 10 tablet 0  . DILT-XR 180 MG 24 hr capsule Take 180 mg by mouth daily.  10  . docusate sodium (COLACE) 100 MG capsule Take 100 mg by mouth 2 (two) times daily.     . HYDROcodone-acetaminophen  (NORCO/VICODIN) 5-325 MG tablet Take 1-2 tablets by mouth every 4 (four) hours as needed for moderate pain. 30 tablet 0  . levothyroxine (SYNTHROID, LEVOTHROID) 25 MCG tablet TAKE 1 TABLET BY MOUTH DAILY (Patient taking differently: Take 25 mcg by mouth daily before breakfast. ) 30 tablet 3  . Menthol, Topical Analgesic, (BENGAY EX) Apply 1 application topically 2 (two) times daily as needed (leg pain).    . metoprolol succinate (TOPROL-XL) 100 MG 24 hr tablet TAKE 1 TABLET BY MOUTH EVERY DAY WITH OR IMMEDIATELY FOLLOWING A MEAL 90 tablet 2  . OVER THE COUNTER MEDICATION Take by mouth daily as needed (nasal drip). Tylenol Cold - Mucous Severe (dextromethorphan, acetaminophen, phenylephrine, guaifenesin) - takes early morning if she wakes up with a nasal drip (1 swig)    . potassium chloride (K-DUR,KLOR-CON) 10 MEQ tablet Take 10 mEq by mouth daily.   1  . tiZANidine (ZANAFLEX) 2 MG tablet Take 2 mg by mouth 2 (two) times daily.   5  . warfarin (COUMADIN) 4 MG tablet USE AS DIRECTED BY COUMADIN CLINIC (Patient taking differently: Take 4-6 mg by mouth See admin instructions. Take 1.5 tablets (6 mg totally) by mouth on Mon, Friday; take 1 tablet (4 mg totally) other days during the week.) 40 tablet 3   No current facility-administered medications for this visit.     Allergies:   Patient has no known allergies.   Social History:  The patient  reports that she has never smoked. She has never used smokeless tobacco. She reports current alcohol use. She reports that she does not use drugs.   Family History:  The patient's  family history includes Cancer in her sister; Diabetes in her sister; Hypertension in her father and mother.   ROS:  Please see the history of present illness.   All other systems are personally reviewed and negative.    Exam:    Vital Signs:  None today   Labs/Other Tests and Data Reviewed:    Recent Labs: 09/12/2018: BUN 13; Creatinine, Ser 0.69; Hemoglobin 12.2; Platelets  310; Potassium 4.9; Sodium 132   Wt Readings from Last 3 Encounters:  09/08/18 147 lb (66.7 kg)  06/12/18 150 lb (68 kg)  05/15/18 151 lb (68.5 kg)     Other studies personally reviewed:  Last device remote is reviewed from [facility] PDF dated 8/20 which reveals normal device function, no arrhythmias    ASSESSMENT & PLAN:    1.  Sinus node dysfunction/Heart block - she will undergo PM gen change out in the coming weeks as she is at [facility].  2. Chronic atrial fib - her VR is well controlled. We will follow. 3. HTN - her bp has been controlled previously. She has no symptoms.     COVID 19 screen The patient denies symptoms of COVID 19 at this time.  The   importance of social distancing was discussed today.  Follow-up:  3 months Next remote: 3 months  Current medicines are reviewed at length with the patient today.   The patient does not have concerns regarding her medicines.  The following changes were made today:  none  Labs/ tests ordered today include: none No orders of the defined types were placed in this encounter.    Patient Risk:  after full review of this patients clinical status, I feel that they are at moderate risk at this time.  Today, I have spent 15 minutes with the patient with telehealth technology discussing all of the above .    Signed, Gregg Taylor, MD  07/02/2019 12:40 PM     [facility] [address] [place] [address] [phone] (office) [phone] (fax) 

## 2019-07-18 ENCOUNTER — Other Ambulatory Visit: Payer: Self-pay

## 2019-07-18 ENCOUNTER — Encounter (HOSPITAL_COMMUNITY)
Admission: RE | Disposition: A | Discharge status: Skilled Nursing Facility | Payer: Medicare Other | Source: Home / Self Care | Attending: Internal Medicine

## 2019-07-18 ENCOUNTER — Ambulatory Visit (HOSPITAL_COMMUNITY)
Admission: RE | Admit: 2019-07-18 | Discharge: 2019-07-18 | Disposition: A | Discharge status: Skilled Nursing Facility | Payer: Medicare Other | Attending: Internal Medicine | Admitting: Internal Medicine

## 2019-07-18 DIAGNOSIS — Z79890 Hormone replacement therapy (postmenopausal): Secondary | ICD-10-CM | POA: Insufficient documentation

## 2019-07-18 DIAGNOSIS — Z45010 Encounter for checking and testing of cardiac pacemaker pulse generator [battery]: Secondary | ICD-10-CM | POA: Diagnosis not present

## 2019-07-18 DIAGNOSIS — Z96651 Presence of right artificial knee joint: Secondary | ICD-10-CM | POA: Insufficient documentation

## 2019-07-18 DIAGNOSIS — I10 Essential (primary) hypertension: Secondary | ICD-10-CM | POA: Diagnosis not present

## 2019-07-18 DIAGNOSIS — Z7901 Long term (current) use of anticoagulants: Secondary | ICD-10-CM | POA: Diagnosis not present

## 2019-07-18 DIAGNOSIS — M069 Rheumatoid arthritis, unspecified: Secondary | ICD-10-CM | POA: Insufficient documentation

## 2019-07-18 DIAGNOSIS — Z8249 Family history of ischemic heart disease and other diseases of the circulatory system: Secondary | ICD-10-CM | POA: Insufficient documentation

## 2019-07-18 DIAGNOSIS — Z20828 Contact with and (suspected) exposure to other viral communicable diseases: Secondary | ICD-10-CM | POA: Diagnosis not present

## 2019-07-18 DIAGNOSIS — I482 Chronic atrial fibrillation, unspecified: Secondary | ICD-10-CM | POA: Insufficient documentation

## 2019-07-18 DIAGNOSIS — I495 Sick sinus syndrome: Secondary | ICD-10-CM | POA: Insufficient documentation

## 2019-07-18 DIAGNOSIS — E039 Hypothyroidism, unspecified: Secondary | ICD-10-CM | POA: Diagnosis not present

## 2019-07-18 DIAGNOSIS — E785 Hyperlipidemia, unspecified: Secondary | ICD-10-CM | POA: Insufficient documentation

## 2019-07-18 DIAGNOSIS — Z79899 Other long term (current) drug therapy: Secondary | ICD-10-CM | POA: Insufficient documentation

## 2019-07-18 DIAGNOSIS — K219 Gastro-esophageal reflux disease without esophagitis: Secondary | ICD-10-CM | POA: Diagnosis not present

## 2019-07-18 DIAGNOSIS — Z96641 Presence of right artificial hip joint: Secondary | ICD-10-CM | POA: Diagnosis not present

## 2019-07-18 DIAGNOSIS — M353 Polymyalgia rheumatica: Secondary | ICD-10-CM | POA: Insufficient documentation

## 2019-07-18 HISTORY — PX: PPM GENERATOR CHANGEOUT: EP1233

## 2019-07-18 LAB — CBC
HCT: 40 % (ref 36.0–46.0)
Hemoglobin: 11.7 g/dL — ABNORMAL LOW (ref 12.0–15.0)
MCH: 28.5 pg (ref 26.0–34.0)
MCHC: 29.3 g/dL — ABNORMAL LOW (ref 30.0–36.0)
MCV: 97.6 fL (ref 80.0–100.0)
Platelets: 349 10*3/uL (ref 150–400)
RBC: 4.1 MIL/uL (ref 3.87–5.11)
RDW: 13.5 % (ref 11.5–15.5)
WBC: 5.6 10*3/uL (ref 4.0–10.5)
nRBC: 0 % (ref 0.0–0.2)

## 2019-07-18 LAB — BASIC METABOLIC PANEL
Anion gap: 9 (ref 5–15)
BUN: 18 mg/dL (ref 8–23)
CO2: 37 mmol/L — ABNORMAL HIGH (ref 22–32)
Calcium: 9.8 mg/dL (ref 8.9–10.3)
Chloride: 96 mmol/L — ABNORMAL LOW (ref 98–111)
Creatinine, Ser: 0.71 mg/dL (ref 0.44–1.00)
GFR calc Af Amer: >60 mL/min (ref >60)
GFR calc non Af Amer: >60 mL/min (ref >60)
Glucose, Bld: 91 mg/dL (ref 70–99)
Potassium: 4.1 mmol/L (ref 3.5–5.1)
Sodium: 142 mmol/L (ref 135–145)

## 2019-07-18 LAB — SURGICAL PCR SCREEN
MRSA, PCR: NEGATIVE
Staphylococcus aureus: NEGATIVE

## 2019-07-18 LAB — PROTIME-INR
INR: 1.5 — ABNORMAL HIGH (ref 0.8–1.2)
Prothrombin Time: 17.8 seconds — ABNORMAL HIGH (ref 11.4–15.2)

## 2019-07-18 LAB — SARS CORONAVIRUS 2 BY RT PCR (HOSPITAL ORDER, PERFORMED IN ~~LOC~~ HOSPITAL LAB): SARS Coronavirus 2: NEGATIVE

## 2019-07-18 SURGERY — PPM GENERATOR CHANGEOUT

## 2019-07-18 MED ORDER — SODIUM CHLORIDE 0.9 % IV SOLN
INTRAVENOUS | Status: DC
  Administered 2019-07-18: 09:00:00 via INTRAVENOUS

## 2019-07-18 MED ORDER — MUPIROCIN 2 % EX OINT
1.0000 "application " | TOPICAL_OINTMENT | Freq: Once | CUTANEOUS | Status: AC
  Administered 2019-07-18: 09:00:00 1 via TOPICAL

## 2019-07-18 MED ORDER — SODIUM CHLORIDE 0.9 % IV SOLN
INTRAVENOUS | Status: AC
  Filled 2019-07-18: qty 2

## 2019-07-18 MED ORDER — ACETAMINOPHEN 325 MG PO TABS: 325.0000 mg | ORAL_TABLET | ORAL | Status: DC | PRN

## 2019-07-18 MED ORDER — LIDOCAINE HCL (PF) 1 % IJ SOLN
INTRAMUSCULAR | Status: DC | PRN
  Administered 2019-07-18: 40 mL

## 2019-07-18 MED ORDER — CEFAZOLIN SODIUM-DEXTROSE 2-4 GM/100ML-% IV SOLN
INTRAVENOUS | Status: AC
  Filled 2019-07-18: qty 100

## 2019-07-18 MED ORDER — FENTANYL CITRATE (PF) 100 MCG/2ML IJ SOLN
INTRAMUSCULAR | Status: AC
  Filled 2019-07-18: qty 2

## 2019-07-18 MED ORDER — SODIUM CHLORIDE 0.9 % IV SOLN
80.0000 mg | INTRAVENOUS | Status: AC
  Administered 2019-07-18: 80 mg

## 2019-07-18 MED ORDER — CEFAZOLIN SODIUM-DEXTROSE 2-4 GM/100ML-% IV SOLN
2.0000 g | INTRAVENOUS | Status: AC
  Administered 2019-07-18: 2 g via INTRAVENOUS

## 2019-07-18 MED ORDER — HEPARIN (PORCINE) IN NACL 1000-0.9 UT/500ML-% IV SOLN
INTRAVENOUS | Status: AC
  Filled 2019-07-18: qty 500

## 2019-07-18 MED ORDER — MIDAZOLAM HCL 5 MG/5ML IJ SOLN
INTRAMUSCULAR | Status: AC
  Filled 2019-07-18: qty 5

## 2019-07-18 MED ORDER — LIDOCAINE HCL (PF) 1 % IJ SOLN
INTRAMUSCULAR | Status: AC
  Filled 2019-07-18: qty 60

## 2019-07-18 MED ORDER — ONDANSETRON HCL 4 MG/2ML IJ SOLN: 4.0000 mg | Freq: Four times a day (QID) | INTRAMUSCULAR | Status: DC | PRN

## 2019-07-18 MED ORDER — FENTANYL CITRATE (PF) 100 MCG/2ML IJ SOLN
INTRAMUSCULAR | Status: DC | PRN
  Administered 2019-07-18 (×3): 12.5 ug via INTRAVENOUS

## 2019-07-18 MED ORDER — MIDAZOLAM HCL 5 MG/5ML IJ SOLN
INTRAMUSCULAR | Status: DC | PRN
  Administered 2019-07-18 (×3): 1 mg via INTRAVENOUS

## 2019-07-18 MED ORDER — MUPIROCIN 2 % EX OINT
TOPICAL_OINTMENT | CUTANEOUS | Status: AC
  Administered 2019-07-18: 1 via TOPICAL
  Filled 2019-07-18: qty 22

## 2019-07-18 SURGICAL SUPPLY — 5 items
CABLE SURGICAL S-101-97-12 (CABLE) ×2 IMPLANT
IPG PACE AZUR XT DR MRI W1DR01 (Pacemaker) IMPLANT
PACE AZURE XT DR MRI W1DR01 (Pacemaker) ×2 IMPLANT
PAD PRO RADIOLUCENT 2001M-C (PAD) ×2 IMPLANT
TRAY PACEMAKER INSERTION (PACKS) ×2 IMPLANT

## 2019-07-18 NOTE — Interval H&P Note (Signed)
History and Physical Interval Note:  07/18/2019 10:06 AM  Patricia Holt  has presented today for surgery, with the diagnosis of eri.  The various methods of treatment have been discussed with the patient and family. After consideration of risks, benefits and other options for treatment, the patient has consented to  Procedure(s): PPM GENERATOR CHANGEOUT (N/A) as a surgical intervention.  The patient's history has been reviewed, patient examined, no change in status, stable for surgery.  I have reviewed the patient's chart and labs.  Questions were answered to the patient's satisfaction.     Cristopher Peru

## 2019-07-18 NOTE — Discharge Instructions (Signed)
Pacemaker Battery Change, Care After This sheet gives you information about how to care for yourself after your procedure. Your health care provider may also give you more specific instructions. If you have problems or questions, contact your health care provider. What can I expect after the procedure? After your procedure, it is common to have:  Pain or soreness at the site where the pacemaker was inserted.  Swelling at the site where the pacemaker was inserted. Follow these instructions at home: Incision care   Keep the incision clean and dry. ? Do not take baths, swim, or use a hot tub until fully healed. ? You may shower in 5 days. ? Pat the area dry with a clean towel. Do not rub the area. This may cause bleeding.  Follow instructions from your health care provider about how to take care of your incision. Make sure you: ? Wash your hands with soap and water before you change your bandage (dressing). If soap and water are not available, use hand sanitizer. ? Change your dressing as told by your health care provider. ? Leave stitches (sutures), skin glue, or adhesive strips in place. These skin closures may need to stay in place for 2 weeks or longer. If adhesive strip edges start to loosen and curl up, you may trim the loose edges. Do not remove adhesive strips completely unless your health care provider tells you to do that.  Check your incision area every day for signs of infection. Check for: ? More redness, swelling, or pain. ? More fluid or blood. ? Warmth. ? Pus or a bad smell. Activity  Do not lift anything that is heavier than 10 lb for 5 days.  For the first 2 weeks, or as long as told by your health care provider: ? Avoid lifting your left arm higher than your shoulder. ? Be gentle when you move your arms over your head. It is okay to raise your arm to comb your hair. ? Avoid strenuous exercise.  Ask your health care provider when it is okay to: ? Resume your normal  activities. ? Return to work or school. ? Resume sexual activity. Eating and drinking  Eat a heart-healthy diet. This should include plenty of fresh fruits and vegetables, whole grains, low-fat dairy products, and lean protein like chicken and fish.  Limit alcohol intake to no more than 1 drink a day for non-pregnant women and 2 drinks a day for men. One drink equals 12 oz of beer, 5 oz of wine, or 1 oz of hard liquor.  Check ingredients and nutrition facts on packaged foods and beverages. Avoid the following types of food: ? Food that is high in salt (sodium). ? Food that is high in saturated fat, like full-fat dairy or red meat. ? Food that is high in trans fat, like fried food. ? Food and drinks that are high in sugar. Lifestyle  Do not use any products that contain nicotine or tobacco, such as cigarettes and e-cigarettes. If you need help quitting, ask your health care provider.  Take steps to manage and control your weight.  Get regular exercise. Aim for 150 minutes of moderate-intensity exercise (such as walking or yoga) or 75 minutes of vigorous exercise (such as running or swimming) each week.  Manage other health problems, such as diabetes or high blood pressure. Ask your health care provider how you can manage these conditions. General instructions  Do not drive for 24 hours after your procedure if you were given  a medicine to help you relax (sedative).  Take over-the-counter and prescription medicines only as told by your health care provider.  Avoid putting pressure on the area where the pacemaker was placed.  If you need an MRI after your pacemaker has been placed, be sure to tell the health care provider who orders the MRI that you have a pacemaker.  Avoid close and prolonged exposure to electrical devices that have strong magnetic fields. These include: ? Cell phones. Avoid keeping them in a pocket near the pacemaker, and try using the ear opposite the  pacemaker. ? MP3 players. ? Household appliances, like microwaves. ? Metal detectors. ? Electric generators. ? High-tension wires.  Keep all follow-up visits as directed by your health care provider. This is important. Contact a health care provider if:  You have pain at the incision site that is not relieved by over-the-counter or prescription medicines.  You have any of these around your incision site or coming from it: ? More redness, swelling, or pain. ? Fluid or blood. ? Warmth to the touch. ? Pus or a bad smell.  You have a fever.  You feel brief, occasional palpitations, light-headedness, or any symptoms that you think might be related to your heart. Get help right away if:  You experience chest pain that is different from the pain at the pacemaker site.  You develop a red streak that extends above or below the incision site.  You experience shortness of breath.  You have palpitations or an irregular heartbeat.  You have light-headedness that does not go away quickly.  You faint or have dizzy spells.  Your pulse suddenly drops or increases rapidly and does not return to normal.  You begin to gain weight and your legs and ankles swell. Summary  After your procedure, it is common to have pain, soreness, and some swelling where the pacemaker was inserted.  Make sure to keep your incision clean and dry. Follow instructions from your health care provider about how to take care of your incision.  Check your incision every day for signs of infection, such as more pain or swelling, pus or a bad smell, warmth, or leaking fluid and blood.  Avoid strenuous exercise and lifting your left arm higher than your shoulder for 2 weeks, or as long as told by your health care provider. This information is not intended to replace advice given to you by your health care provider. Make sure you discuss any questions you have with your health care provider. Document Released:  08/07/2013 Document Revised: 09/29/2017 Document Reviewed: 09/08/2016 Elsevier Patient Education  2020 [employer].

## 2019-07-19 ENCOUNTER — Encounter (HOSPITAL_COMMUNITY): Payer: Self-pay | Admitting: Internal Medicine

## 2019-07-30 ENCOUNTER — Encounter: Payer: Medicare Other | Admitting: Internal Medicine

## 2019-07-30 ENCOUNTER — Telehealth (INDEPENDENT_AMBULATORY_CARE_PROVIDER_SITE_OTHER): Payer: Medicare Other | Admitting: Student

## 2019-07-30 ENCOUNTER — Other Ambulatory Visit: Payer: Self-pay

## 2019-07-30 ENCOUNTER — Telehealth: Payer: Self-pay

## 2019-07-30 DIAGNOSIS — Z950 Presence of cardiac pacemaker: Secondary | ICD-10-CM

## 2019-07-30 DIAGNOSIS — I495 Sick sinus syndrome: Secondary | ICD-10-CM

## 2019-07-30 DIAGNOSIS — M549 Dorsalgia, unspecified: Secondary | ICD-10-CM | POA: Diagnosis not present

## 2019-07-30 DIAGNOSIS — D649 Anemia, unspecified: Secondary | ICD-10-CM | POA: Diagnosis not present

## 2019-07-30 DIAGNOSIS — I4891 Unspecified atrial fibrillation: Secondary | ICD-10-CM | POA: Diagnosis not present

## 2019-07-30 DIAGNOSIS — I5032 Chronic diastolic (congestive) heart failure: Secondary | ICD-10-CM | POA: Diagnosis not present

## 2019-07-30 NOTE — Progress Notes (Signed)
REMOTE Wound check appointment. See attachment for full report.   Steri-strips previously removed. Wound appears to be without redness or edema. Incision edges approximated, wound well healed. Assessed visually and confirmed by nurse on site.   Normal device function. Thresholds, sensing, and impedances consistent with implant measurements. Device programmed at 3.5V/auto capture programmed on for extra safety margin until 3 month visit. 154 "VT" episodes noted. Available EGMs appear to show AF with RVR. Histograms show variable rate control with rates mostly between 60-110. Patient educated about wound care, arm mobility, lifting restrictions. ROV in 3 months with Dr. Lovena Le.  Screenshot taken with patient permission

## 2019-07-30 NOTE — Telephone Encounter (Signed)
Transmission received.

## 2019-07-31 LAB — CUP PACEART REMOTE DEVICE CHECK
Date Time Interrogation Session: 20200930075657
Implantable Lead Implant Date: 20100409
Implantable Lead Implant Date: 20100409
Implantable Lead Location: 753859
Implantable Lead Location: 753860
Implantable Lead Model: 5076
Implantable Lead Model: 5092
Implantable Pulse Generator Implant Date: 20200917

## 2019-08-07 DIAGNOSIS — H353114 Nonexudative age-related macular degeneration, right eye, advanced atrophic with subfoveal involvement: Secondary | ICD-10-CM | POA: Diagnosis not present

## 2019-08-07 DIAGNOSIS — H353223 Exudative age-related macular degeneration, left eye, with inactive scar: Secondary | ICD-10-CM | POA: Diagnosis not present

## 2019-08-07 DIAGNOSIS — H43813 Vitreous degeneration, bilateral: Secondary | ICD-10-CM | POA: Diagnosis not present

## 2019-08-13 DIAGNOSIS — Z20828 Contact with and (suspected) exposure to other viral communicable diseases: Secondary | ICD-10-CM | POA: Diagnosis not present

## 2019-08-15 DIAGNOSIS — E0590 Thyrotoxicosis, unspecified without thyrotoxic crisis or storm: Secondary | ICD-10-CM | POA: Diagnosis not present

## 2019-08-15 DIAGNOSIS — Z79899 Other long term (current) drug therapy: Secondary | ICD-10-CM | POA: Diagnosis not present

## 2019-08-15 DIAGNOSIS — R319 Hematuria, unspecified: Secondary | ICD-10-CM | POA: Diagnosis not present

## 2019-08-15 DIAGNOSIS — E559 Vitamin D deficiency, unspecified: Secondary | ICD-10-CM | POA: Diagnosis not present

## 2019-08-15 DIAGNOSIS — N390 Urinary tract infection, site not specified: Secondary | ICD-10-CM | POA: Diagnosis not present

## 2019-08-20 DIAGNOSIS — Z20828 Contact with and (suspected) exposure to other viral communicable diseases: Secondary | ICD-10-CM | POA: Diagnosis not present

## 2019-08-27 DIAGNOSIS — Z20828 Contact with and (suspected) exposure to other viral communicable diseases: Secondary | ICD-10-CM | POA: Diagnosis not present

## 2019-08-29 DIAGNOSIS — I4891 Unspecified atrial fibrillation: Secondary | ICD-10-CM | POA: Diagnosis not present

## 2019-08-29 DIAGNOSIS — R5381 Other malaise: Secondary | ICD-10-CM | POA: Diagnosis not present

## 2019-08-29 DIAGNOSIS — Z7901 Long term (current) use of anticoagulants: Secondary | ICD-10-CM | POA: Diagnosis not present

## 2019-08-29 DIAGNOSIS — M2550 Pain in unspecified joint: Secondary | ICD-10-CM | POA: Diagnosis not present

## 2019-08-29 DIAGNOSIS — D649 Anemia, unspecified: Secondary | ICD-10-CM | POA: Diagnosis not present

## 2019-08-30 DIAGNOSIS — R110 Nausea: Secondary | ICD-10-CM | POA: Diagnosis not present

## 2019-08-30 DIAGNOSIS — Z23 Encounter for immunization: Secondary | ICD-10-CM | POA: Diagnosis not present

## 2019-09-03 DIAGNOSIS — Z20828 Contact with and (suspected) exposure to other viral communicable diseases: Secondary | ICD-10-CM | POA: Diagnosis not present

## 2019-09-05 DIAGNOSIS — I4891 Unspecified atrial fibrillation: Secondary | ICD-10-CM | POA: Diagnosis not present

## 2019-09-05 DIAGNOSIS — Z7901 Long term (current) use of anticoagulants: Secondary | ICD-10-CM | POA: Diagnosis not present

## 2019-09-10 DIAGNOSIS — Z20828 Contact with and (suspected) exposure to other viral communicable diseases: Secondary | ICD-10-CM | POA: Diagnosis not present

## 2019-09-12 DIAGNOSIS — I4891 Unspecified atrial fibrillation: Secondary | ICD-10-CM | POA: Diagnosis not present

## 2019-09-17 DIAGNOSIS — Z20828 Contact with and (suspected) exposure to other viral communicable diseases: Secondary | ICD-10-CM | POA: Diagnosis not present

## 2019-09-20 DIAGNOSIS — Z20828 Contact with and (suspected) exposure to other viral communicable diseases: Secondary | ICD-10-CM | POA: Diagnosis not present

## 2019-09-25 DIAGNOSIS — Z20828 Contact with and (suspected) exposure to other viral communicable diseases: Secondary | ICD-10-CM | POA: Diagnosis not present

## 2019-09-26 DIAGNOSIS — I4891 Unspecified atrial fibrillation: Secondary | ICD-10-CM | POA: Diagnosis not present

## 2019-09-26 DIAGNOSIS — Z7901 Long term (current) use of anticoagulants: Secondary | ICD-10-CM | POA: Diagnosis not present

## 2019-09-27 DIAGNOSIS — Z20828 Contact with and (suspected) exposure to other viral communicable diseases: Secondary | ICD-10-CM | POA: Diagnosis not present

## 2019-09-30 DIAGNOSIS — M549 Dorsalgia, unspecified: Secondary | ICD-10-CM | POA: Diagnosis not present

## 2019-09-30 DIAGNOSIS — I119 Hypertensive heart disease without heart failure: Secondary | ICD-10-CM | POA: Diagnosis not present

## 2019-09-30 DIAGNOSIS — M810 Age-related osteoporosis without current pathological fracture: Secondary | ICD-10-CM | POA: Diagnosis not present

## 2019-09-30 DIAGNOSIS — K5900 Constipation, unspecified: Secondary | ICD-10-CM | POA: Diagnosis not present

## 2019-10-01 DIAGNOSIS — Z20828 Contact with and (suspected) exposure to other viral communicable diseases: Secondary | ICD-10-CM | POA: Diagnosis not present

## 2019-10-02 DIAGNOSIS — I10 Essential (primary) hypertension: Secondary | ICD-10-CM | POA: Diagnosis not present

## 2019-10-02 DIAGNOSIS — Z79899 Other long term (current) drug therapy: Secondary | ICD-10-CM | POA: Diagnosis not present

## 2019-10-02 DIAGNOSIS — R319 Hematuria, unspecified: Secondary | ICD-10-CM | POA: Diagnosis not present

## 2019-10-02 DIAGNOSIS — N390 Urinary tract infection, site not specified: Secondary | ICD-10-CM | POA: Diagnosis not present

## 2019-10-02 DIAGNOSIS — E559 Vitamin D deficiency, unspecified: Secondary | ICD-10-CM | POA: Diagnosis not present

## 2019-10-03 DIAGNOSIS — Z20828 Contact with and (suspected) exposure to other viral communicable diseases: Secondary | ICD-10-CM | POA: Diagnosis not present

## 2019-10-07 DIAGNOSIS — Z20828 Contact with and (suspected) exposure to other viral communicable diseases: Secondary | ICD-10-CM | POA: Diagnosis not present

## 2019-10-10 DIAGNOSIS — I4891 Unspecified atrial fibrillation: Secondary | ICD-10-CM | POA: Diagnosis not present

## 2019-10-17 ENCOUNTER — Telehealth: Payer: Self-pay | Admitting: Internal Medicine

## 2019-10-17 NOTE — Telephone Encounter (Signed)
Cordelia Pen, Electrical engineer for [employer] called. She initially called to confirm the patient's upcoming appointment. After confirming the appointment, she stated that the weekend nurse that cares for the patient tested positive for COVID on Monday. The weekend nurse was either tested Thursday or Friday, and results came back Monday. The staff and residents are constantly being tested.The patient's last two tests have been negative.   Debbie wanted to know if she needs to reschedule this appointment or not.

## 2019-10-18 NOTE — Telephone Encounter (Signed)
appt changed to virtual

## 2019-10-22 ENCOUNTER — Other Ambulatory Visit: Payer: Self-pay

## 2019-10-22 ENCOUNTER — Telehealth (INDEPENDENT_AMBULATORY_CARE_PROVIDER_SITE_OTHER): Payer: Medicare Other | Admitting: Internal Medicine

## 2019-10-22 DIAGNOSIS — Z950 Presence of cardiac pacemaker: Secondary | ICD-10-CM | POA: Diagnosis not present

## 2019-10-22 DIAGNOSIS — I10 Essential (primary) hypertension: Secondary | ICD-10-CM | POA: Diagnosis not present

## 2019-10-22 DIAGNOSIS — I4819 Other persistent atrial fibrillation: Secondary | ICD-10-CM | POA: Diagnosis not present

## 2019-10-22 DIAGNOSIS — I4891 Unspecified atrial fibrillation: Secondary | ICD-10-CM

## 2019-10-22 DIAGNOSIS — I495 Sick sinus syndrome: Secondary | ICD-10-CM

## 2019-10-22 NOTE — Progress Notes (Signed)
Electrophysiology TeleHealth Note   Due to national recommendations of social distancing due to COVID 19, an audio/video telehealth visit is felt to be most appropriate for this patient at this time.  See MyChart message from today for the patient's consent to telehealth for [facility].   Date:  10/22/2019   ID:  Patricia Holt, Patricia Holt 09-04-1924, MRN [id number]  Location: patient's home  Provider location: [address] [place]  Evaluation Performed: Follow-up visit  PCP:  Lavone Orn, MD  Cardiologist:  No primary care provider on file.  Electrophysiologist:  Dr Lovena Le  Chief Complaint:  "I've been feeling good."  History of Present Illness:    Patricia Holt is a 85 y.o. female with a h/o symptomatic bradycardia, s/p PPM insertion,  who presents via audio/video conferencing for a telehealth visit today. Patricia Holt returns today for ongoing evaluation and management of her PPM and now chronic atrial fibrillation. She is a very pleasant 85 yo woman who developed symptomatic bradycardia and underwent PPM in 2010. She initially developed atrial fibrillation in 2009. She has had chronic atrial fibrillation and been treated with a strategy of rate control. Despite her advanced age, she has remained active. She has known mild aortic stenosis. No syncope. No edema. She does admit to becoming more sedentary in the past year. Since last being seen in our clinic, the patient reports doing very well.  Today, she denies symptoms of palpitations, chest pain, shortness of breath,  lower extremity edema, dizziness, presyncope, or syncope.  The patient is otherwise without complaint today.  The patient denies symptoms of fevers, chills, cough, or new SOB worrisome for COVID 19.  Past Medical History:  Diagnosis Date  . Age-related macular degeneration, dry, left eye   . Age-related macular degeneration, wet, right eye ([place])   . Anxiety    "maybe occasionally"  (09/11/2018)  . Aortic valve disorder   . Atrial fibrillation (HCC)    paroxysmal, status post successful vessel cardioversion 5/09, skains,transient left ventricular systolic dysfunction, likely secondary to cardioversion/stunning of the myocardium, no evidence significant CAD, echo 7/09 shows normal ejection fraction  . Carpal tunnel syndrome   . Chronic anticoagulation   . DJD (degenerative joint disease)   . Dysrhythmia   . GERD (gastroesophageal reflux disease)    OTC occasionally  . Goiter   . Heart murmur   . History of blood transfusion    "related to femur OR" (09/11/2018)  . HTN (hypertension)   . Hyperlipidemia    "hx" (09/11/2018)  . Hypothyroidism   . Macular degeneration   . Mitral regurgitation   . Osteoporosis    , severe, hypercalciuria, [place] since 2005, T10 compression fracture 2009  . Other disorder of calcium metabolism   . Polymyalgia rheumatica ([place])   . Presence of permanent cardiac pacemaker   . Rheumatoid arthritis ([place])    "pretty much all over" (09/11/2018)  . Sinoatrial node dysfunction (HCC)   . Sinus drainage    in the morning, clear drainage  . Skin cancer    "nose and forehead/eyebrow" (09/11/2018)    Past Surgical History:  Procedure Laterality Date  . CARDIAC CATHETERIZATION  2000s  . CARPAL TUNNEL RELEASE Bilateral   . CATARACT EXTRACTION W/ INTRAOCULAR LENS  IMPLANT, BILATERAL Bilateral   . DILATION AND CURETTAGE OF UTERUS  X 3  . FEMUR FRACTURE SURGERY Left   . FRACTURE SURGERY    . INSERT / REPLACE / REMOVE PACEMAKER  Dual chamber permanent pacer insertion for tachy brady syndrome, 01/2009  . JOINT REPLACEMENT    . PPM GENERATOR CHANGEOUT N/A 07/18/2019   Procedure: PPM GENERATOR CHANGEOUT;  Surgeon: Evans Lance, MD;  Location: [facility];  Service: Cardiovascular;  Laterality: N/A;  . SKIN CANCER EXCISION     "nose and forehead/eyebrow"  . TONSILLECTOMY AND ADENOIDECTOMY    . TOTAL HIP ARTHROPLASTY Right 12/16/2014    Procedure: RIGHT TOTAL HIP ARTHROPLASTY ANTERIOR APPROACH;  Surgeon: Hessie Dibble, MD;  Location: [facility];  Service: Orthopedics;  Laterality: Right;  . TOTAL KNEE ARTHROPLASTY Right     Current Outpatient Medications  Medication Sig Dispense Refill  . acetaminophen (TYLENOL) 325 MG tablet Take 975 mg by mouth every 8 (eight) hours.    Marland Kitchen alum & mag hydroxide-simeth (MAALOX/MYLANTA) 200-200-20 MG/5ML suspension Take 30 mLs by mouth every 4 (four) hours as needed for indigestion or heartburn.    Marland Kitchen azelastine (ASTELIN) 0.1 % nasal spray Place 2 sprays into both nostrils 2 (two) times daily.  5  . bisacodyl (DULCOLAX) 10 MG suppository Place 10 mg rectally daily as needed for moderate constipation.    . calcium carbonate (OS-CAL - DOSED IN MG OF ELEMENTAL CALCIUM) 1250 (500 Ca) MG tablet Take 1 tablet by mouth 2 (two) times daily.    . cholecalciferol (VITAMIN D3) 25 MCG (1000 UT) tablet Take 2,000 Units by mouth daily.    . cyclobenzaprine (FLEXERIL) 5 MG tablet Take 1 tablet (5 mg total) by mouth 3 (three) times daily as needed for muscle spasms. (Patient taking differently: Take 5 mg by mouth every 8 (eight) hours as needed for muscle spasms. ) 10 tablet 0  . diclofenac sodium (VOLTAREN) 1 % GEL Apply 2 g topically every 12 (twelve) hours as needed (knee pain).    Marland Kitchen DILT-XR 180 MG 24 hr capsule Take 180 mg by mouth daily.  10  . furosemide (LASIX) 20 MG tablet Take 60 mg by mouth daily.    . furosemide (LASIX) 40 MG tablet Take 40 mg by mouth every evening. At 1600    . gabapentin (NEURONTIN) 100 MG capsule Take 200 mg by mouth 2 (two) times daily.    Marland Kitchen guaifenesin (ROBITUSSIN) 100 MG/5ML syrup Take 300 mg by mouth every 4 (four) hours as needed for cough.    Marland Kitchen ipratropium-albuterol (DUONEB) 0.5-2.5 (3) MG/3ML SOLN Take 3 mLs by nebulization every 2 (two) hours as needed (shortness of breath).    Marland Kitchen levothyroxine (SYNTHROID, LEVOTHROID) 25 MCG tablet TAKE 1 TABLET BY MOUTH DAILY (Patient  taking differently: Take 25 mcg by mouth daily before breakfast. ) 30 tablet 3  . magnesium hydroxide (MILK OF MAGNESIA) 400 MG/5ML suspension Take 30 mLs by mouth daily as needed for mild constipation.    . Melatonin 5 MG CAPS Take 10 mg by mouth at bedtime.    . metoprolol succinate (TOPROL-XL) 50 MG 24 hr tablet Take 50 mg by mouth daily. Hold if SBP <= to 100 and or/ pulse <=60    . omeprazole (PRILOSEC) 40 MG capsule Take 40 mg by mouth daily.    Marland Kitchen OVER THE COUNTER MEDICATION Take 30 mLs by mouth every 4 (four) hours as needed (nasal drip). Tylenol Cold - Mucous Severe (dextromethorphan, acetaminophen, phenylephrine, guaifenesin)    . polyethylene glycol (MIRALAX / GLYCOLAX) 17 g packet Take 17 g by mouth 2 (two) times daily.    . potassium chloride SA (K-DUR) 20 MEQ tablet Take 20  mEq by mouth 2 (two) times daily.    . promethazine (PHENERGAN) 25 MG/ML injection Inject 12.5 mg into the muscle every 6 (six) hours as needed for nausea or vomiting.    . senna (SENOKOT) 8.6 MG TABS tablet Take 2 tablets by mouth at bedtime.    . traMADol (ULTRAM) 50 MG tablet Take 50 mg by mouth every 6 (six) hours as needed for moderate pain or severe pain.    . vitamin B-12 (CYANOCOBALAMIN) 1000 MCG tablet Take 2,000 mcg by mouth every evening.    . warfarin (COUMADIN) 2.5 MG tablet Take 2.5 mg by mouth See admin instructions. Alternate taking 2.5 mg in the afternoon one day and 3 mg the next    . warfarin (COUMADIN) 3 MG tablet Take 3 mg by mouth See admin instructions. Alternate taking 3 mg in the afternoon one day and 2.5 mg the next     No current facility-administered medications for this visit.    Allergies:   Patient has no known allergies.   Social History:  The patient  reports that she has never smoked. She has never used smokeless tobacco. She reports current alcohol use. She reports that she does not use drugs.   Family History:  The patient's  family history includes Cancer in her sister;  Diabetes in her sister; Hypertension in her father and mother.   ROS:  Please see the history of present illness.   All other systems are personally reviewed and negative.    Exam:    Vital Signs:  LMP  (LMP Unknown)  BP - 118/72, P - 77, R - 18, Wtr. 155, oxygen sat 98% on 2 lit   Labs/Other Tests and Data Reviewed:    Recent Labs: 07/18/2019: BUN 18; Creatinine, Ser 0.71; Hemoglobin 11.7; Platelets 349; Potassium 4.1; Sodium 142   Wt Readings from Last 3 Encounters:  07/18/19 143 lb (64.9 kg)  09/08/18 147 lb (66.7 kg)  06/12/18 150 lb (68 kg)     Other studies personally reviewed:  Last device remote is reviewed from [facility] PDF dated 9/20 which reveals normal device function, no arrhythmias except for chronic atrial fib    ASSESSMENT & PLAN:    1.  Persistent atrial fib - she appears to be well controlled based on her lack of symptoms.  2. PPM - her last interrogation demonstrates normal device function.  3. HTN - her blood pressure    COVID 19 screen The patient denies symptoms of COVID 19 at this time.  The importance of social distancing was discussed today.  Follow-up:  One year Next remote: next month  Current medicines are reviewed at length with the patient today.   The patient does not have concerns regarding her medicines.  The following changes were made today:  none  Labs/ tests ordered today include: none No orders of the defined types were placed in this encounter.    Patient Risk:  after full review of this patients clinical status, I feel that they are at moderate risk at this time.  Today, I have spent 15 minutes with the patient with telehealth technology discussing all of the above .    Signed, Cristopher Peru, MD  10/22/2019 10:57 AM     [place] [address] [phone] (office) [phone] (fax)

## 2019-10-28 DIAGNOSIS — R5381 Other malaise: Secondary | ICD-10-CM | POA: Diagnosis not present

## 2019-10-28 DIAGNOSIS — M549 Dorsalgia, unspecified: Secondary | ICD-10-CM | POA: Diagnosis not present

## 2019-10-28 DIAGNOSIS — D649 Anemia, unspecified: Secondary | ICD-10-CM | POA: Diagnosis not present

## 2019-10-28 DIAGNOSIS — I119 Hypertensive heart disease without heart failure: Secondary | ICD-10-CM | POA: Diagnosis not present

## 2019-11-04 DIAGNOSIS — Z20828 Contact with and (suspected) exposure to other viral communicable diseases: Secondary | ICD-10-CM | POA: Diagnosis not present

## 2019-11-04 DIAGNOSIS — Z23 Encounter for immunization: Secondary | ICD-10-CM | POA: Diagnosis not present

## 2019-11-07 DIAGNOSIS — Z20828 Contact with and (suspected) exposure to other viral communicable diseases: Secondary | ICD-10-CM | POA: Diagnosis not present

## 2019-11-07 DIAGNOSIS — I4891 Unspecified atrial fibrillation: Secondary | ICD-10-CM | POA: Diagnosis not present

## 2019-11-09 DIAGNOSIS — Z20828 Contact with and (suspected) exposure to other viral communicable diseases: Secondary | ICD-10-CM | POA: Diagnosis not present

## 2019-11-11 DIAGNOSIS — Z20828 Contact with and (suspected) exposure to other viral communicable diseases: Secondary | ICD-10-CM | POA: Diagnosis not present

## 2019-11-14 DIAGNOSIS — Z20828 Contact with and (suspected) exposure to other viral communicable diseases: Secondary | ICD-10-CM | POA: Diagnosis not present

## 2019-11-18 DIAGNOSIS — Z20822 Contact with and (suspected) exposure to covid-19: Secondary | ICD-10-CM | POA: Diagnosis not present

## 2019-11-21 DIAGNOSIS — Z20828 Contact with and (suspected) exposure to other viral communicable diseases: Secondary | ICD-10-CM | POA: Diagnosis not present

## 2019-11-21 DIAGNOSIS — I4891 Unspecified atrial fibrillation: Secondary | ICD-10-CM | POA: Diagnosis not present

## 2019-11-22 DIAGNOSIS — J811 Chronic pulmonary edema: Secondary | ICD-10-CM | POA: Diagnosis not present

## 2019-11-22 DIAGNOSIS — R05 Cough: Secondary | ICD-10-CM | POA: Diagnosis not present

## 2019-11-23 DIAGNOSIS — U071 COVID-19: Secondary | ICD-10-CM | POA: Diagnosis not present

## 2019-11-23 DIAGNOSIS — D649 Anemia, unspecified: Secondary | ICD-10-CM | POA: Diagnosis not present

## 2019-11-23 DIAGNOSIS — B342 Coronavirus infection, unspecified: Secondary | ICD-10-CM | POA: Diagnosis not present

## 2019-11-29 DIAGNOSIS — I4891 Unspecified atrial fibrillation: Secondary | ICD-10-CM | POA: Diagnosis not present

## 2019-11-29 DIAGNOSIS — E441 Mild protein-calorie malnutrition: Secondary | ICD-10-CM | POA: Diagnosis not present

## 2019-11-29 DIAGNOSIS — J9601 Acute respiratory failure with hypoxia: Secondary | ICD-10-CM | POA: Diagnosis not present

## 2019-11-29 DIAGNOSIS — U071 COVID-19: Secondary | ICD-10-CM | POA: Diagnosis not present

## 2019-12-03 DIAGNOSIS — Z20828 Contact with and (suspected) exposure to other viral communicable diseases: Secondary | ICD-10-CM | POA: Diagnosis not present

## 2019-12-03 DIAGNOSIS — D649 Anemia, unspecified: Secondary | ICD-10-CM | POA: Diagnosis not present

## 2019-12-05 DIAGNOSIS — I4891 Unspecified atrial fibrillation: Secondary | ICD-10-CM | POA: Diagnosis not present

## 2019-12-07 DIAGNOSIS — D509 Iron deficiency anemia, unspecified: Secondary | ICD-10-CM | POA: Diagnosis not present

## 2019-12-07 DIAGNOSIS — D649 Anemia, unspecified: Secondary | ICD-10-CM | POA: Diagnosis not present

## 2019-12-07 DIAGNOSIS — I509 Heart failure, unspecified: Secondary | ICD-10-CM | POA: Diagnosis not present

## 2019-12-09 ENCOUNTER — Ambulatory Visit (INDEPENDENT_AMBULATORY_CARE_PROVIDER_SITE_OTHER): Payer: Medicare Other | Admitting: *Deleted

## 2019-12-09 DIAGNOSIS — I509 Heart failure, unspecified: Secondary | ICD-10-CM | POA: Diagnosis not present

## 2019-12-09 DIAGNOSIS — D509 Iron deficiency anemia, unspecified: Secondary | ICD-10-CM | POA: Diagnosis not present

## 2019-12-09 DIAGNOSIS — I4891 Unspecified atrial fibrillation: Secondary | ICD-10-CM | POA: Diagnosis not present

## 2019-12-09 LAB — CUP PACEART REMOTE DEVICE CHECK
Battery Remaining Longevity: 167 mo
Battery Voltage: 3.21 V
Brady Statistic AP VP Percent: 0 %
Brady Statistic AP VS Percent: 0 %
Brady Statistic AS VP Percent: 31.76 %
Brady Statistic AS VS Percent: 68.24 %
Brady Statistic RA Percent Paced: 0 %
Brady Statistic RV Percent Paced: 31.76 %
Date Time Interrogation Session: 20210208110847
Implantable Lead Implant Date: 20100409
Implantable Lead Implant Date: 20100409
Implantable Lead Location: 753859
Implantable Lead Location: 753860
Implantable Lead Model: 5076
Implantable Lead Model: 5092
Implantable Pulse Generator Implant Date: 20200917
Lead Channel Impedance Value: 2793 Ohm
Lead Channel Impedance Value: 361 Ohm
Lead Channel Impedance Value: 380 Ohm
Lead Channel Impedance Value: 456 Ohm
Lead Channel Pacing Threshold Amplitude: 0.875 V
Lead Channel Pacing Threshold Pulse Width: 0.4 ms
Lead Channel Sensing Intrinsic Amplitude: 0.5 mV
Lead Channel Sensing Intrinsic Amplitude: 6.625 mV
Lead Channel Sensing Intrinsic Amplitude: 6.625 mV
Lead Channel Setting Pacing Amplitude: 2.5 V
Lead Channel Setting Pacing Pulse Width: 0.4 ms
Lead Channel Setting Sensing Sensitivity: 2 mV

## 2019-12-10 DIAGNOSIS — R319 Hematuria, unspecified: Secondary | ICD-10-CM | POA: Diagnosis not present

## 2019-12-10 DIAGNOSIS — N390 Urinary tract infection, site not specified: Secondary | ICD-10-CM | POA: Diagnosis not present

## 2019-12-10 NOTE — Progress Notes (Signed)
PPM Remote  

## 2019-12-12 DIAGNOSIS — G4700 Insomnia, unspecified: Secondary | ICD-10-CM | POA: Diagnosis not present

## 2019-12-16 DIAGNOSIS — I4891 Unspecified atrial fibrillation: Secondary | ICD-10-CM | POA: Diagnosis not present

## 2019-12-16 DIAGNOSIS — D509 Iron deficiency anemia, unspecified: Secondary | ICD-10-CM | POA: Diagnosis not present

## 2019-12-20 DIAGNOSIS — R319 Hematuria, unspecified: Secondary | ICD-10-CM | POA: Diagnosis not present

## 2019-12-20 DIAGNOSIS — N390 Urinary tract infection, site not specified: Secondary | ICD-10-CM | POA: Diagnosis not present

## 2019-12-20 DIAGNOSIS — Z79899 Other long term (current) drug therapy: Secondary | ICD-10-CM | POA: Diagnosis not present

## 2019-12-24 DIAGNOSIS — R0602 Shortness of breath: Secondary | ICD-10-CM | POA: Diagnosis not present

## 2019-12-29 DIAGNOSIS — N390 Urinary tract infection, site not specified: Secondary | ICD-10-CM | POA: Diagnosis not present

## 2019-12-29 DIAGNOSIS — D649 Anemia, unspecified: Secondary | ICD-10-CM | POA: Diagnosis not present

## 2019-12-29 DIAGNOSIS — I5031 Acute diastolic (congestive) heart failure: Secondary | ICD-10-CM | POA: Diagnosis not present

## 2019-12-29 DIAGNOSIS — J9621 Acute and chronic respiratory failure with hypoxia: Secondary | ICD-10-CM | POA: Diagnosis not present

## 2019-12-30 DIAGNOSIS — Z7901 Long term (current) use of anticoagulants: Secondary | ICD-10-CM | POA: Diagnosis not present

## 2019-12-30 DIAGNOSIS — I4891 Unspecified atrial fibrillation: Secondary | ICD-10-CM | POA: Diagnosis not present

## 2019-12-30 DIAGNOSIS — Z23 Encounter for immunization: Secondary | ICD-10-CM | POA: Diagnosis not present

## 2020-01-01 DIAGNOSIS — R0602 Shortness of breath: Secondary | ICD-10-CM | POA: Diagnosis not present

## 2020-01-01 DIAGNOSIS — Z79899 Other long term (current) drug therapy: Secondary | ICD-10-CM | POA: Diagnosis not present

## 2020-01-01 DIAGNOSIS — I509 Heart failure, unspecified: Secondary | ICD-10-CM | POA: Diagnosis not present

## 2020-01-01 DIAGNOSIS — D649 Anemia, unspecified: Secondary | ICD-10-CM | POA: Diagnosis not present

## 2020-01-06 DIAGNOSIS — I509 Heart failure, unspecified: Secondary | ICD-10-CM | POA: Diagnosis not present

## 2020-01-06 DIAGNOSIS — I4891 Unspecified atrial fibrillation: Secondary | ICD-10-CM | POA: Diagnosis not present

## 2020-01-07 IMAGING — CT CT HEAD W/O CM
4 series · 16 of 47 positions shown, 18 images · non-contrast
Comparison: 07/21/2004

CLINICAL DATA: Fall today.  Patient injured the back of her head.

EXAM:
CT HEAD WITHOUT CONTRAST
TECHNIQUE: Contiguous axial images were obtained from the base of the skull
through the vertex without intravenous contrast.

[Series 3: head wo · axial · 0.42mm/px · z∈[+1244,+1354]mm · 7 of 30 slices shown, 9 images]
[im 4/30  brain]
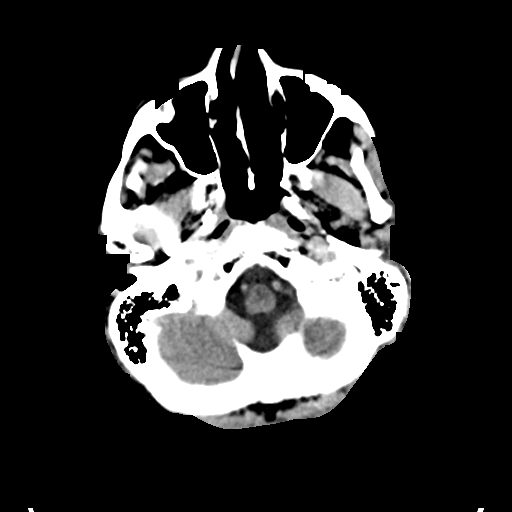
[im 4/30  bone]
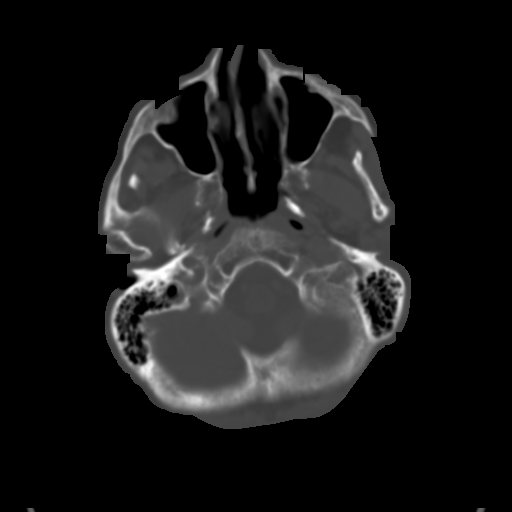
[im 8/30  brain]
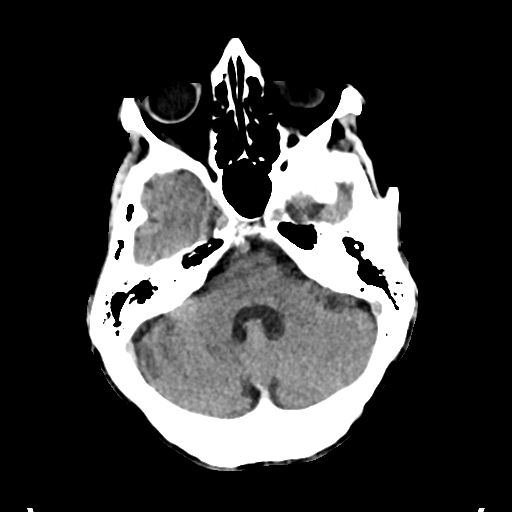
[im 11/30  brain]
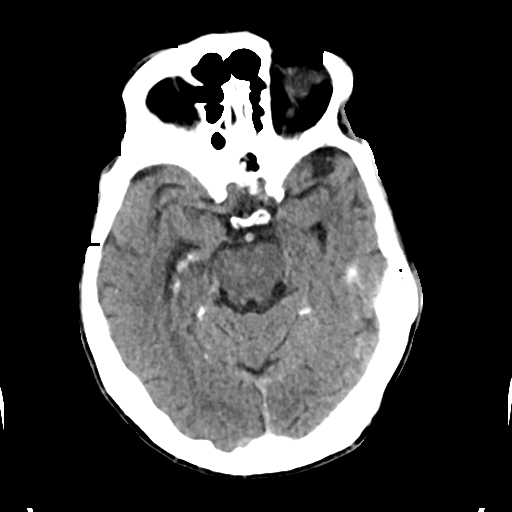
[im 15/30  brain]
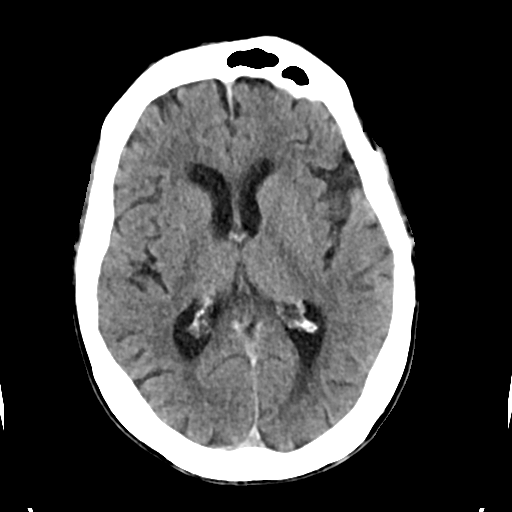
[im 19/30  brain]
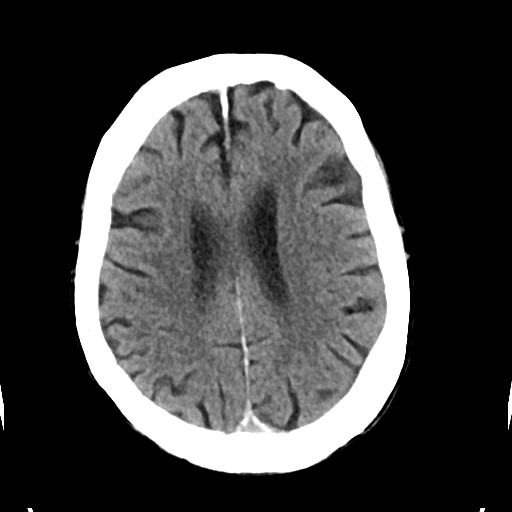
[im 19/30  bone]
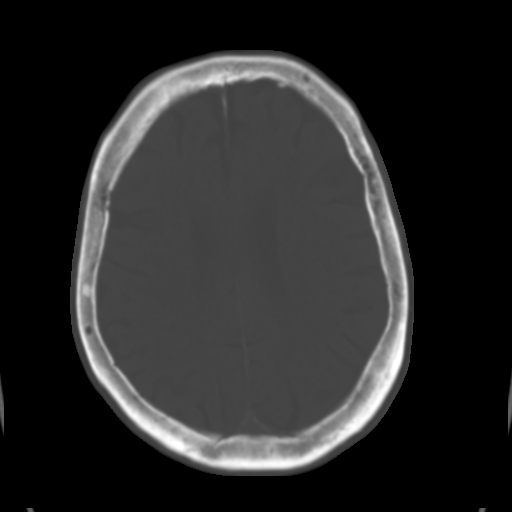
[im 22/30  brain]
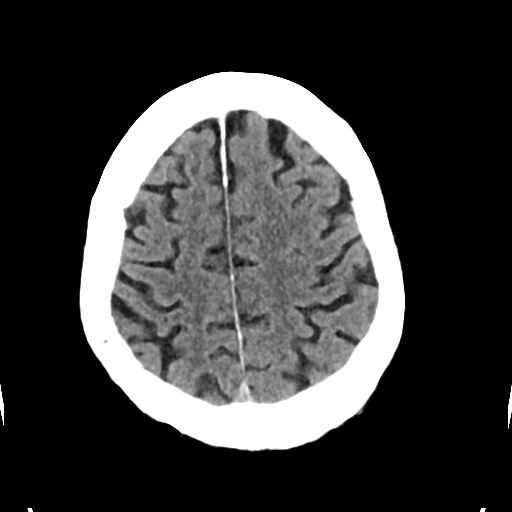
[im 26/30  brain]
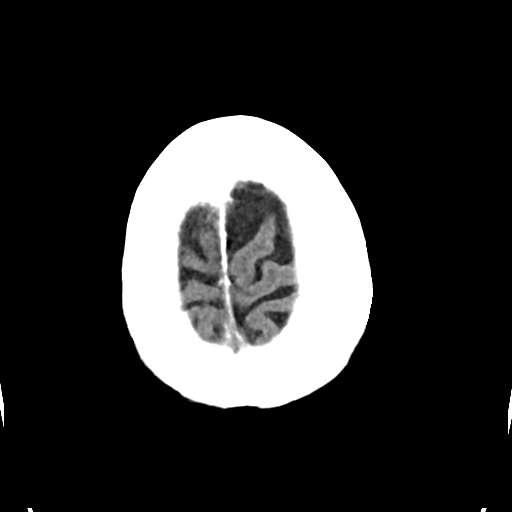

[Series 4: head bone · axial · 0.42mm/px · z∈[+1242,+1270]mm · 3 of 74 slices shown]
[im 8/74  bone]
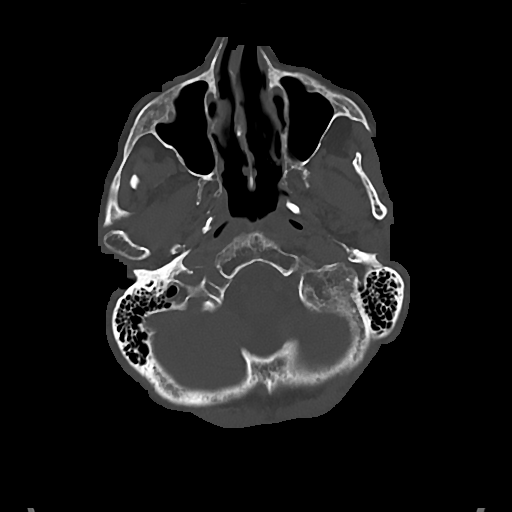
[im 15/74  bone]
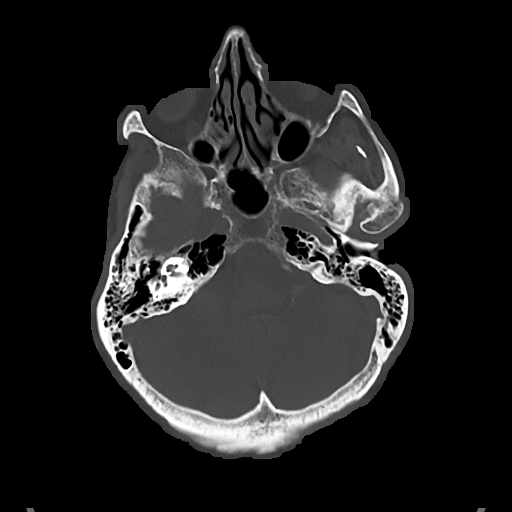
[im 22/74  bone]
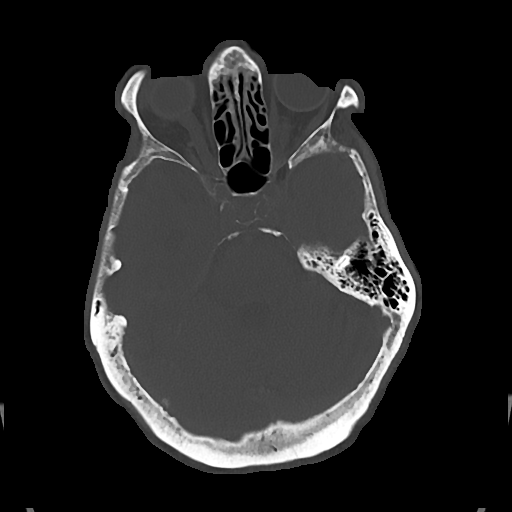

[Series 5: cor soft · coronal · 0.33mm/px · 3 of 64 slices shown]
[im 22/64  brain]
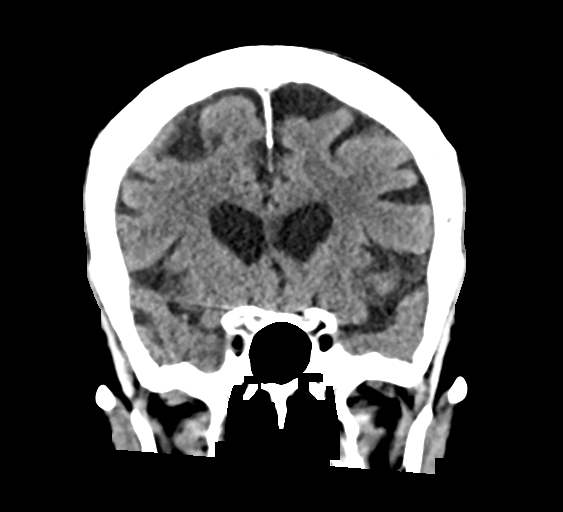
[im 29/64  brain]
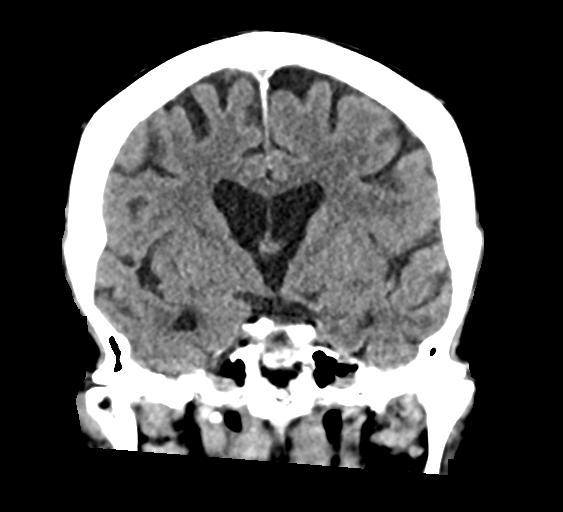
[im 36/64  brain]
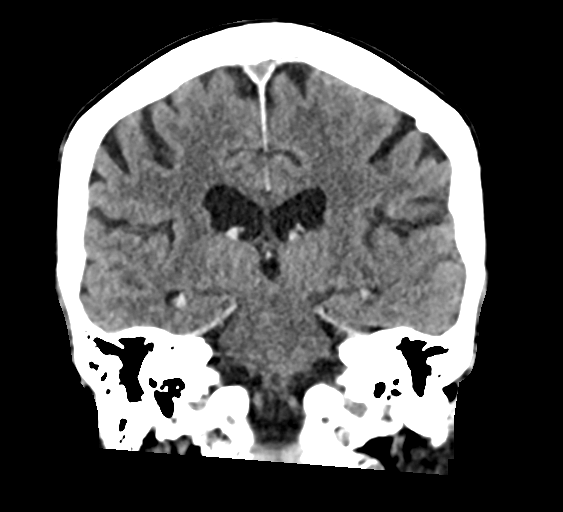

[Series 6: sag soft · sagittal · 0.32mm/px · 3 of 50 slices shown]
[im 17/50  brain]
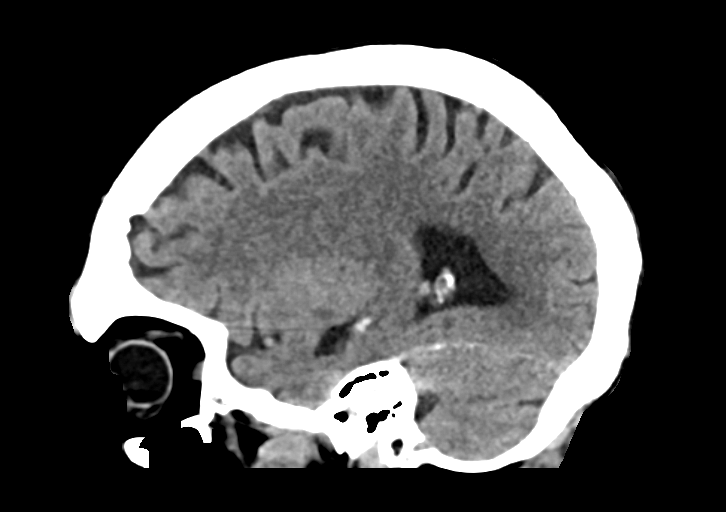
[im 25/50  brain]
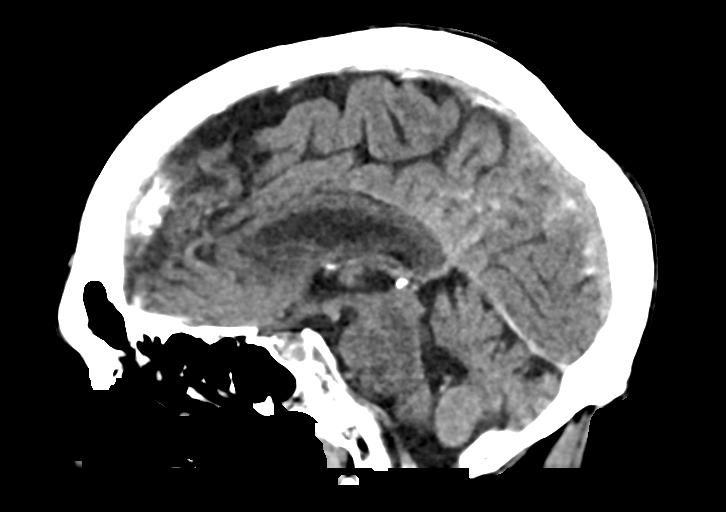
[im 33/50  brain]
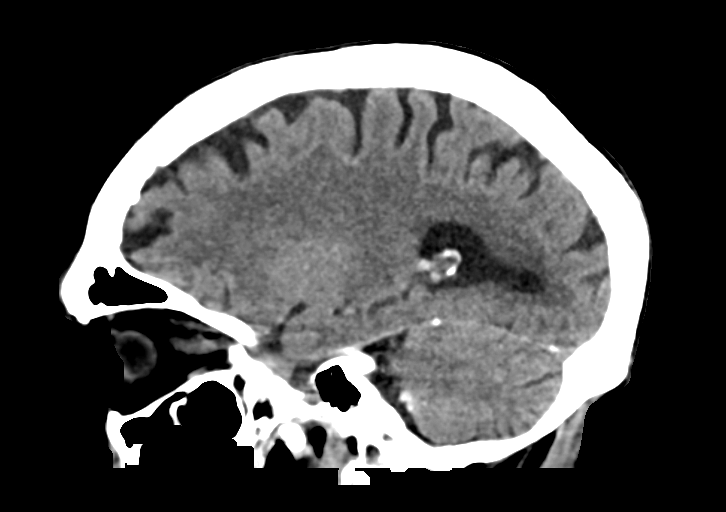

[16 of 47 positions shown; findings below may reference images not displayed]

FINDINGS: Brain: No evidence of acute infarction, hemorrhage, hydrocephalus,
extra-axial collection or mass lesion/mass effect.

There is ventricular sulcal enlargement reflecting age-appropriate
volume loss.

Vascular: No hyperdense vessel or unexpected calcification.

Skull: Normal. Negative for fracture or focal lesion.

Sinuses/Orbits: Globes and orbits are unremarkable. Minor ethmoid
sinus mucosal thickening. Remaining sinuses and mastoid air cells
are clear.

Other: None.
IMPRESSION: 1. No acute intracranial abnormalities.
2. Age-appropriate volume loss.
3. No skull fracture.

## 2020-01-07 IMAGING — CR DG HIP (WITH OR WITHOUT PELVIS) 5+V BILAT
5 series · 5 of 5 positions shown · non-contrast
Comparison: None.

CLINICAL DATA: Pt reports that she was bathing this am when left
knee gave out and caused pt to fall near the bathroom sink. C/o pain
in lower back as well as both hips and left knee. Hx of Total Hip on
right side and left femur sx per pt.

EXAM:
DG HIP (WITH OR WITHOUT PELVIS) 5+V BILAT

[pelvis ap]
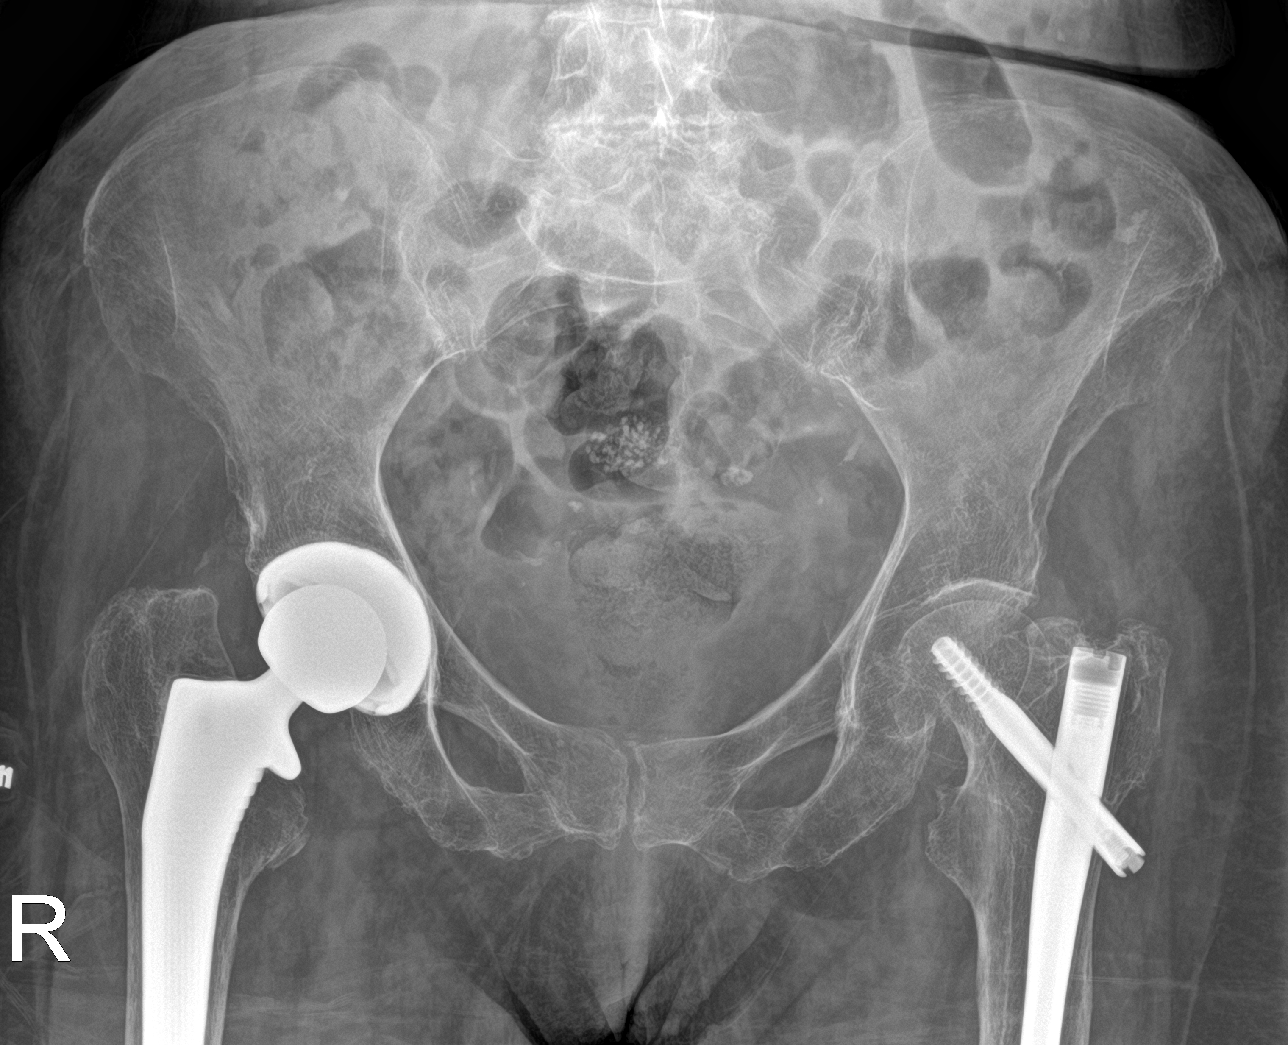

[hip ap (1 of 2)]
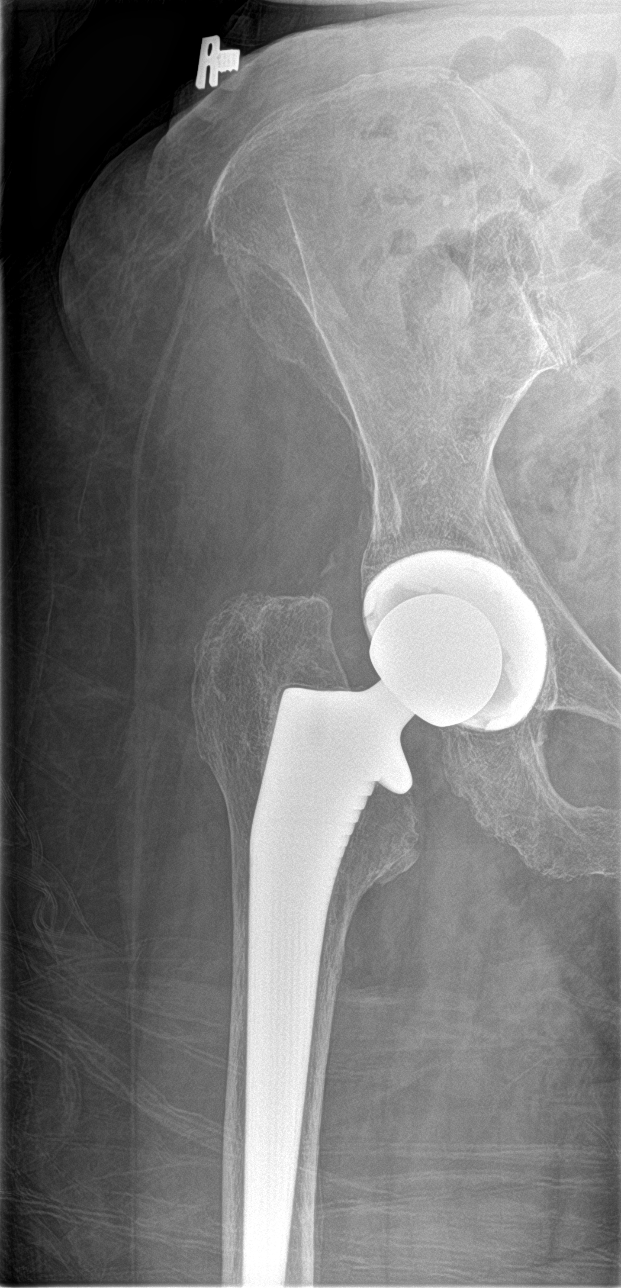

[hip ap (2 of 2)]
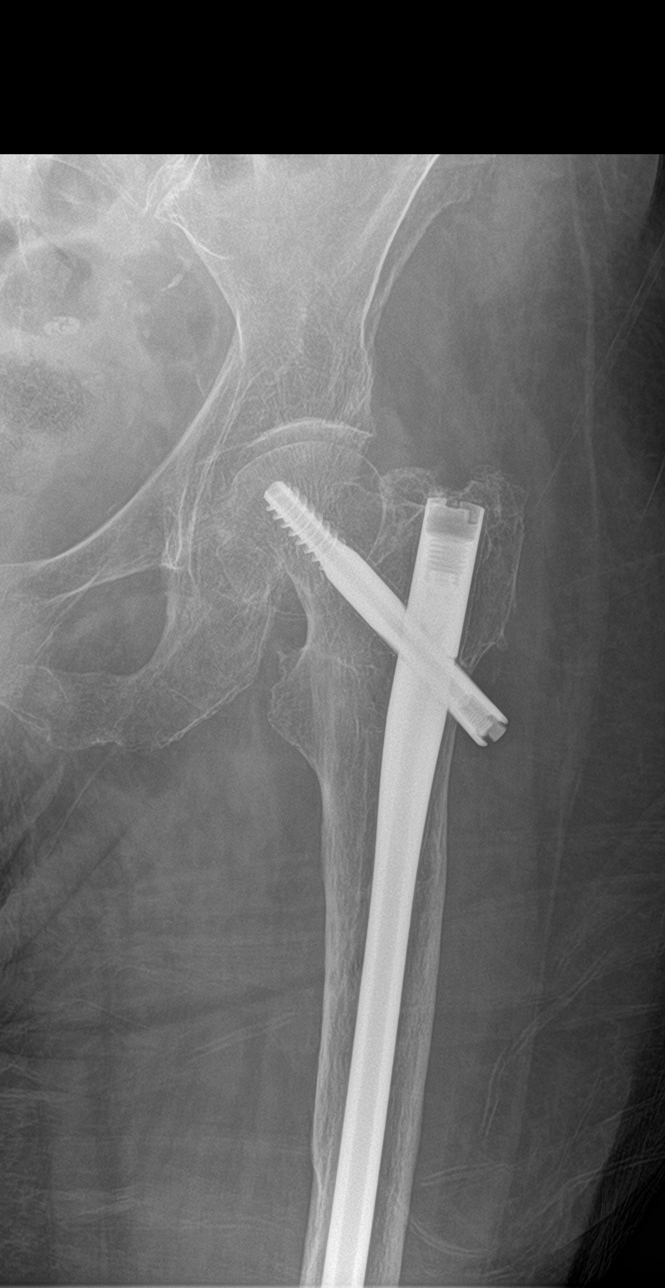

[hip lat (1 of 2)]
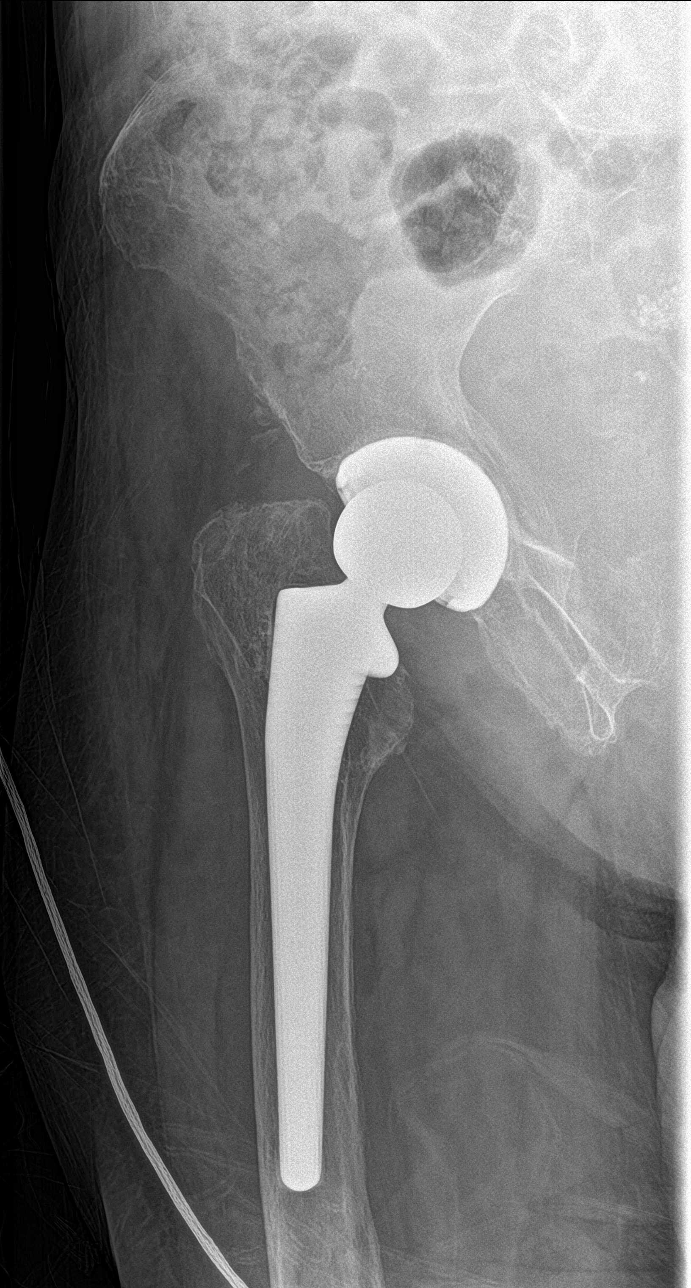

[hip lat (2 of 2)]
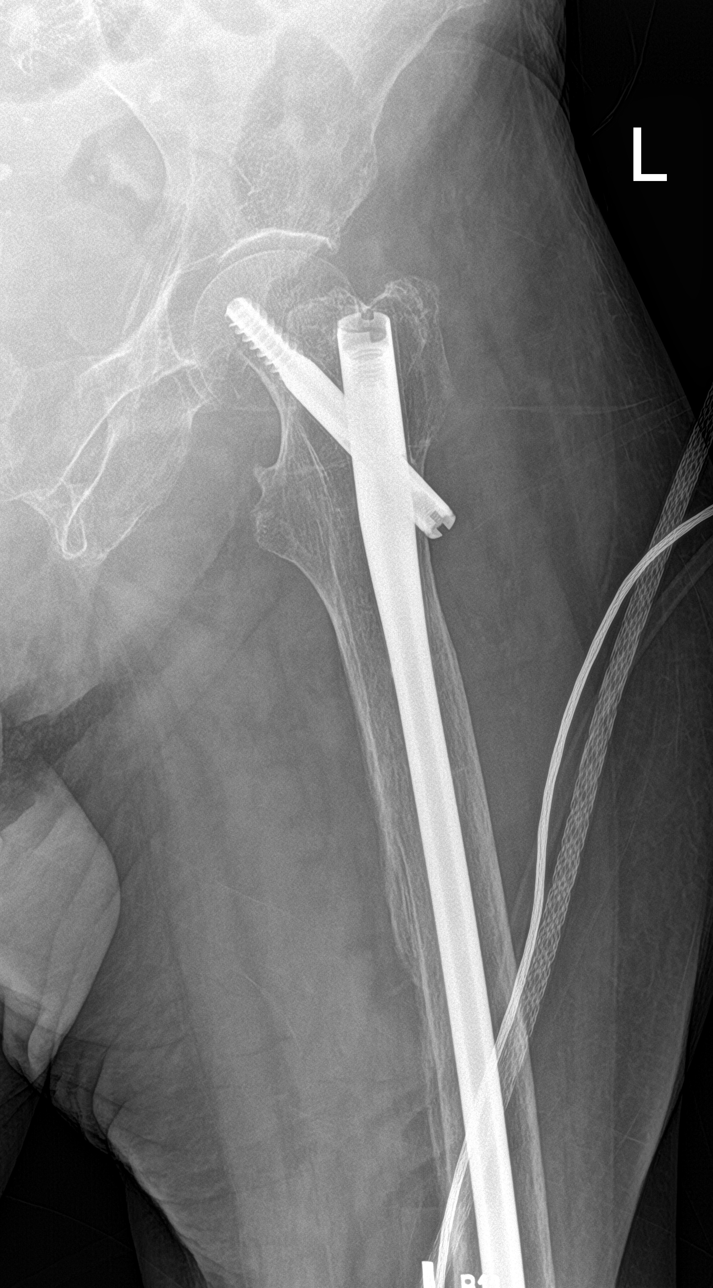

[5 of 5 positions shown; findings below may reference images not displayed]

FINDINGS: No acute fracture. Right hip total arthroplasty is well-seated and
aligned. Left hip compression screw and intramedullary rod appear
well seated with no evidence of loosening.

Left hip joint, SI joints and symphysis pubis are normally spaced
and aligned.

Bones are extensively demineralized.

Soft tissues are unremarkable.
IMPRESSION: 1. No fracture, dislocation or acute finding. No evidence of
loosening of the orthopedic hardware.

## 2020-01-16 ENCOUNTER — Encounter: Payer: Self-pay | Admitting: General Practice

## 2020-01-21 DIAGNOSIS — I509 Heart failure, unspecified: Secondary | ICD-10-CM | POA: Diagnosis not present

## 2020-01-21 DIAGNOSIS — R0602 Shortness of breath: Secondary | ICD-10-CM | POA: Diagnosis not present

## 2020-01-21 DIAGNOSIS — D649 Anemia, unspecified: Secondary | ICD-10-CM | POA: Diagnosis not present

## 2020-01-21 DIAGNOSIS — I5030 Unspecified diastolic (congestive) heart failure: Secondary | ICD-10-CM | POA: Diagnosis not present

## 2020-01-21 DIAGNOSIS — I10 Essential (primary) hypertension: Secondary | ICD-10-CM | POA: Diagnosis not present

## 2020-01-21 DIAGNOSIS — Z79899 Other long term (current) drug therapy: Secondary | ICD-10-CM | POA: Diagnosis not present

## 2020-01-21 DIAGNOSIS — I4891 Unspecified atrial fibrillation: Secondary | ICD-10-CM | POA: Diagnosis not present

## 2020-01-27 DIAGNOSIS — D649 Anemia, unspecified: Secondary | ICD-10-CM | POA: Diagnosis not present

## 2020-01-27 DIAGNOSIS — M549 Dorsalgia, unspecified: Secondary | ICD-10-CM | POA: Diagnosis not present

## 2020-01-27 DIAGNOSIS — I119 Hypertensive heart disease without heart failure: Secondary | ICD-10-CM | POA: Diagnosis not present

## 2020-01-27 DIAGNOSIS — J9611 Chronic respiratory failure with hypoxia: Secondary | ICD-10-CM | POA: Diagnosis not present

## 2020-02-04 DIAGNOSIS — R601 Generalized edema: Secondary | ICD-10-CM | POA: Diagnosis not present

## 2020-02-04 DIAGNOSIS — I4891 Unspecified atrial fibrillation: Secondary | ICD-10-CM | POA: Diagnosis not present

## 2020-02-06 DIAGNOSIS — G4700 Insomnia, unspecified: Secondary | ICD-10-CM | POA: Diagnosis not present

## 2020-02-18 DIAGNOSIS — I4891 Unspecified atrial fibrillation: Secondary | ICD-10-CM | POA: Diagnosis not present

## 2020-02-26 DIAGNOSIS — I4891 Unspecified atrial fibrillation: Secondary | ICD-10-CM | POA: Diagnosis not present

## 2020-02-26 DIAGNOSIS — R5381 Other malaise: Secondary | ICD-10-CM | POA: Diagnosis not present

## 2020-02-26 DIAGNOSIS — M810 Age-related osteoporosis without current pathological fracture: Secondary | ICD-10-CM | POA: Diagnosis not present

## 2020-02-26 DIAGNOSIS — E039 Hypothyroidism, unspecified: Secondary | ICD-10-CM | POA: Diagnosis not present

## 2020-03-03 DIAGNOSIS — I482 Chronic atrial fibrillation, unspecified: Secondary | ICD-10-CM | POA: Diagnosis not present

## 2020-03-09 ENCOUNTER — Ambulatory Visit (INDEPENDENT_AMBULATORY_CARE_PROVIDER_SITE_OTHER): Payer: Medicare Other | Admitting: *Deleted

## 2020-03-09 DIAGNOSIS — I4891 Unspecified atrial fibrillation: Secondary | ICD-10-CM | POA: Diagnosis not present

## 2020-03-09 LAB — CUP PACEART REMOTE DEVICE CHECK
Battery Remaining Longevity: 167 mo
Battery Voltage: 3.19 V
Brady Statistic AP VP Percent: 0 %
Brady Statistic AP VS Percent: 0 %
Brady Statistic AS VP Percent: 12.02 %
Brady Statistic AS VS Percent: 87.98 %
Brady Statistic RA Percent Paced: 0 %
Brady Statistic RV Percent Paced: 12.02 %
Date Time Interrogation Session: 20210510053914
Implantable Lead Implant Date: 20100409
Implantable Lead Implant Date: 20100409
Implantable Lead Location: 753859
Implantable Lead Location: 753860
Implantable Lead Model: 5076
Implantable Lead Model: 5092
Implantable Pulse Generator Implant Date: 20200917
Lead Channel Impedance Value: 2793 Ohm
Lead Channel Impedance Value: 380 Ohm
Lead Channel Impedance Value: 399 Ohm
Lead Channel Impedance Value: 494 Ohm
Lead Channel Pacing Threshold Amplitude: 1.25 V
Lead Channel Pacing Threshold Pulse Width: 0.4 ms
Lead Channel Sensing Intrinsic Amplitude: 0.5 mV
Lead Channel Sensing Intrinsic Amplitude: 7 mV
Lead Channel Sensing Intrinsic Amplitude: 7 mV
Lead Channel Setting Pacing Amplitude: 2.5 V
Lead Channel Setting Pacing Pulse Width: 0.4 ms
Lead Channel Setting Sensing Sensitivity: 2 mV

## 2020-03-10 NOTE — Progress Notes (Signed)
Remote pacemaker transmission.   

## 2020-03-17 DIAGNOSIS — I4891 Unspecified atrial fibrillation: Secondary | ICD-10-CM | POA: Diagnosis not present

## 2020-03-31 DIAGNOSIS — I4891 Unspecified atrial fibrillation: Secondary | ICD-10-CM | POA: Diagnosis not present

## 2020-04-28 DIAGNOSIS — S81801D Unspecified open wound, right lower leg, subsequent encounter: Secondary | ICD-10-CM | POA: Diagnosis not present

## 2020-05-05 DIAGNOSIS — S81801D Unspecified open wound, right lower leg, subsequent encounter: Secondary | ICD-10-CM | POA: Diagnosis not present

## 2020-05-12 DIAGNOSIS — I4891 Unspecified atrial fibrillation: Secondary | ICD-10-CM | POA: Diagnosis not present

## 2020-05-12 DIAGNOSIS — S81801D Unspecified open wound, right lower leg, subsequent encounter: Secondary | ICD-10-CM | POA: Diagnosis not present

## 2020-05-15 DIAGNOSIS — E876 Hypokalemia: Secondary | ICD-10-CM | POA: Diagnosis not present

## 2020-05-15 DIAGNOSIS — D649 Anemia, unspecified: Secondary | ICD-10-CM | POA: Diagnosis not present

## 2020-05-19 DIAGNOSIS — S81801D Unspecified open wound, right lower leg, subsequent encounter: Secondary | ICD-10-CM | POA: Diagnosis not present

## 2020-05-26 DIAGNOSIS — S81801D Unspecified open wound, right lower leg, subsequent encounter: Secondary | ICD-10-CM | POA: Diagnosis not present

## 2020-05-30 DIAGNOSIS — I4891 Unspecified atrial fibrillation: Secondary | ICD-10-CM | POA: Diagnosis not present

## 2020-05-30 DIAGNOSIS — M549 Dorsalgia, unspecified: Secondary | ICD-10-CM | POA: Diagnosis not present

## 2020-05-30 DIAGNOSIS — D649 Anemia, unspecified: Secondary | ICD-10-CM | POA: Diagnosis not present

## 2020-05-30 DIAGNOSIS — I119 Hypertensive heart disease without heart failure: Secondary | ICD-10-CM | POA: Diagnosis not present

## 2020-06-02 DIAGNOSIS — S81801D Unspecified open wound, right lower leg, subsequent encounter: Secondary | ICD-10-CM | POA: Diagnosis not present

## 2020-06-08 ENCOUNTER — Ambulatory Visit (INDEPENDENT_AMBULATORY_CARE_PROVIDER_SITE_OTHER): Payer: Medicare Other | Admitting: *Deleted

## 2020-06-08 DIAGNOSIS — I495 Sick sinus syndrome: Secondary | ICD-10-CM

## 2020-06-09 DIAGNOSIS — S81801D Unspecified open wound, right lower leg, subsequent encounter: Secondary | ICD-10-CM | POA: Diagnosis not present

## 2020-06-10 LAB — CUP PACEART REMOTE DEVICE CHECK
Battery Remaining Longevity: 165 mo
Battery Voltage: 3.16 V
Brady Statistic AP VP Percent: 0 %
Brady Statistic AP VS Percent: 0 %
Brady Statistic AS VP Percent: 17.67 %
Brady Statistic AS VS Percent: 82.33 %
Brady Statistic RA Percent Paced: 0 %
Brady Statistic RV Percent Paced: 17.67 %
Date Time Interrogation Session: 20210809032513
Implantable Lead Implant Date: 20100409
Implantable Lead Implant Date: 20100409
Implantable Lead Location: 753859
Implantable Lead Location: 753860
Implantable Lead Model: 5076
Implantable Lead Model: 5092
Implantable Pulse Generator Implant Date: 20200917
Lead Channel Impedance Value: 2793 Ohm
Lead Channel Impedance Value: 380 Ohm
Lead Channel Impedance Value: 418 Ohm
Lead Channel Impedance Value: 494 Ohm
Lead Channel Pacing Threshold Amplitude: 1.125 V
Lead Channel Pacing Threshold Pulse Width: 0.4 ms
Lead Channel Sensing Intrinsic Amplitude: 0.5 mV
Lead Channel Sensing Intrinsic Amplitude: 6.75 mV
Lead Channel Sensing Intrinsic Amplitude: 6.75 mV
Lead Channel Setting Pacing Amplitude: 2.5 V
Lead Channel Setting Pacing Pulse Width: 0.4 ms
Lead Channel Setting Sensing Sensitivity: 2 mV

## 2020-06-11 DIAGNOSIS — G4700 Insomnia, unspecified: Secondary | ICD-10-CM | POA: Diagnosis not present

## 2020-06-11 NOTE — Progress Notes (Signed)
Remote pacemaker transmission.   

## 2020-06-12 DIAGNOSIS — Z7901 Long term (current) use of anticoagulants: Secondary | ICD-10-CM | POA: Diagnosis not present

## 2020-06-12 DIAGNOSIS — I4891 Unspecified atrial fibrillation: Secondary | ICD-10-CM | POA: Diagnosis not present

## 2020-06-16 DIAGNOSIS — S81801D Unspecified open wound, right lower leg, subsequent encounter: Secondary | ICD-10-CM | POA: Diagnosis not present

## 2020-06-23 DIAGNOSIS — S81801D Unspecified open wound, right lower leg, subsequent encounter: Secondary | ICD-10-CM | POA: Diagnosis not present

## 2020-06-26 DIAGNOSIS — I4891 Unspecified atrial fibrillation: Secondary | ICD-10-CM | POA: Diagnosis not present

## 2020-06-26 DIAGNOSIS — Z7901 Long term (current) use of anticoagulants: Secondary | ICD-10-CM | POA: Diagnosis not present

## 2020-06-28 DIAGNOSIS — D649 Anemia, unspecified: Secondary | ICD-10-CM | POA: Diagnosis not present

## 2020-06-28 DIAGNOSIS — J9611 Chronic respiratory failure with hypoxia: Secondary | ICD-10-CM | POA: Diagnosis not present

## 2020-06-28 DIAGNOSIS — E039 Hypothyroidism, unspecified: Secondary | ICD-10-CM | POA: Diagnosis not present

## 2020-06-28 DIAGNOSIS — R5381 Other malaise: Secondary | ICD-10-CM | POA: Diagnosis not present

## 2020-06-30 DIAGNOSIS — S81801D Unspecified open wound, right lower leg, subsequent encounter: Secondary | ICD-10-CM | POA: Diagnosis not present

## 2020-07-07 DIAGNOSIS — S81801D Unspecified open wound, right lower leg, subsequent encounter: Secondary | ICD-10-CM | POA: Diagnosis not present

## 2020-07-10 DIAGNOSIS — L97509 Non-pressure chronic ulcer of other part of unspecified foot with unspecified severity: Secondary | ICD-10-CM | POA: Diagnosis not present

## 2020-07-10 DIAGNOSIS — I4891 Unspecified atrial fibrillation: Secondary | ICD-10-CM | POA: Diagnosis not present

## 2020-07-14 DIAGNOSIS — L988 Other specified disorders of the skin and subcutaneous tissue: Secondary | ICD-10-CM | POA: Diagnosis not present

## 2020-07-14 DIAGNOSIS — S81801D Unspecified open wound, right lower leg, subsequent encounter: Secondary | ICD-10-CM | POA: Diagnosis not present

## 2020-07-21 DIAGNOSIS — S81801D Unspecified open wound, right lower leg, subsequent encounter: Secondary | ICD-10-CM | POA: Diagnosis not present

## 2020-07-21 DIAGNOSIS — L988 Other specified disorders of the skin and subcutaneous tissue: Secondary | ICD-10-CM | POA: Diagnosis not present

## 2020-07-23 DIAGNOSIS — G4700 Insomnia, unspecified: Secondary | ICD-10-CM | POA: Diagnosis not present

## 2020-07-28 DIAGNOSIS — L988 Other specified disorders of the skin and subcutaneous tissue: Secondary | ICD-10-CM | POA: Diagnosis not present

## 2020-07-28 DIAGNOSIS — S81801D Unspecified open wound, right lower leg, subsequent encounter: Secondary | ICD-10-CM | POA: Diagnosis not present

## 2020-07-29 DIAGNOSIS — E441 Mild protein-calorie malnutrition: Secondary | ICD-10-CM | POA: Diagnosis not present

## 2020-07-29 DIAGNOSIS — I4891 Unspecified atrial fibrillation: Secondary | ICD-10-CM | POA: Diagnosis not present

## 2020-07-29 DIAGNOSIS — R5381 Other malaise: Secondary | ICD-10-CM | POA: Diagnosis not present

## 2020-07-29 DIAGNOSIS — I5032 Chronic diastolic (congestive) heart failure: Secondary | ICD-10-CM | POA: Diagnosis not present

## 2020-08-04 DIAGNOSIS — L988 Other specified disorders of the skin and subcutaneous tissue: Secondary | ICD-10-CM | POA: Diagnosis not present

## 2020-08-04 DIAGNOSIS — S81801D Unspecified open wound, right lower leg, subsequent encounter: Secondary | ICD-10-CM | POA: Diagnosis not present

## 2020-08-07 DIAGNOSIS — Z7901 Long term (current) use of anticoagulants: Secondary | ICD-10-CM | POA: Diagnosis not present

## 2020-08-11 DIAGNOSIS — S81801D Unspecified open wound, right lower leg, subsequent encounter: Secondary | ICD-10-CM | POA: Diagnosis not present

## 2020-08-11 DIAGNOSIS — L988 Other specified disorders of the skin and subcutaneous tissue: Secondary | ICD-10-CM | POA: Diagnosis not present

## 2020-08-18 DIAGNOSIS — L988 Other specified disorders of the skin and subcutaneous tissue: Secondary | ICD-10-CM | POA: Diagnosis not present

## 2020-08-18 DIAGNOSIS — S81801D Unspecified open wound, right lower leg, subsequent encounter: Secondary | ICD-10-CM | POA: Diagnosis not present

## 2020-08-21 DIAGNOSIS — I4891 Unspecified atrial fibrillation: Secondary | ICD-10-CM | POA: Diagnosis not present

## 2020-08-25 DIAGNOSIS — S81801D Unspecified open wound, right lower leg, subsequent encounter: Secondary | ICD-10-CM | POA: Diagnosis not present

## 2020-08-25 DIAGNOSIS — L988 Other specified disorders of the skin and subcutaneous tissue: Secondary | ICD-10-CM | POA: Diagnosis not present

## 2020-08-28 DIAGNOSIS — E039 Hypothyroidism, unspecified: Secondary | ICD-10-CM | POA: Diagnosis not present

## 2020-08-28 DIAGNOSIS — M810 Age-related osteoporosis without current pathological fracture: Secondary | ICD-10-CM | POA: Diagnosis not present

## 2020-08-28 DIAGNOSIS — I119 Hypertensive heart disease without heart failure: Secondary | ICD-10-CM | POA: Diagnosis not present

## 2020-08-28 DIAGNOSIS — K5900 Constipation, unspecified: Secondary | ICD-10-CM | POA: Diagnosis not present

## 2020-09-01 DIAGNOSIS — L988 Other specified disorders of the skin and subcutaneous tissue: Secondary | ICD-10-CM | POA: Diagnosis not present

## 2020-09-01 DIAGNOSIS — S81801D Unspecified open wound, right lower leg, subsequent encounter: Secondary | ICD-10-CM | POA: Diagnosis not present

## 2020-09-03 DIAGNOSIS — G4700 Insomnia, unspecified: Secondary | ICD-10-CM | POA: Diagnosis not present

## 2020-09-04 DIAGNOSIS — I4891 Unspecified atrial fibrillation: Secondary | ICD-10-CM | POA: Diagnosis not present

## 2020-09-07 ENCOUNTER — Ambulatory Visit (INDEPENDENT_AMBULATORY_CARE_PROVIDER_SITE_OTHER): Payer: Medicare Other

## 2020-09-07 DIAGNOSIS — I4891 Unspecified atrial fibrillation: Secondary | ICD-10-CM

## 2020-09-08 DIAGNOSIS — S81801D Unspecified open wound, right lower leg, subsequent encounter: Secondary | ICD-10-CM | POA: Diagnosis not present

## 2020-09-08 DIAGNOSIS — L988 Other specified disorders of the skin and subcutaneous tissue: Secondary | ICD-10-CM | POA: Diagnosis not present

## 2020-09-08 LAB — CUP PACEART REMOTE DEVICE CHECK
Battery Remaining Longevity: 163 mo
Battery Voltage: 3.12 V
Brady Statistic AP VP Percent: 0 %
Brady Statistic AP VS Percent: 0 %
Brady Statistic AS VP Percent: 15.68 %
Brady Statistic AS VS Percent: 84.32 %
Brady Statistic RA Percent Paced: 0 %
Brady Statistic RV Percent Paced: 15.68 %
Date Time Interrogation Session: 20211108021051
Implantable Lead Implant Date: 20100409
Implantable Lead Implant Date: 20100409
Implantable Lead Location: 753859
Implantable Lead Location: 753860
Implantable Lead Model: 5076
Implantable Lead Model: 5092
Implantable Pulse Generator Implant Date: 20200917
Lead Channel Impedance Value: 2793 Ohm
Lead Channel Impedance Value: 380 Ohm
Lead Channel Impedance Value: 437 Ohm
Lead Channel Impedance Value: 532 Ohm
Lead Channel Pacing Threshold Amplitude: 1 V
Lead Channel Pacing Threshold Pulse Width: 0.4 ms
Lead Channel Sensing Intrinsic Amplitude: 0.5 mV
Lead Channel Sensing Intrinsic Amplitude: 9.875 mV
Lead Channel Sensing Intrinsic Amplitude: 9.875 mV
Lead Channel Setting Pacing Amplitude: 2.5 V
Lead Channel Setting Pacing Pulse Width: 0.4 ms
Lead Channel Setting Sensing Sensitivity: 2 mV

## 2020-09-09 NOTE — Progress Notes (Signed)
Remote pacemaker transmission.   

## 2020-09-14 DIAGNOSIS — J9690 Respiratory failure, unspecified, unspecified whether with hypoxia or hypercapnia: Secondary | ICD-10-CM | POA: Diagnosis not present

## 2020-09-14 DIAGNOSIS — I4891 Unspecified atrial fibrillation: Secondary | ICD-10-CM | POA: Diagnosis not present

## 2020-09-14 DIAGNOSIS — R41841 Cognitive communication deficit: Secondary | ICD-10-CM | POA: Diagnosis not present

## 2020-09-15 DIAGNOSIS — I4891 Unspecified atrial fibrillation: Secondary | ICD-10-CM | POA: Diagnosis not present

## 2020-09-15 DIAGNOSIS — L988 Other specified disorders of the skin and subcutaneous tissue: Secondary | ICD-10-CM | POA: Diagnosis not present

## 2020-09-15 DIAGNOSIS — R41841 Cognitive communication deficit: Secondary | ICD-10-CM | POA: Diagnosis not present

## 2020-09-15 DIAGNOSIS — J9690 Respiratory failure, unspecified, unspecified whether with hypoxia or hypercapnia: Secondary | ICD-10-CM | POA: Diagnosis not present

## 2020-09-15 DIAGNOSIS — S81801D Unspecified open wound, right lower leg, subsequent encounter: Secondary | ICD-10-CM | POA: Diagnosis not present

## 2020-09-17 DIAGNOSIS — I4891 Unspecified atrial fibrillation: Secondary | ICD-10-CM | POA: Diagnosis not present

## 2020-09-17 DIAGNOSIS — J9690 Respiratory failure, unspecified, unspecified whether with hypoxia or hypercapnia: Secondary | ICD-10-CM | POA: Diagnosis not present

## 2020-09-17 DIAGNOSIS — R41841 Cognitive communication deficit: Secondary | ICD-10-CM | POA: Diagnosis not present

## 2020-09-18 DIAGNOSIS — I4891 Unspecified atrial fibrillation: Secondary | ICD-10-CM | POA: Diagnosis not present

## 2020-09-21 DIAGNOSIS — I4891 Unspecified atrial fibrillation: Secondary | ICD-10-CM | POA: Diagnosis not present

## 2020-09-21 DIAGNOSIS — J9690 Respiratory failure, unspecified, unspecified whether with hypoxia or hypercapnia: Secondary | ICD-10-CM | POA: Diagnosis not present

## 2020-09-21 DIAGNOSIS — R41841 Cognitive communication deficit: Secondary | ICD-10-CM | POA: Diagnosis not present

## 2020-09-22 DIAGNOSIS — I4891 Unspecified atrial fibrillation: Secondary | ICD-10-CM | POA: Diagnosis not present

## 2020-09-22 DIAGNOSIS — L988 Other specified disorders of the skin and subcutaneous tissue: Secondary | ICD-10-CM | POA: Diagnosis not present

## 2020-09-22 DIAGNOSIS — R41841 Cognitive communication deficit: Secondary | ICD-10-CM | POA: Diagnosis not present

## 2020-09-22 DIAGNOSIS — S81801D Unspecified open wound, right lower leg, subsequent encounter: Secondary | ICD-10-CM | POA: Diagnosis not present

## 2020-09-22 DIAGNOSIS — J9690 Respiratory failure, unspecified, unspecified whether with hypoxia or hypercapnia: Secondary | ICD-10-CM | POA: Diagnosis not present

## 2020-09-23 DIAGNOSIS — I4891 Unspecified atrial fibrillation: Secondary | ICD-10-CM | POA: Diagnosis not present

## 2020-09-23 DIAGNOSIS — R41841 Cognitive communication deficit: Secondary | ICD-10-CM | POA: Diagnosis not present

## 2020-09-23 DIAGNOSIS — J9690 Respiratory failure, unspecified, unspecified whether with hypoxia or hypercapnia: Secondary | ICD-10-CM | POA: Diagnosis not present

## 2020-09-28 DIAGNOSIS — D649 Anemia, unspecified: Secondary | ICD-10-CM | POA: Diagnosis not present

## 2020-09-28 DIAGNOSIS — M1990 Unspecified osteoarthritis, unspecified site: Secondary | ICD-10-CM | POA: Diagnosis not present

## 2020-09-28 DIAGNOSIS — J9690 Respiratory failure, unspecified, unspecified whether with hypoxia or hypercapnia: Secondary | ICD-10-CM | POA: Diagnosis not present

## 2020-09-28 DIAGNOSIS — I4891 Unspecified atrial fibrillation: Secondary | ICD-10-CM | POA: Diagnosis not present

## 2020-09-28 DIAGNOSIS — M549 Dorsalgia, unspecified: Secondary | ICD-10-CM | POA: Diagnosis not present

## 2020-09-28 DIAGNOSIS — R41841 Cognitive communication deficit: Secondary | ICD-10-CM | POA: Diagnosis not present

## 2020-09-28 DIAGNOSIS — E441 Mild protein-calorie malnutrition: Secondary | ICD-10-CM | POA: Diagnosis not present

## 2020-09-29 DIAGNOSIS — L988 Other specified disorders of the skin and subcutaneous tissue: Secondary | ICD-10-CM | POA: Diagnosis not present

## 2020-09-29 DIAGNOSIS — J9690 Respiratory failure, unspecified, unspecified whether with hypoxia or hypercapnia: Secondary | ICD-10-CM | POA: Diagnosis not present

## 2020-09-29 DIAGNOSIS — I4891 Unspecified atrial fibrillation: Secondary | ICD-10-CM | POA: Diagnosis not present

## 2020-09-29 DIAGNOSIS — S81801D Unspecified open wound, right lower leg, subsequent encounter: Secondary | ICD-10-CM | POA: Diagnosis not present

## 2020-09-29 DIAGNOSIS — R41841 Cognitive communication deficit: Secondary | ICD-10-CM | POA: Diagnosis not present

## 2020-10-02 DIAGNOSIS — R41841 Cognitive communication deficit: Secondary | ICD-10-CM | POA: Diagnosis not present

## 2020-10-02 DIAGNOSIS — Z7901 Long term (current) use of anticoagulants: Secondary | ICD-10-CM | POA: Diagnosis not present

## 2020-10-02 DIAGNOSIS — I4891 Unspecified atrial fibrillation: Secondary | ICD-10-CM | POA: Diagnosis not present

## 2020-10-02 DIAGNOSIS — J9690 Respiratory failure, unspecified, unspecified whether with hypoxia or hypercapnia: Secondary | ICD-10-CM | POA: Diagnosis not present

## 2020-10-05 ENCOUNTER — Encounter: Payer: Medicare Other | Admitting: Physician Assistant

## 2020-10-06 DIAGNOSIS — J9690 Respiratory failure, unspecified, unspecified whether with hypoxia or hypercapnia: Secondary | ICD-10-CM | POA: Diagnosis not present

## 2020-10-06 DIAGNOSIS — I4891 Unspecified atrial fibrillation: Secondary | ICD-10-CM | POA: Diagnosis not present

## 2020-10-06 DIAGNOSIS — S81801D Unspecified open wound, right lower leg, subsequent encounter: Secondary | ICD-10-CM | POA: Diagnosis not present

## 2020-10-06 DIAGNOSIS — R41841 Cognitive communication deficit: Secondary | ICD-10-CM | POA: Diagnosis not present

## 2020-10-06 DIAGNOSIS — L988 Other specified disorders of the skin and subcutaneous tissue: Secondary | ICD-10-CM | POA: Diagnosis not present

## 2020-10-08 DIAGNOSIS — J9690 Respiratory failure, unspecified, unspecified whether with hypoxia or hypercapnia: Secondary | ICD-10-CM | POA: Diagnosis not present

## 2020-10-08 DIAGNOSIS — R41841 Cognitive communication deficit: Secondary | ICD-10-CM | POA: Diagnosis not present

## 2020-10-08 DIAGNOSIS — I4891 Unspecified atrial fibrillation: Secondary | ICD-10-CM | POA: Diagnosis not present

## 2020-10-12 DIAGNOSIS — J9690 Respiratory failure, unspecified, unspecified whether with hypoxia or hypercapnia: Secondary | ICD-10-CM | POA: Diagnosis not present

## 2020-10-12 DIAGNOSIS — R41841 Cognitive communication deficit: Secondary | ICD-10-CM | POA: Diagnosis not present

## 2020-10-12 DIAGNOSIS — I4891 Unspecified atrial fibrillation: Secondary | ICD-10-CM | POA: Diagnosis not present

## 2020-10-13 DIAGNOSIS — S81801D Unspecified open wound, right lower leg, subsequent encounter: Secondary | ICD-10-CM | POA: Diagnosis not present

## 2020-10-13 DIAGNOSIS — L988 Other specified disorders of the skin and subcutaneous tissue: Secondary | ICD-10-CM | POA: Diagnosis not present

## 2020-10-14 DIAGNOSIS — R41841 Cognitive communication deficit: Secondary | ICD-10-CM | POA: Diagnosis not present

## 2020-10-14 DIAGNOSIS — J9690 Respiratory failure, unspecified, unspecified whether with hypoxia or hypercapnia: Secondary | ICD-10-CM | POA: Diagnosis not present

## 2020-10-14 DIAGNOSIS — I4891 Unspecified atrial fibrillation: Secondary | ICD-10-CM | POA: Diagnosis not present

## 2020-10-15 DIAGNOSIS — G4700 Insomnia, unspecified: Secondary | ICD-10-CM | POA: Diagnosis not present

## 2020-10-15 NOTE — Progress Notes (Signed)
Cardiology Office Note Date:  10/16/2020  Patient ID:  Patricia Holt, Patricia Holt 1924-09-23, MRN [id number] PCP:  Lavone Orn, MD  Cardiologist/Electrophysiologist: Dr. Lovena Le    Chief Complaint: annual visit  History of Present Illness: Patricia Holt is a 86 y.o. female with history of symptomatic bradycardia w/PPM, permanent AFib, HTN, HLD, hypothyroidism, GERD, RA.  She comes in today to be seen for Dr. Lovena Le, last seen by him via tele health visit Dec 2020.  At that time, doing well, remained active despite her age, though admitted to slowing some.  No changes were made.  TODAY She resides at [facility] and is accompanied by a staff member from there today.  He is not overly familiar with the patient/her case, but was not made ware of any particular concerns or needs that was relayed to him. The patient is AAO self, knows she lives in the [place], is wrong on date/president, though otherwise answers questions appropriately and is very pleasant and communicative/converscent. She denies any symptoms of CP, palpitations or cardiac awareness. She is in a wheelchair she states 2/2 falls, denies fainting. She wears O2 chronically and says she feels like she breaths fine, denies waling SOB. She has bandages b/l lower legs, states her toenails scratch her legs and has some small wounds. She denies any bleeding or signs of bleeding Reports getting labs at the [facility].  [facility] staff member, states that they do have physicians/medical staff and lab capabilities there.    Device information MDT dual chamber PPM implanted 02/06/2009, gen change Sept  Programed VVI  AFib hx Diagnosed 2009 >> permanent/rate control strategy  Past Medical History:  Diagnosis Date  . Age-related macular degeneration, dry, left eye   . Age-related macular degeneration, wet, right eye ([place])   . Anxiety    "maybe occasionally" (09/11/2018)  . Aortic valve disorder   . Atrial fibrillation (HCC)    paroxysmal,  status post successful vessel cardioversion 5/09, skains,transient left ventricular systolic dysfunction, likely secondary to cardioversion/stunning of the myocardium, no evidence significant CAD, echo 7/09 shows normal ejection fraction  . Carpal tunnel syndrome   . Chronic anticoagulation   . DJD (degenerative joint disease)   . Dysrhythmia   . GERD (gastroesophageal reflux disease)    OTC occasionally  . Goiter   . Heart murmur   . History of blood transfusion    "related to femur OR" (09/11/2018)  . HTN (hypertension)   . Hyperlipidemia    "hx" (09/11/2018)  . Hypothyroidism   . Macular degeneration   . Mitral regurgitation   . Osteoporosis    , severe, hypercalciuria, [place] since 2005, T10 compression fracture 2009  . Other disorder of calcium metabolism   . Polymyalgia rheumatica ([place])   . Presence of permanent cardiac pacemaker   . Rheumatoid arthritis ([place])    "pretty much all over" (09/11/2018)  . Sinoatrial node dysfunction (HCC)   . Sinus drainage    in the morning, clear drainage  . Skin cancer    "nose and forehead/eyebrow" (09/11/2018)    Past Surgical History:  Procedure Laterality Date  . CARDIAC CATHETERIZATION  2000s  . CARPAL TUNNEL RELEASE Bilateral   . CATARACT EXTRACTION W/ INTRAOCULAR LENS  IMPLANT, BILATERAL Bilateral   . DILATION AND CURETTAGE OF UTERUS  X 3  . FEMUR FRACTURE SURGERY Left   . FRACTURE SURGERY    . INSERT / REPLACE / REMOVE PACEMAKER     Dual chamber permanent pacer insertion for tachy brady  syndrome, 01/2009  . JOINT REPLACEMENT    . PPM GENERATOR CHANGEOUT N/A 07/18/2019   Procedure: PPM GENERATOR CHANGEOUT;  Surgeon: Evans Lance, MD;  Location: [place] [facility];  Service: Cardiovascular;  Laterality: N/A;  . SKIN CANCER EXCISION     "nose and forehead/eyebrow"  . TONSILLECTOMY AND ADENOIDECTOMY    . TOTAL HIP ARTHROPLASTY Right 12/16/2014   Procedure: RIGHT TOTAL HIP ARTHROPLASTY ANTERIOR APPROACH;  Surgeon: Hessie Dibble, MD;  Location: [facility];  Service: Orthopedics;  Laterality: Right;  . TOTAL KNEE ARTHROPLASTY Right     Current Outpatient Medications  Medication Sig Dispense Refill  . acetaminophen (TYLENOL) 325 MG tablet Take 975 mg by mouth every 8 (eight) hours.    Marland Kitchen alum & mag hydroxide-simeth (MAALOX/MYLANTA) 200-200-20 MG/5ML suspension Take 30 mLs by mouth every 4 (four) hours as needed for indigestion or heartburn.    Marland Kitchen azelastine (ASTELIN) 0.1 % nasal spray Place 2 sprays into both nostrils 2 (two) times daily.  5  . bisacodyl (DULCOLAX) 10 MG suppository Place 10 mg rectally daily as needed for moderate constipation.    . calcium carbonate (OS-CAL - DOSED IN MG OF ELEMENTAL CALCIUM) 1250 (500 Ca) MG tablet Take 1 tablet by mouth 2 (two) times daily.    . cyclobenzaprine (FLEXERIL) 5 MG tablet Take 1 tablet (5 mg total) by mouth 3 (three) times daily as needed for muscle spasms. 10 tablet 0  . diclofenac sodium (VOLTAREN) 1 % GEL Apply 2 g topically every 12 (twelve) hours as needed (knee pain).    Marland Kitchen DILT-XR 180 MG 24 hr capsule Take 180 mg by mouth daily.  10  . furosemide (LASIX) 80 MG tablet Take 80 mg by mouth 2 (two) times daily.    Marland Kitchen gabapentin (NEURONTIN) 100 MG capsule Take 200 mg by mouth 2 (two) times daily.    Marland Kitchen guaifenesin (ROBITUSSIN) 100 MG/5ML syrup Take 300 mg by mouth every 4 (four) hours as needed for cough.    Marland Kitchen ipratropium-albuterol (DUONEB) 0.5-2.5 (3) MG/3ML SOLN Take 3 mLs by nebulization every 2 (two) hours as needed (shortness of breath).    Marland Kitchen levothyroxine (SYNTHROID, LEVOTHROID) 25 MCG tablet TAKE 1 TABLET BY MOUTH DAILY 30 tablet 3  . magnesium hydroxide (MILK OF MAGNESIA) 400 MG/5ML suspension Take 30 mLs by mouth daily as needed for mild constipation.    . Melatonin 5 MG CAPS Take 10 mg by mouth at bedtime.    . metolazone (ZAROXOLYN) 5 MG tablet Take 5 mg by mouth. Mon, Wed and Fri    . metoprolol succinate (TOPROL-XL) 50 MG 24 hr tablet Take 50 mg by mouth  daily. Hold if SBP <= to 100 and or/ pulse <=60    . omeprazole (PRILOSEC) 20 MG capsule Take 20 mg by mouth daily.    . ondansetron (ZOFRAN) 4 MG tablet Take 4 mg by mouth every 6 (six) hours as needed for nausea or vomiting.    Marland Kitchen OVER THE COUNTER MEDICATION Take 30 mLs by mouth every 4 (four) hours as needed (nasal drip). Tylenol Cold - Mucous Severe (dextromethorphan, acetaminophen, phenylephrine, guaifenesin)    . polyethylene glycol (MIRALAX / GLYCOLAX) 17 g packet Take 17 g by mouth 2 (two) times daily.    . potassium chloride SA (K-DUR) 20 MEQ tablet Take 20 mEq by mouth 3 (three) times daily.    . promethazine (PHENERGAN) 25 MG/ML injection Inject 12.5 mg into the muscle every 6 (six) hours as needed for  nausea or vomiting.    . senna (SENOKOT) 8.6 MG TABS tablet Take 2 tablets by mouth at bedtime.    . traMADol (ULTRAM) 50 MG tablet Take 50 mg by mouth every 6 (six) hours as needed for moderate pain or severe pain.    . vitamin B-12 (CYANOCOBALAMIN) 1000 MCG tablet Take 2,000 mcg by mouth every evening.    . warfarin (COUMADIN) 2.5 MG tablet Take 2.5 mg by mouth See admin instructions. Alternate taking 2.5 mg in the afternoon one day and 3 mg the next    . warfarin (COUMADIN) 3 MG tablet Take 3 mg by mouth See admin instructions. Alternate taking 3 mg in the afternoon one day and 2.5 mg the next    . cholecalciferol (VITAMIN D3) 25 MCG (1000 UT) tablet Take 2,000 Units by mouth daily.     No current facility-administered medications for this visit.    Allergies:   Patient has no known allergies.   Social History:  The patient  reports that she has never smoked. She has never used smokeless tobacco. She reports current alcohol use. She reports that she does not use drugs.   Family History:  The patient's family history includes Cancer in her sister; Diabetes in her sister; Hypertension in her father and mother.  ROS:  Please see the history of present illness.    All other systems are  reviewed and otherwise negative.   PHYSICAL EXAM:  VS:  BP 118/72   Pulse 67   Ht 5\' 7"  (1.702 m)   Wt 146 lb 9.6 oz (66.5 kg)   LMP  (LMP Unknown)   SpO2 95%   BMI 22.96 kg/m  BMI: Body mass index is 22.96 kg/m. Well nourished, well developed, in no acute distress, appears her age, thin, somewhat chronically ill appearing HEENT: normocephalic, atraumatic Neck: no JVD, carotid bruits or masses Cardiac:  RRR; no significant murmurs, no rubs, or gallops Lungs:  CTA b/l, no wheezing, rhonchi or rales Abd: soft, nontender MS: no deformity, age appropriate atrophy Ext: chronic looking skin changes with what appears some degree of dependent rubor, trace edema b/l, c/l bandages lower legs, are clean and dry Skin: warm and dry, no rash Neuro:  No gross deficits appreciated Psych: euthymic mood, full affect  PPM site is stable, no tethering or discomfort   EKG:  Done today and reviewed by myself shows  AFib, V paced  Device interrogation done today and reviewed by myself:  Battery and lead measurements are good Programmed VVIR VT episodes noted, available EGMs are reviewed and are irregular and felt to be AFib w/RVR/FVR Histograms note the vast majority of her HR <110 VP 20.8%   05/15/2018: TTE Study Conclusions  - Left ventricle: The cavity size was normal. Wall thickness was  normal. Systolic function was normal. The estimated ejection  fraction was in the range of 55% to 60%. Wall motion was normal;  there were no regional wall motion abnormalities.  - Aortic valve: There was moderate regurgitation.  - Mitral valve: Calcified annulus. Mildly thickened leaflets .  Systolic bowing without prolapse. There was mild to moderate  regurgitation directed centrally.  - Left atrium: The atrium was moderately dilated.  - Right ventricle: The cavity size was mildly dilated. Systolic  function was mildly reduced.  - Right atrium: The atrium was mildly dilated.  - Tricuspid  valve: There was mild-moderate regurgitation.  - Pulmonary arteries: Systolic pressure was moderately increased.  PA peak pressure: 61 mm Hg (  S).    Recent Labs: No results found for requested labs within last 8760 hours.  No results found for requested labs within last 8760 hours.   CrCl cannot be calculated (Patient's most recent lab result is older than the maximum 21 days allowed.).   Wt Readings from Last 3 Encounters:  10/16/20 146 lb 9.6 oz (66.5 kg)  07/18/19 143 lb (64.9 kg)  09/08/18 147 lb (66.7 kg)     Other studies reviewed: Additional studies/records reviewed today include: summarized above  ASSESSMENT AND PLAN:  1. PPM     Intact function, no programming changes made  2. Permanent AFib     CHA2DS2Vasc is 5, on  warfarin, managed by SNF providers (appears to ave monthly INRs on her MAR ordered)     Histograms look OK     No changes  3. HTN     Looks ok, no changes   4. In review of her SNF MAR, listed is chronic CHF (diastolic)      She has some mild edema today     Wears O2 chronically     On lasix and 3x/week metolazone     Presume labs are being monitored via her primary care team vi SNF    Disposition: F/u with remotes as usual Q 51mo, and in clinic in a year, sooner if needed.    Current medicines are reviewed at length with the patient today.  The patient did not have any concerns regarding medicines.  Venetia Night, PA-C 10/16/2020 1:20 PM     [place]  [id number] [phone] (office)  [phone] (fax)

## 2020-10-16 ENCOUNTER — Encounter: Payer: Self-pay | Admitting: Physician Assistant

## 2020-10-16 ENCOUNTER — Ambulatory Visit (INDEPENDENT_AMBULATORY_CARE_PROVIDER_SITE_OTHER): Payer: Medicare Other | Admitting: Physician Assistant

## 2020-10-16 ENCOUNTER — Other Ambulatory Visit: Payer: Self-pay

## 2020-10-16 VITALS — BP 118/72 | HR 67 | Ht 67.0 in | Wt 146.6 lb

## 2020-10-16 DIAGNOSIS — I4891 Unspecified atrial fibrillation: Secondary | ICD-10-CM

## 2020-10-16 DIAGNOSIS — Z950 Presence of cardiac pacemaker: Secondary | ICD-10-CM | POA: Diagnosis not present

## 2020-10-16 DIAGNOSIS — I495 Sick sinus syndrome: Secondary | ICD-10-CM | POA: Diagnosis not present

## 2020-10-16 DIAGNOSIS — R41841 Cognitive communication deficit: Secondary | ICD-10-CM | POA: Diagnosis not present

## 2020-10-16 DIAGNOSIS — I482 Chronic atrial fibrillation, unspecified: Secondary | ICD-10-CM | POA: Diagnosis not present

## 2020-10-16 DIAGNOSIS — I4821 Permanent atrial fibrillation: Secondary | ICD-10-CM | POA: Diagnosis not present

## 2020-10-16 DIAGNOSIS — I5032 Chronic diastolic (congestive) heart failure: Secondary | ICD-10-CM

## 2020-10-16 DIAGNOSIS — J9690 Respiratory failure, unspecified, unspecified whether with hypoxia or hypercapnia: Secondary | ICD-10-CM | POA: Diagnosis not present

## 2020-10-16 NOTE — Patient Instructions (Addendum)
Medication Instructions:   Your physician recommends that you continue on your current medications as directed. Please refer to the Current Medication list given to you today.   *If you need a refill on your cardiac medications before your next appointment, please call your pharmacy*   Lab Work: [place]   If you have labs (blood work) drawn today and your tests are completely normal, you will receive your results only by: Marland Kitchen MyChart Message (if you have MyChart) OR . A paper copy in the mail If you have any lab test that is abnormal or we need to change your treatment, we will call you to review the results.   Testing/Procedures: NONE ORDERED  TODAY   Follow-Up: At [facility], you and your health needs are our priority.  As part of our continuing mission to provide you with exceptional heart care, we have created designated Provider Care Teams.  These Care Teams include your primary Cardiologist (physician) and Advanced Practice Providers (APPs -  Physician Assistants and Nurse Practitioners) who all work together to provide you with the care you need, when you need it.  We recommend signing up for the patient portal called "MyChart".  Sign up information is provided on this After Visit Summary.  MyChart is used to connect with patients for Virtual Visits (Telemedicine).  Patients are able to view lab/test results, encounter notes, upcoming appointments, etc.  Non-urgent messages can be sent to your provider as well.   To learn more about what you can do with MyChart, go to [email or web address].    Your next appointment:   1 year   The format for your next appointment:   In Person  Provider:   You may see Dr. Lovena Le   or one of the following Advanced Practice Providers on your designated Care Team:    Chanetta Marshall, NP  Tommye Standard, PA-C  Legrand Como "Oda Kilts, [place]    Other Instructions

## 2020-10-20 DIAGNOSIS — R41841 Cognitive communication deficit: Secondary | ICD-10-CM | POA: Diagnosis not present

## 2020-10-20 DIAGNOSIS — J9690 Respiratory failure, unspecified, unspecified whether with hypoxia or hypercapnia: Secondary | ICD-10-CM | POA: Diagnosis not present

## 2020-10-20 DIAGNOSIS — S81801D Unspecified open wound, right lower leg, subsequent encounter: Secondary | ICD-10-CM | POA: Diagnosis not present

## 2020-10-20 DIAGNOSIS — L988 Other specified disorders of the skin and subcutaneous tissue: Secondary | ICD-10-CM | POA: Diagnosis not present

## 2020-10-20 DIAGNOSIS — I4891 Unspecified atrial fibrillation: Secondary | ICD-10-CM | POA: Diagnosis not present

## 2020-10-22 DIAGNOSIS — R41841 Cognitive communication deficit: Secondary | ICD-10-CM | POA: Diagnosis not present

## 2020-10-22 DIAGNOSIS — I4891 Unspecified atrial fibrillation: Secondary | ICD-10-CM | POA: Diagnosis not present

## 2020-10-22 DIAGNOSIS — J9690 Respiratory failure, unspecified, unspecified whether with hypoxia or hypercapnia: Secondary | ICD-10-CM | POA: Diagnosis not present

## 2020-10-27 DIAGNOSIS — S81801D Unspecified open wound, right lower leg, subsequent encounter: Secondary | ICD-10-CM | POA: Diagnosis not present

## 2020-10-27 DIAGNOSIS — L988 Other specified disorders of the skin and subcutaneous tissue: Secondary | ICD-10-CM | POA: Diagnosis not present

## 2020-10-28 DIAGNOSIS — J9690 Respiratory failure, unspecified, unspecified whether with hypoxia or hypercapnia: Secondary | ICD-10-CM | POA: Diagnosis not present

## 2020-10-28 DIAGNOSIS — R41841 Cognitive communication deficit: Secondary | ICD-10-CM | POA: Diagnosis not present

## 2020-10-28 DIAGNOSIS — I4891 Unspecified atrial fibrillation: Secondary | ICD-10-CM | POA: Diagnosis not present

## 2020-10-29 DIAGNOSIS — I4891 Unspecified atrial fibrillation: Secondary | ICD-10-CM | POA: Diagnosis not present

## 2020-10-29 DIAGNOSIS — J9690 Respiratory failure, unspecified, unspecified whether with hypoxia or hypercapnia: Secondary | ICD-10-CM | POA: Diagnosis not present

## 2020-10-29 DIAGNOSIS — R41841 Cognitive communication deficit: Secondary | ICD-10-CM | POA: Diagnosis not present

## 2020-10-30 DIAGNOSIS — R41841 Cognitive communication deficit: Secondary | ICD-10-CM | POA: Diagnosis not present

## 2020-10-30 DIAGNOSIS — J9690 Respiratory failure, unspecified, unspecified whether with hypoxia or hypercapnia: Secondary | ICD-10-CM | POA: Diagnosis not present

## 2020-10-30 DIAGNOSIS — I4891 Unspecified atrial fibrillation: Secondary | ICD-10-CM | POA: Diagnosis not present

## 2020-12-07 ENCOUNTER — Ambulatory Visit (INDEPENDENT_AMBULATORY_CARE_PROVIDER_SITE_OTHER): Payer: Medicare Other

## 2020-12-07 DIAGNOSIS — I4821 Permanent atrial fibrillation: Secondary | ICD-10-CM

## 2020-12-09 LAB — CUP PACEART REMOTE DEVICE CHECK
Battery Remaining Longevity: 159 mo
Battery Voltage: 3.08 V
Brady Statistic AP VP Percent: 0 %
Brady Statistic AP VS Percent: 0 %
Brady Statistic AS VP Percent: 25.93 %
Brady Statistic AS VS Percent: 74.07 %
Brady Statistic RA Percent Paced: 0 %
Brady Statistic RV Percent Paced: 25.93 %
Date Time Interrogation Session: 20220207000043
Implantable Lead Implant Date: 20100409
Implantable Lead Implant Date: 20100409
Implantable Lead Location: 753859
Implantable Lead Location: 753860
Implantable Lead Model: 5076
Implantable Lead Model: 5092
Implantable Pulse Generator Implant Date: 20200917
Lead Channel Impedance Value: 361 Ohm
Lead Channel Impedance Value: 437 Ohm
Lead Channel Impedance Value: 532 Ohm
Lead Channel Impedance Value: 589 Ohm
Lead Channel Pacing Threshold Amplitude: 0.75 V
Lead Channel Pacing Threshold Pulse Width: 0.4 ms
Lead Channel Sensing Intrinsic Amplitude: 0.5 mV
Lead Channel Sensing Intrinsic Amplitude: 8.5 mV
Lead Channel Sensing Intrinsic Amplitude: 8.5 mV
Lead Channel Setting Pacing Amplitude: 2.5 V
Lead Channel Setting Pacing Pulse Width: 0.4 ms
Lead Channel Setting Sensing Sensitivity: 2 mV

## 2020-12-14 NOTE — Progress Notes (Signed)
Remote pacemaker transmission.   

## 2021-03-05 ENCOUNTER — Other Ambulatory Visit: Payer: Self-pay | Admitting: *Deleted

## 2021-03-05 DIAGNOSIS — I739 Peripheral vascular disease, unspecified: Secondary | ICD-10-CM

## 2021-03-08 ENCOUNTER — Ambulatory Visit (INDEPENDENT_AMBULATORY_CARE_PROVIDER_SITE_OTHER): Payer: Medicare Other

## 2021-03-08 DIAGNOSIS — I4821 Permanent atrial fibrillation: Secondary | ICD-10-CM | POA: Diagnosis not present

## 2021-03-09 LAB — CUP PACEART REMOTE DEVICE CHECK
Battery Remaining Longevity: 156 mo
Battery Voltage: 3.06 V
Brady Statistic AP VP Percent: 0 %
Brady Statistic AP VS Percent: 0 %
Brady Statistic AS VP Percent: 21.39 %
Brady Statistic AS VS Percent: 78.61 %
Brady Statistic RA Percent Paced: 0 %
Brady Statistic RV Percent Paced: 21.39 %
Date Time Interrogation Session: 20220509043903
Implantable Lead Implant Date: 20100409
Implantable Lead Implant Date: 20100409
Implantable Lead Location: 753859
Implantable Lead Location: 753860
Implantable Lead Model: 5076
Implantable Lead Model: 5092
Implantable Pulse Generator Implant Date: 20200917
Lead Channel Impedance Value: 361 Ohm
Lead Channel Impedance Value: 418 Ohm
Lead Channel Impedance Value: 513 Ohm
Lead Channel Impedance Value: 589 Ohm
Lead Channel Pacing Threshold Amplitude: 1 V
Lead Channel Pacing Threshold Pulse Width: 0.4 ms
Lead Channel Sensing Intrinsic Amplitude: 0.5 mV
Lead Channel Sensing Intrinsic Amplitude: 7.5 mV
Lead Channel Sensing Intrinsic Amplitude: 7.5 mV
Lead Channel Setting Pacing Amplitude: 2.5 V
Lead Channel Setting Pacing Pulse Width: 0.4 ms
Lead Channel Setting Sensing Sensitivity: 2 mV

## 2021-03-22 ENCOUNTER — Ambulatory Visit (HOSPITAL_COMMUNITY): Payer: Medicare Other

## 2021-03-22 ENCOUNTER — Encounter: Payer: Medicare Other | Admitting: Surgery

## 2021-03-30 NOTE — Progress Notes (Signed)
Remote pacemaker transmission.   

## 2021-04-19 ENCOUNTER — Encounter (HOSPITAL_COMMUNITY): Payer: Medicare Other

## 2021-04-19 ENCOUNTER — Encounter: Payer: Medicare Other | Admitting: Surgery

## 2021-04-30 DEATH — deceased

## 2022-05-19 ENCOUNTER — Telehealth: Payer: Self-pay | Admitting: Internal Medicine

## 2022-05-19 NOTE — Telephone Encounter (Signed)
Attempted to return call. No answer, LMTCB.  Patient name has been taken out of carelink so they should not receive future calls.   Forwarding to Dr. Lovena Le as Juluis Rainier.

## 2022-05-19 NOTE — Telephone Encounter (Signed)
Pt daughter called and stated that Rochanda Pasted on 04/14/2022.  I have sent a message to HIM.  She stated she was getting calls about the pt heart monitor.

## 2022-05-28 NOTE — Telephone Encounter (Signed)
Thank you for the notification. GT
